# Patient Record
Sex: Female | Born: 1995 | Race: Black or African American | Hispanic: No | Marital: Single | Smoking: Current every day smoker
Health system: Southern US, Community
[De-identification: ages and names within clinical notes are randomized; demographics above are authoritative.]

## PROBLEM LIST (undated history)

## (undated) ENCOUNTER — Inpatient Hospital Stay (HOSPITAL_COMMUNITY): Payer: Self-pay

## (undated) ENCOUNTER — Ambulatory Visit: Admission: EM | Source: Home / Self Care

## (undated) DIAGNOSIS — Z8711 Personal history of peptic ulcer disease: Secondary | ICD-10-CM

## (undated) DIAGNOSIS — A549 Gonococcal infection, unspecified: Secondary | ICD-10-CM

## (undated) DIAGNOSIS — A749 Chlamydial infection, unspecified: Secondary | ICD-10-CM

## (undated) DIAGNOSIS — Z8719 Personal history of other diseases of the digestive system: Secondary | ICD-10-CM

## (undated) DIAGNOSIS — F419 Anxiety disorder, unspecified: Secondary | ICD-10-CM

## (undated) DIAGNOSIS — F329 Major depressive disorder, single episode, unspecified: Secondary | ICD-10-CM

## (undated) DIAGNOSIS — D649 Anemia, unspecified: Secondary | ICD-10-CM

## (undated) DIAGNOSIS — F32A Depression, unspecified: Secondary | ICD-10-CM

## (undated) DIAGNOSIS — Z789 Other specified health status: Secondary | ICD-10-CM

## (undated) HISTORY — DX: Depression, unspecified: F32.A

## (undated) HISTORY — PX: FRACTURE SURGERY: SHX138

## (undated) HISTORY — DX: Anemia, unspecified: D64.9

## (undated) HISTORY — DX: Anxiety disorder, unspecified: F41.9

## (undated) HISTORY — DX: Personal history of peptic ulcer disease: Z87.11

## (undated) HISTORY — DX: Major depressive disorder, single episode, unspecified: F32.9

## (undated) HISTORY — DX: Personal history of other diseases of the digestive system: Z87.19

---
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
## 2016-01-14 ENCOUNTER — Encounter: Payer: Self-pay | Admitting: Obstetrics and Gynecology

## 2016-01-14 ENCOUNTER — Ambulatory Visit (INDEPENDENT_AMBULATORY_CARE_PROVIDER_SITE_OTHER): Payer: Medicaid Other | Admitting: Obstetrics and Gynecology

## 2016-01-14 VITALS — BP 134/73 | HR 79 | Temp 98.6°F | Ht 69.0 in | Wt 187.9 lb

## 2016-01-14 DIAGNOSIS — Z30432 Encounter for removal of intrauterine contraceptive device: Secondary | ICD-10-CM

## 2016-01-14 NOTE — Progress Notes (Signed)
Patient ID: Pamela Jefferson, female   DOB: 11/11/1995, 20 y.o.   MRN: [id number] 20 yo G1P1 here for IUD removal. Patient reports some vaginal irritation and cramping pains. She has a strong desire to have the IUD removed. The IUD has been in place for 1 year  IUD removal A speculum was placed in the vagina.  The cervix was visualized along with IUD strings extending 5 cm from the os.  The strings of the IUD were grasped with a ring Forceps for removal. Some resistance was noted. After a few attempts, the IUD was grasped and removed in its entirety. It appeared to have been located in the lower uterine segment. The patient tolerated the procedure well.  She will use condoms for contraception.  RTC prn

## 2016-01-14 NOTE — Patient Instructions (Signed)
Contraception Choices Contraception (birth control) is the use of any methods or devices to prevent pregnancy. Below are some methods to help avoid pregnancy. HORMONAL METHODS   Contraceptive implant. This is a thin, plastic tube containing progesterone hormone. It does not contain estrogen hormone. Your health care provider inserts the tube in the inner part of the upper arm. The tube can remain in place for up to 3 years. After 3 years, the implant must be removed. The implant prevents the ovaries from releasing an egg (ovulation), thickens the cervical mucus to prevent sperm from entering the uterus, and thins the lining of the inside of the uterus.  Progesterone-only injections. These injections are given every 3 months by your health care provider to prevent pregnancy. This synthetic progesterone hormone stops the ovaries from releasing eggs. It also thickens cervical mucus and changes the uterine lining. This makes it harder for sperm to survive in the uterus.  Birth control pills. These pills contain estrogen and progesterone hormone. They work by preventing the ovaries from releasing eggs (ovulation). They also cause the cervical mucus to thicken, preventing the sperm from entering the uterus. Birth control pills are prescribed by a health care provider.Birth control pills can also be used to treat heavy periods.  Minipill. This type of birth control pill contains only the progesterone hormone. They are taken every day of each month and must be prescribed by your health care provider.  Birth control patch. The patch contains hormones similar to those in birth control pills. It must be changed once a week and is prescribed by a health care provider.  Vaginal ring. The ring contains hormones similar to those in birth control pills. It is left in the vagina for 3 weeks, removed for 1 week, and then a new one is put back in place. The patient must be comfortable inserting and removing the ring  from the vagina.A health care provider's prescription is necessary.  Emergency contraception. Emergency contraceptives prevent pregnancy after unprotected sexual intercourse. This pill can be taken right after sex or up to 5 days after unprotected sex. It is most effective the sooner you take the pills after having sexual intercourse. Most emergency contraceptive pills are available without a prescription. Check with your pharmacist. Do not use emergency contraception as your only form of birth control. BARRIER METHODS   Female condom. This is a thin sheath (latex or rubber) that is worn over the penis during sexual intercourse. It can be used with spermicide to increase effectiveness.  Female condom. This is a soft, loose-fitting sheath that is put into the vagina before sexual intercourse.  Diaphragm. This is a soft, latex, dome-shaped barrier that must be fitted by a health care provider. It is inserted into the vagina, along with a spermicidal jelly. It is inserted before intercourse. The diaphragm should be left in the vagina for 6 to 8 hours after intercourse.  Cervical cap. This is a round, soft, latex or plastic cup that fits over the cervix and must be fitted by a health care provider. The cap can be left in place for up to 48 hours after intercourse.  Sponge. This is a soft, circular piece of polyurethane foam. The sponge has spermicide in it. It is inserted into the vagina after wetting it and before sexual intercourse.  Spermicides. These are chemicals that kill or block sperm from entering the cervix and uterus. They come in the form of creams, jellies, suppositories, foam, or tablets. They do not require a   prescription. They are inserted into the vagina with an applicator before having sexual intercourse. The process must be repeated every time you have sexual intercourse. INTRAUTERINE CONTRACEPTION  Intrauterine device (IUD). This is a T-shaped device that is put in a woman's uterus  during a menstrual period to prevent pregnancy. There are 2 types:  Copper IUD. This type of IUD is wrapped in copper wire and is placed inside the uterus. Copper makes the uterus and fallopian tubes produce a fluid that kills sperm. It can stay in place for 10 years.  Hormone IUD. This type of IUD contains the hormone progestin (synthetic progesterone). The hormone thickens the cervical mucus and prevents sperm from entering the uterus, and it also thins the uterine lining to prevent implantation of a fertilized egg. The hormone can weaken or kill the sperm that get into the uterus. It can stay in place for 3-5 years, depending on which type of IUD is used. PERMANENT METHODS OF CONTRACEPTION  Female tubal ligation. This is when the woman's fallopian tubes are surgically sealed, tied, or blocked to prevent the egg from traveling to the uterus.  Hysteroscopic sterilization. This involves placing a small coil or insert into each fallopian tube. Your doctor uses a technique called hysteroscopy to do the procedure. The device causes scar tissue to form. This results in permanent blockage of the fallopian tubes, so the sperm cannot fertilize the egg. It takes about 3 months after the procedure for the tubes to become blocked. You must use another form of birth control for these 3 months.  Female sterilization. This is when the female has the tubes that carry sperm tied off (vasectomy).This blocks sperm from entering the vagina during sexual intercourse. After the procedure, the man can still ejaculate fluid (semen). NATURAL PLANNING METHODS  Natural family planning. This is not having sexual intercourse or using a barrier method (condom, diaphragm, cervical cap) on days the woman could become pregnant.  Calendar method. This is keeping track of the length of each menstrual cycle and identifying when you are fertile.  Ovulation method. This is avoiding sexual intercourse during ovulation.  Symptothermal  method. This is avoiding sexual intercourse during ovulation, using a thermometer and ovulation symptoms.  Post-ovulation method. This is timing sexual intercourse after you have ovulated. Regardless of which type or method of contraception you choose, it is important that you use condoms to protect against the transmission of sexually transmitted infections (STIs). Talk with your health care provider about which form of contraception is most appropriate for you.   This information is not intended to replace advice given to you by your health care provider. Make sure you discuss any questions you have with your health care provider.   Document Released: 09/05/2005 Document Revised: 09/10/2013 Document Reviewed: 02/28/2013 Elsevier Interactive Patient Education 2016 [employer]  

## 2016-01-19 ENCOUNTER — Encounter (HOSPITAL_COMMUNITY): Payer: Self-pay | Admitting: Emergency Medicine

## 2016-01-19 ENCOUNTER — Emergency Department (HOSPITAL_COMMUNITY)
Admission: EM | Admit: 2016-01-19 | Discharge: 2016-01-20 | Disposition: A | Discharge status: Home / Self Care | Payer: Medicaid Other | Attending: Emergency Medicine | Admitting: Emergency Medicine

## 2016-01-19 DIAGNOSIS — Z046 Encounter for general psychiatric examination, requested by authority: Secondary | ICD-10-CM | POA: Insufficient documentation

## 2016-01-19 DIAGNOSIS — R45851 Suicidal ideations: Secondary | ICD-10-CM | POA: Insufficient documentation

## 2016-01-19 DIAGNOSIS — F329 Major depressive disorder, single episode, unspecified: Secondary | ICD-10-CM | POA: Insufficient documentation

## 2016-01-19 DIAGNOSIS — Z791 Long term (current) use of non-steroidal anti-inflammatories (NSAID): Secondary | ICD-10-CM | POA: Insufficient documentation

## 2016-01-19 DIAGNOSIS — F322 Major depressive disorder, single episode, severe without psychotic features: Secondary | ICD-10-CM | POA: Diagnosis present

## 2016-01-19 DIAGNOSIS — F32A Depression, unspecified: Secondary | ICD-10-CM

## 2016-01-19 DIAGNOSIS — F332 Major depressive disorder, recurrent severe without psychotic features: Secondary | ICD-10-CM | POA: Diagnosis not present

## 2016-01-19 LAB — CBC
HEMATOCRIT: 37.6 % (ref 36.0–46.0)
Hemoglobin: 12.5 g/dL (ref 12.0–15.0)
MCH: 29.6 pg (ref 26.0–34.0)
MCHC: 33.2 g/dL (ref 30.0–36.0)
MCV: 89.1 fL (ref 78.0–100.0)
Platelets: 435 10*3/uL — ABNORMAL HIGH (ref 150–400)
RBC: 4.22 MIL/uL (ref 3.87–5.11)
RDW: 14.1 % (ref 11.5–15.5)
WBC: 10.7 10*3/uL — ABNORMAL HIGH (ref 4.0–10.5)

## 2016-01-19 LAB — COMPREHENSIVE METABOLIC PANEL
ALBUMIN: 4.8 g/dL (ref 3.5–5.0)
ALT: 12 U/L — AB (ref 14–54)
AST: 15 U/L (ref 15–41)
Alkaline Phosphatase: 91 U/L (ref 38–126)
Anion gap: 12 (ref 5–15)
BILIRUBIN TOTAL: 0.7 mg/dL (ref 0.3–1.2)
BUN: 18 mg/dL (ref 6–20)
CHLORIDE: 106 mmol/L (ref 101–111)
CO2: 21 mmol/L — ABNORMAL LOW (ref 22–32)
CREATININE: 0.57 mg/dL (ref 0.44–1.00)
Calcium: 9.8 mg/dL (ref 8.9–10.3)
GFR calc Af Amer: >60 mL/min (ref >60)
GLUCOSE: 96 mg/dL (ref 65–99)
Potassium: 3.8 mmol/L (ref 3.5–5.1)
Sodium: 139 mmol/L (ref 135–145)
Total Protein: 8.5 g/dL — ABNORMAL HIGH (ref 6.5–8.1)

## 2016-01-19 LAB — ETHANOL

## 2016-01-19 LAB — RAPID URINE DRUG SCREEN, HOSP PERFORMED
Amphetamines: NOT DETECTED
BARBITURATES: NOT DETECTED
Benzodiazepines: NOT DETECTED
COCAINE: NOT DETECTED
Opiates: NOT DETECTED
TETRAHYDROCANNABINOL: POSITIVE — AB

## 2016-01-19 LAB — PREGNANCY, URINE: PREG TEST UR: NEGATIVE

## 2016-01-19 MED ORDER — LORAZEPAM 2 MG/ML IJ SOLN
1.0000 mg | Freq: Once | INTRAMUSCULAR | Status: AC
  Administered 2016-01-19: 1 mg via INTRAMUSCULAR
  Filled 2016-01-19: qty 1

## 2016-01-19 MED ORDER — HALOPERIDOL LACTATE 5 MG/ML IJ SOLN
5.0000 mg | Freq: Once | INTRAMUSCULAR | Status: AC
  Administered 2016-01-19: 5 mg via INTRAMUSCULAR
  Filled 2016-01-19: qty 1

## 2016-01-19 MED ORDER — DIPHENHYDRAMINE HCL 50 MG/ML IJ SOLN
12.5000 mg | Freq: Once | INTRAMUSCULAR | Status: AC
Start: 2016-01-19 — End: 2016-01-19
  Administered 2016-01-19: 12.5 mg via INTRAMUSCULAR
  Filled 2016-01-19: qty 1

## 2016-01-19 NOTE — ED Notes (Signed)
Pt resting at present, no distress noted. Respirations even and unlabored.  Skin color good.  Monitoring for safety, Q 15 min checks in effect.

## 2016-01-19 NOTE — ED Notes (Signed)
[facility] nurse unavailable and is currently with a patient

## 2016-01-19 NOTE — ED Notes (Signed)
[id number] Expected date:  Expected time:  Means of arrival:  Comments: [place]

## 2016-01-19 NOTE — ED Notes (Signed)
On admission pt became very upset about being here. She demanded to be released. She screamed and yelled that she will not stay. She ran to the exit doors and hit them repeatedly with her fist. A show of support of staff did not calm her down. Pt status changed to IVC and medications given emergently to help her calm down and stop her from hurting herself or others. After the medications were given she calmed down and called her family.

## 2016-01-19 NOTE — ED Notes (Addendum)
[employer] got a call saying pt was threatening suicide by hanging. Pt was on the couch with wires next to her. Voluntary currently. Pt c/o being overwhelmed trying to take care of her 19 year old and may also currently be pregnant.  Pt states she doesn't know if she's pregnant at the moment. Suicidal thoughts with plans of hanging. Denies homicidal. Denies any physical problems/pain.

## 2016-01-19 NOTE — ED Notes (Signed)
Spoke with Dr. Justin MendStinyl who is in the process of changing pt's status to Involuntary Commitment.

## 2016-01-19 NOTE — ED Provider Notes (Signed)
CSN: [id number]     Arrival date & time 01/19/16  1220 History   First MD Initiated Contact with Patient 01/19/16 1255     No chief complaint on file.    (Consider location/radiation/quality/duration/timing/severity/associated sxs/prior Treatment) The history is provided by the patient.  Patient c/o feeling depressed, with thoughts of suicide.  Patient notes plan of using wires from her car to hang herself. Denies any attempt at self harm.  Patient called for help today, arrives via ems.  Denies current tx for same. Denies any prescription or otc med use.  Patient indicates is feeling overwhelmed with caring for her 19 year old, and being afraid she may be pregnant again.  States had iud removed 1 month ago. No abd pain. No vaginal discharge or bleeding. Denies any current physical symptoms/illness. +trouble sleeping at night. Is eating/drinking. No wt loss. Denies etoh or drug use.       Past Medical History  Diagnosis Date  . Medical history non-contributory   . Gonorrhea   . Chlamydia    Past Surgical History  Procedure Laterality Date  . Fracture surgery      left arm   No family history on file. Social History  Substance Use Topics  . Smoking status: Never Smoker   . Smokeless tobacco: Never Used  . Alcohol Use: No   OB History    Gravida Para Term Preterm AB TAB SAB Ectopic Multiple Living   0 1     Review of Systems  Constitutional: Negative for fever.  HENT: Negative for sore throat.   Eyes: Negative for redness.  Respiratory: Negative for shortness of breath.   Cardiovascular: Negative for chest pain.  Gastrointestinal: Negative for vomiting and abdominal pain.  Genitourinary: Negative for flank pain.  Musculoskeletal: Negative for back pain and neck pain.  Skin: Negative for rash.  Neurological: Negative for headaches.  Hematological: Does not bruise/bleed easily.  Psychiatric/Behavioral: Positive for suicidal ideas and dysphoric mood.       Allergies  Shellfish allergy  Home Medications   Prior to Admission medications   Medication Sig Start Date End Date Taking? Authorizing Provider  ibuprofen (ADVIL,MOTRIN) 600 MG tablet Take 1 tablet (600 mg total) by mouth every 6 (six) hours as needed. Patient not taking: Reported on 01/19/2016 11/18/15   Mady Gemma, PA-C   BP 117/73 mmHg  Pulse 85  Temp(Src) 98.5 F (36.9 C) (Oral)  Resp 16  Ht  (1.727 m)  Wt 84.823 kg  BMI 28.44 kg/m2  SpO2 100%  LMP 12/28/2015 Physical Exam  Constitutional: She is oriented to person, place, and time. She appears well-developed and well-nourished. No distress.  HENT:  Head: Atraumatic.  Mouth/Throat: Oropharynx is clear and moist.  Eyes: Conjunctivae are normal. Pupils are equal, round, and reactive to light. No scleral icterus.  Neck: Neck supple. No tracheal deviation present. No thyromegaly present.  Cardiovascular: Normal rate, regular rhythm, normal heart sounds and intact distal pulses.   No murmur heard. Pulmonary/Chest: Effort normal and breath sounds normal. No respiratory distress.  Abdominal: Soft. Normal appearance and bowel sounds are normal. She exhibits no distension. There is no tenderness.  Musculoskeletal: She exhibits no edema.  Neurological: She is alert and oriented to person, place, and time.  Steady gait.   Skin: Skin is warm and dry. No rash noted. She is not diaphoretic.  Psychiatric:  Depressed mood. +SI.    Nursing note and vitals reviewed.  ED Course  Procedures (including critical care time) Labs Review  Results for orders placed or performed during the hospital encounter of 01/19/16  Comprehensive metabolic panel  Result Value Ref Range   Sodium 139 135 - 145 mmol/L   Potassium 3.8 3.5 - 5.1 mmol/L   Chloride 106 101 - 111 mmol/L   CO2 21 (L) 22 - 32 mmol/L   Glucose, Bld 96 65 - 99 mg/dL   BUN 18 6 - 20 mg/dL   Creatinine, Ser 1.610.57 0.44 - 1.00 mg/dL   Calcium 9.8 8.9 -  09.610.3 mg/dL   Total Protein 8.5 (H) 6.5 - 8.1 g/dL   Albumin 4.8 3.5 - 5.0 g/dL   AST 15 15 - 41 U/L   ALT 12 (L) 14 - 54 U/L   Alkaline Phosphatase 91 38 - 126 U/L   Total Bilirubin 0.7 0.3 - 1.2 mg/dL   GFR calc non Af Amer >60 >60 mL/min   GFR calc Af Amer >60 >60 mL/min   Anion gap 12 5 - 15  Ethanol  Result Value Ref Range   Alcohol, Ethyl (B) <5 <5 mg/dL  CBC  Result Value Ref Range   WBC 10.7 (H) 4.0 - 10.5 K/uL   RBC 4.22 3.87 - 5.11 MIL/uL   Hemoglobin 12.5 12.0 - 15.0 g/dL   HCT 04.537.6 40.936.0 - 81.146.0 %   MCV 89.1 78.0 - 100.0 fL   MCH 29.6 26.0 - 34.0 pg   MCHC 33.2 30.0 - 36.0 g/dL   RDW 91.414.1 78.211.5 - 95.615.5 %   Platelets 435 (H) 150 - 400 K/uL  Urine rapid drug screen (hosp performed)  Result Value Ref Range   Opiates NONE DETECTED NONE DETECTED   Cocaine NONE DETECTED NONE DETECTED   Benzodiazepines NONE DETECTED NONE DETECTED   Amphetamines NONE DETECTED NONE DETECTED   Tetrahydrocannabinol POSITIVE (A) NONE DETECTED   Barbiturates NONE DETECTED NONE DETECTED  Pregnancy, urine  Result Value Ref Range   Preg Test, Ur NEGATIVE NEGATIVE      I have personally reviewed and evaluated these lab results as part of my medical decision-making.    MDM    Labs.  [employer]/patient verify her 19 yr old is with her mother/being care for appropriately.   Reviewed nursing notes and prior charts for additional history.   Labs reviewed.  Patient appears medically clear for psych eval.  [facility]   [facility] eval remains pending[facility]  [facility] - anticipate will need psych admission.      Cathren LaineKevin Tyja Gortney, MD 01/19/16 [id number]

## 2016-01-19 NOTE — ED Notes (Signed)
First attempt made to call report to [facility], but nurse currently unavailable

## 2016-01-19 NOTE — BH Assessment (Signed)
Assessment Note  Pamela Jefferson is an 20 y.o. female. Patient reports suicidal thoughts with plan to hang herself. Patient plans to take the cable cords from her car. She then plans to go inside her closet and hang herself. Patient denies previous attempts or gestures to harm self. No self mutilating behaviors. Patients stressors include job loss due to not having a sitter for her child and missing to many days, raising a 19 yr old with not support, financial issues, and conflict with boyfriend. Patient reports increased depression for the past 2-3 days. She reports symptoms of crying spells, anger, isolating from others, and hopelessness. Patient denies HI and AVH's. Patient calm and cooperative. No legal issues. Patient denies alcohol use. She also denies drug use but UDS is + for THC. Patient denies history of inpatient and outpatient psychiatric treatment.      Diagnosis: Major Depressive Disorder, Recurrent, Severe, without psychotic features  Past Medical History:  Past Medical History  Diagnosis Date  . Medical history non-contributory   . Gonorrhea   . Chlamydia     Past Surgical History  Procedure Laterality Date  . Fracture surgery      left arm    Family History: History reviewed. No pertinent family history.  Social History:  reports that she has never smoked. She has never used smokeless tobacco. She reports that she does not drink alcohol or use illicit drugs.  Additional Social History:  Alcohol / Drug Use Pain Medications: SEE MAR Prescriptions: SEE MAR Over the Counter: SEE MAR History of alcohol / drug use?: Yes Substance #1 Name of Substance 1: THC (patient denies use; UDS + for THC) 1 - Age of First Use: unk 1 - Amount (size/oz): unk 1 - Frequency: unk 1 - Duration: unk 1 - Last Use / Amount: unk  CIWA: CIWA-Ar BP: 117/73 mmHg Pulse Rate: 85 COWS:    Allergies:  Allergies  Allergen Reactions  . Shellfish Allergy Anaphylaxis    Home Medications:   (Not in a hospital admission)  OB/GYN Status:  Patient's last menstrual period was 12/28/2015.  General Assessment Data Location of Assessment: [facility] TTS Assessment: In system Is this a Tele or Face-to-Face Assessment?: Face-to-Face Is this an Initial Assessment or a Re-assessment for this encounter?: Initial Assessment Marital status: Single Maiden name:  (n/a) Is patient pregnant?: No Pregnancy Status: No Living Arrangements: Other (Comment), Spouse/significant other, Children (with boyfriend and 67 year old son) Can pt return to current living arrangement?: Yes Admission Status: Involuntary Is patient capable of signing voluntary admission?: Yes Referral Source: Self/Family/Friend Insurance type:  (Medicaid )     Crisis Care Plan Living Arrangements: Other (Comment), Spouse/significant other, Children (with boyfriend and 12 year old son) Legal Guardian:  (no guardian ) Name of Psychiatrist:  (no psychiatrist ) Name of Therapist:  (no therapist )  Education Status Is patient currently in school?: No Current Grade:  (n/a) Highest grade of school patient has completed:  (HS) Name of school:  (n/a) Contact person:  (n/a)  Risk to self with the past 6 months Suicidal Ideation: Yes-Currently Present Has patient been a risk to self within the past 6 months prior to admission? : Yes Suicidal Intent: Yes-Currently Present Has patient had any suicidal intent within the past 6 months prior to admission? : Yes Is patient at risk for suicide?: Yes Suicidal Plan?: Yes-Currently Present Has patient had any suicidal plan within the past 6 months prior to admission? : No Specify Current Suicidal Plan:  (  hang self in closet by using cords under her cars hood) Access to Means: Yes Specify Access to Suicidal Means:  ("I will use the cords under the hood of my car") What has been your use of drugs/alcohol within the last 12 months?:  (denies; uds + for thc ) Previous Attempts/Gestures:  Yes How many times?:  (patient denies previous attempts or gestures ) Other Self Harm Risks:  (patient denies ) Triggers for Past Attempts:  (denies ) Intentional Self Injurious Behavior: None Family Suicide History: No Recent stressful life event(s): Divorce, Loss (Comment), Financial Problems, Legal Issues, Trauma (Comment), Turmoil (Comment), Other (Comment), Job Loss, Recent negative physical changes, Conflict (Comment) Persecutory voices/beliefs?: No Depression: Yes Depression Symptoms: Feeling angry/irritable, Feeling worthless/self pity, Loss of interest in usual pleasures, Guilt, Fatigue, Isolating, Tearfulness, Insomnia, Despondent Substance abuse history and/or treatment for substance abuse?: No Suicide prevention information given to non-admitted patients: Not applicable  Risk to Others within the past 6 months Homicidal Ideation: No Does patient have any lifetime risk of violence toward others beyond the six months prior to admission? : No Thoughts of Harm to Others: No Current Homicidal Intent: No Current Homicidal Plan: No Access to Homicidal Means: No Identified Victim:  (n/a) History of harm to others?: No Assessment of Violence: None Noted Violent Behavior Description:  (patient is calm and cooperative ) Does patient have access to weapons?: No Criminal Charges Pending?: No Does patient have a court date: No Is patient on probation?: No  Psychosis Hallucinations: None noted Delusions: None noted  Mental Status Report Appearance/Hygiene: In scrubs Eye Contact: Fair Motor Activity: Agitation Speech: Logical/coherent Level of Consciousness: Alert Mood: Depressed Affect: Depressed Anxiety Level: None Thought Processes: Coherent, Relevant Judgement: Impaired Orientation: Person, Place, Time, Situation Obsessive Compulsive Thoughts/Behaviors: None  Cognitive Functioning Concentration: Decreased Memory: Recent Intact, Remote Intact IQ: Average Insight:  Poor Impulse Control: Poor Appetite: Poor Weight Loss:  (varies ) Weight Gain:  (varies ) Sleep: Decreased Total Hours of Sleep:  (varies ) Vegetative Symptoms: None  ADLScreening [facility] Assessment Services) Patient's cognitive ability adequate to safely complete daily activities?: Yes Patient able to express need for assistance with ADLs?: Yes Independently performs ADLs?: Yes (appropriate for developmental age)  Prior Inpatient Therapy Prior Inpatient Therapy: No Prior Therapy Dates:  (n/a) Prior Therapy Facilty/Provider(s):  (n/a) Reason for Treatment:  (n/a)  Prior Outpatient Therapy Prior Outpatient Therapy: No Prior Therapy Dates:  (n/a) Prior Therapy Facilty/Provider(s):  (n/a) Reason for Treatment:  (n/a) Does patient have an ACCT team?: No Does patient have Intensive In-House Services?  : No Does patient have [facility] services? : No Does patient have P4CC services?: No  ADL Screening (condition at time of admission) Patient's cognitive ability adequate to safely complete daily activities?: Yes Is the patient deaf or have difficulty hearing?: No Does the patient have difficulty seeing, even when wearing glasses/contacts?: No Does the patient have difficulty concentrating, remembering, or making decisions?: No Patient able to express need for assistance with ADLs?: Yes Does the patient have difficulty dressing or bathing?: No Independently performs ADLs?: Yes (appropriate for developmental age) Does the patient have difficulty walking or climbing stairs?: No Weakness of Legs: None Weakness of Arms/Hands: None  Home Assistive Devices/Equipment Home Assistive Devices/Equipment: None    Abuse/Neglect Assessment (Assessment to be complete while patient is alone) Physical Abuse: Denies Verbal Abuse: Denies Sexual Abuse: Denies Exploitation of patient/patient's resources: Denies Self-Neglect: Denies Values / Beliefs Cultural Requests During Hospitalization:  None Spiritual Requests During Hospitalization: None  Advance Directives (For Healthcare) Does patient have an advance directive?: No Would patient like information on creating an advanced directive?: No - patient declined information Nutrition Screen- [facility] Patient's home diet: Regular  Additional Information 1:1 In Past 12 Months?: No CIRT Risk: No Elopement Risk: No Does patient have medical clearance?: No     Disposition:  Disposition Initial Assessment Completed for this Encounter: Yes Disposition of Patient: Inpatient treatment program (Patient meets criteria for INPT admission, per Julieanne CottonJosephine NP) Type of inpatient treatment program: Adult  On Site Evaluation by:   Reviewed with Physician:    Melynda RipplePerry, Brendyn [facility] 01/19/2016 5:37 PM

## 2016-01-20 ENCOUNTER — Encounter (HOSPITAL_COMMUNITY): Payer: Self-pay

## 2016-01-20 ENCOUNTER — Inpatient Hospital Stay (HOSPITAL_COMMUNITY)
Admission: AD | Admit: 2016-01-20 | Discharge: 2016-01-23 | DRG: 885 | Disposition: A | Discharge status: Home / Self Care | Payer: Medicaid Other | Source: Intra-hospital | Attending: Psychiatry | Admitting: Psychiatry

## 2016-01-20 DIAGNOSIS — G4700 Insomnia, unspecified: Secondary | ICD-10-CM | POA: Diagnosis present

## 2016-01-20 DIAGNOSIS — Z91013 Allergy to seafood: Secondary | ICD-10-CM | POA: Diagnosis not present

## 2016-01-20 DIAGNOSIS — F322 Major depressive disorder, single episode, severe without psychotic features: Secondary | ICD-10-CM | POA: Diagnosis present

## 2016-01-20 DIAGNOSIS — F419 Anxiety disorder, unspecified: Secondary | ICD-10-CM | POA: Diagnosis present

## 2016-01-20 DIAGNOSIS — R45851 Suicidal ideations: Secondary | ICD-10-CM | POA: Diagnosis present

## 2016-01-20 DIAGNOSIS — F332 Major depressive disorder, recurrent severe without psychotic features: Secondary | ICD-10-CM

## 2016-01-20 DIAGNOSIS — F329 Major depressive disorder, single episode, unspecified: Secondary | ICD-10-CM | POA: Diagnosis not present

## 2016-01-20 MED ORDER — TRAZODONE HCL 50 MG PO TABS: 50.0000 mg | ORAL_TABLET | Freq: Every day | ORAL | Status: DC

## 2016-01-20 MED ORDER — FLUOXETINE HCL 10 MG PO CAPS: 10.0000 mg | ORAL_CAPSULE | Freq: Every day | ORAL | Status: DC

## 2016-01-20 MED ORDER — FLUOXETINE HCL 10 MG PO CAPS
10.0000 mg | ORAL_CAPSULE | Freq: Every day | ORAL | Status: DC
  Administered 2016-01-21 – 2016-01-22 (×2): 10 mg via ORAL
  Filled 2016-01-20 (×4): qty 1

## 2016-01-20 MED ORDER — HYDROXYZINE HCL 25 MG PO TABS: 25.0000 mg | ORAL_TABLET | Freq: Three times a day (TID) | ORAL | Status: DC | PRN

## 2016-01-20 MED ORDER — TRAZODONE HCL 50 MG PO TABS
50.0000 mg | ORAL_TABLET | Freq: Every day | ORAL | Status: DC
  Administered 2016-01-20 – 2016-01-22 (×3): 50 mg via ORAL
  Filled 2016-01-20 (×6): qty 1

## 2016-01-20 MED ORDER — HYDROXYZINE HCL 25 MG PO TABS
25.0000 mg | ORAL_TABLET | Freq: Three times a day (TID) | ORAL | Status: DC | PRN
  Administered 2016-01-22: 25 mg via ORAL
  Filled 2016-01-20: qty 1

## 2016-01-20 NOTE — Tx Team (Signed)
Initial Interdisciplinary Treatment Plan   PATIENT STRESSORS: Financial difficulties Marital or family conflict Recent move to the area   PATIENT STRENGTHS: Capable of independent living Communication skills Physical Health   PROBLEM LIST: Problem List/Patient Goals Date to be addressed Date deferred Reason deferred Estimated date of resolution  Depression 01/20/16     Suicide attempt 01/20/16     Anxiety 01/20/16     "Learn ways to help me not stress out so much " 01/20/16     "Feel better about myself " 01/20/16                              DISCHARGE CRITERIA:  Improved stabilization in mood, thinking, and/or behavior Verbal commitment to aftercare and medication compliance  PRELIMINARY DISCHARGE PLAN: Outpatient therapy Medication management  PATIENT/FAMIILY INVOLVEMENT: This treatment plan has been presented to and reviewed with the patient, Pamela Jefferson.  The patient and family have been given the opportunity to ask questions and make suggestions.  Norm ParcelHeather V Berma Harts 01/20/2016, 2:19 PM

## 2016-01-20 NOTE — Consult Note (Signed)
[facility] Face-to-Face Psychiatry Consult   Reason for Consult:  Suicide ideation, Suicide attempt by report Referring Physician:  EDP Patient Identification: Pamela Jefferson MRN:  [id number] Principal Diagnosis: Severe major depression without psychotic features [facility]) Diagnosis:   Patient Active Problem List   Diagnosis Date Noted  . Severe major depression without psychotic features ([place]) [F32.2] 01/20/2016    Total Time spent with patient: 45 minutes  Subjective:   Pamela Jefferson is a 20 y.o. female patient admitted with  Suicide ideation, Suicide attempt.  HPI:  AA female, 20 years old was evaluated with the c/o suicidal ideation with plans to hang her self.  She was IVC by the Psychiatrist as she want to leave.  Patient on arrival to the ER stated that she had attempted to kill her self by hanging the previous week.  She stated that she has some stressors related to her family members.  Patient was tearful and did not want to ne admitted.  Based on report of previous suicide attempt and calling the Police stating she wanted to hang herself she was accepted for admission.  Past Psychiatric History: denies  Risk to Self: Suicidal Ideation: Yes-Currently Present Suicidal Intent: Yes-Currently Present Is patient at risk for suicide?: Yes Suicidal Plan?: Yes-Currently Present Specify Current Suicidal Plan:  (hang self in closet by using cords under her cars hood) Access to Means: Yes Specify Access to Suicidal Means:  ("I will use the cords under the hood of my car") What has been your use of drugs/alcohol within the last 12 months?:  (denies; uds + for thc ) How many times?:  (patient denies previous attempts or gestures ) Other Self Harm Risks:  (patient denies ) Triggers for Past Attempts:  (denies ) Intentional Self Injurious Behavior: None Risk to Others: Homicidal Ideation: No Thoughts of Harm to Others: No Current Homicidal Intent: No Current Homicidal Plan: No Access to  Homicidal Means: No Identified Victim:  (n/a) History of harm to others?: No Assessment of Violence: None Noted Violent Behavior Description:  (patient is calm and cooperative ) Does patient have access to weapons?: No Criminal Charges Pending?: No Does patient have a court date: No Prior Inpatient Therapy: Prior Inpatient Therapy: No Prior Therapy Dates:  (n/a) Prior Therapy Facilty/Provider(s):  (n/a) Reason for Treatment:  (n/a) Prior Outpatient Therapy: Prior Outpatient Therapy: No Prior Therapy Dates:  (n/a) Prior Therapy Facilty/Provider(s):  (n/a) Reason for Treatment:  (n/a) Does patient have an ACCT team?: No Does patient have Intensive In-House Services?  : No Does patient have [facility] services? : No Does patient have P4CC services?: No  Past Medical History:  Past Medical History  Diagnosis Date  . Medical history non-contributory   . Gonorrhea   . Chlamydia     Past Surgical History  Procedure Laterality Date  . Fracture surgery      left arm   Family History: History reviewed. No pertinent family history.   Family Psychiatric  History: denies Social History:  History  Alcohol Use No     History  Drug Use No    Social History   Social History  . Marital Status: Single    Spouse Name: N/A  . Number of Children: N/A  . Years of Education: N/A   Social History Main Topics  . Smoking status: Never Smoker   . Smokeless tobacco: Never Used  . Alcohol Use: No  . Drug Use: No  . Sexual Activity: Yes    Birth Control/ Protection: None, IUD  Other Topics Concern  . None   Social History Narrative   Additional Social History:    Allergies:   Allergies  Allergen Reactions  . Shellfish Allergy Anaphylaxis    Labs:  Results for orders placed or performed during the hospital encounter of 01/19/16 (from the past 48 hour(s))  Urine rapid drug screen (hosp performed)     Status: Abnormal   Collection Time: 01/19/16  1:39 PM  Result Value Ref  Range   Opiates NONE DETECTED NONE DETECTED   Cocaine NONE DETECTED NONE DETECTED   Benzodiazepines NONE DETECTED NONE DETECTED   Amphetamines NONE DETECTED NONE DETECTED   Tetrahydrocannabinol POSITIVE (A) NONE DETECTED   Barbiturates NONE DETECTED NONE DETECTED    Comment:        DRUG SCREEN FOR MEDICAL PURPOSES ONLY.  IF CONFIRMATION IS NEEDED FOR ANY PURPOSE, NOTIFY LAB WITHIN 5 DAYS.        LOWEST DETECTABLE LIMITS FOR URINE DRUG SCREEN Drug Class       Cutoff (ng/mL) Amphetamine      1000 Barbiturate      200 Benzodiazepine   161 Tricyclics       096 Opiates          300 Cocaine          300 THC              50   Pregnancy, urine     Status: None   Collection Time: 01/19/16  1:39 PM  Result Value Ref Range   Preg Test, Ur NEGATIVE NEGATIVE    Comment:        THE SENSITIVITY OF THIS METHODOLOGY IS >20 mIU/mL.   Comprehensive metabolic panel     Status: Abnormal   Collection Time: 01/19/16  1:44 PM  Result Value Ref Range   Sodium 139 135 - 145 mmol/L   Potassium 3.8 3.5 - 5.1 mmol/L   Chloride 106 101 - 111 mmol/L   CO2 21 (L) 22 - 32 mmol/L   Glucose, Bld 96 65 - 99 mg/dL   BUN 18 6 - 20 mg/dL   Creatinine, Ser 0.57 0.44 - 1.00 mg/dL   Calcium 9.8 8.9 - 10.3 mg/dL   Total Protein 8.5 (H) 6.5 - 8.1 g/dL   Albumin 4.8 3.5 - 5.0 g/dL   AST 15 15 - 41 U/L   ALT 12 (L) 14 - 54 U/L   Alkaline Phosphatase 91 38 - 126 U/L   Total Bilirubin 0.7 0.3 - 1.2 mg/dL   GFR calc non Af Amer >60 >60 mL/min   GFR calc Af Amer >60 >60 mL/min    Comment: (NOTE) The eGFR has been calculated using the CKD EPI equation. This calculation has not been validated in all clinical situations. eGFR's persistently <60 mL/min signify possible Chronic Kidney Disease.    Anion gap 12 5 - 15  Ethanol     Status: None   Collection Time: 01/19/16  1:44 PM  Result Value Ref Range   Alcohol, Ethyl (B) <5 <5 mg/dL    Comment:        LOWEST DETECTABLE LIMIT FOR SERUM ALCOHOL IS 5  mg/dL FOR MEDICAL PURPOSES ONLY   CBC     Status: Abnormal   Collection Time: 01/19/16  1:44 PM  Result Value Ref Range   WBC 10.7 (H) 4.0 - 10.5 K/uL   RBC 4.22 3.87 - 5.11 MIL/uL   Hemoglobin 12.5 12.0 - 15.0 g/dL   HCT 37.6 36.0 -  46.0 %   MCV 89.1 78.0 - 100.0 fL   MCH 29.6 26.0 - 34.0 pg   MCHC 33.2 30.0 - 36.0 g/dL   RDW 14.1 11.5 - 15.5 %   Platelets 435 (H) 150 - 400 K/uL    Current Facility-Administered Medications  Medication Dose Route Frequency Provider Last Rate Last Dose  . FLUoxetine (PROZAC) capsule 10 mg  10 mg Oral Daily Deaken Jurgens, MD   10 mg at 01/20/16 1225  . hydrOXYzine (ATARAX/VISTARIL) tablet 25 mg  25 mg Oral TID PRN Corena Pilgrim, MD      . traZODone (DESYREL) tablet 50 mg  50 mg Oral QHS Corena Pilgrim, MD       Current Outpatient Prescriptions  Medication Sig Dispense Refill  . ibuprofen (ADVIL,MOTRIN) 600 MG tablet Take 1 tablet (600 mg total) by mouth every 6 (six) hours as needed. (Patient not taking: Reported on 01/19/2016) 30 tablet 0    Musculoskeletal: Strength & Muscle Tone: within normal limits Gait & Station: normal Patient leans: N/A  Psychiatric Specialty Exam: Review of Systems  Constitutional: Negative.   Eyes: Negative.   Respiratory: Negative.   Cardiovascular: Negative.   Gastrointestinal: Negative.   Genitourinary: Negative.   Musculoskeletal: Negative.   Skin: Negative.   Neurological: Negative.   Endo/Heme/Allergies: Negative.     Blood pressure 128/68, pulse 99, temperature 98.6 F (37 C), temperature source Oral, resp. rate 18, height 5' 8"  (1.727 m), weight 84.823 kg (187 lb), last menstrual period 12/28/2015, SpO2 100 %, not currently breastfeeding.Body mass index is 28.44 kg/(m^2).  General Appearance: Casual and Fairly Groomed  Eye Contact::  Good  Speech:  Clear and Coherent and Normal Rate  Volume:  Normal  Mood:  Angry, Anxious and Irritable  Affect:  Congruent, Depressed, Flat and Tearful  Thought  Process:  Coherent, Goal Directed and Intact  Orientation:  Full (Time, Place, and Person)  Thought Content:  WDL  Suicidal Thoughts:  No  Homicidal Thoughts:  No  Memory:  Immediate;   Good Recent;   Good Remote;   Good  Judgement:  Poor  Insight:  Shallow  Psychomotor Activity:  Psychomotor Retardation  Concentration:  Fair  Recall:  NA  Fund of Knowledge:Poor  Language: Good  Akathisia:  NA  Handed:  Right  AIMS (if indicated):     Assets:  Desire for Improvement  ADL's:  Intact  Cognition: WNL  Sleep:      Treatment Plan Summary: Daily contact with patient to assess and evaluate symptoms and progress in treatment and Medication management  Disposition:  Accepted for admission and was transferred to [facility] Prozac 10 mg po daily for depression was started, Trazodone 50 mg po QHS and Hydroxyzine 25 mg po every 6 hours as needed for anxiety.  Delfin Gant, NP  PMHNP-BC 01/20/2016 1:00 PM Patient seen face-to-face for psychiatric evaluation, chart reviewed and case discussed with the physician extender and developed treatment plan. Reviewed the information documented and agree with the treatment plan. Corena Pilgrim, MD

## 2016-01-20 NOTE — Progress Notes (Signed)
Adult Psychoeducational Group Note  Date:  01/20/2016 Time:  9:28 PM  Group Topic/Focus:  Wrap-Up Group:   The focus of this group is to help patients review their daily goal of treatment and discuss progress on daily workbooks.  Participation Level:  Minimal  Participation Quality:  Appropriate  Affect:  Appropriate  Cognitive:  Alert  Insight: Appropriate  Engagement in Group:  Lacking  Modes of Intervention:  Discussion  Additional Comments:  Pt is new to the unit stated that her day was a 6/10. Her goal is to get better while she is here.   Kaleen OdeaCOOKE, Ronnett Pullin R 01/20/2016, 9:28 PM

## 2016-01-20 NOTE — Progress Notes (Signed)
D: Pt presents appropriate in affect and pleasant in mood. Pt presents with insight of her future. 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 Pt denies any SI/HI/AVH. Pt is visible and active within the milieu. Pt had no physical complaints.  A: Writer administered scheduled medications to pt, per MD orders. Continued support and availability as needed was extended to this pt. Staff continues to monitor pt with q7815min checks.  R: No adverse drug reactions noted. Pt receptive to treatment. Pt remains safe at this time.

## 2016-01-20 NOTE — BH Assessment (Signed)
[facility] Assessment Progress Note  Per Thedore MinsMojeed Akintayo, MD, this pt requires psychiatric hospitalization at this time.  Lillia AbedLindsay, RN, [facility]; pt is to be transferred at 13:30.  Pt presents under IVC initiated by EDP Cathren LaineKevin Steinl, MD; [facility]  Pt's nurse, Lincoln MaxinOlivette, has been notified, and agrees to call report to [phone].  Pt is to be transported via [employer] enforcement.  Doylene Canninghomas Terance Pomplun, MA Triage Specialist [phone]

## 2016-01-20 NOTE — Progress Notes (Signed)
Pamela Jefferson is an 20 year old female being admitted involuntarily to 400-[facility] from [facility].  She came to the ED with suicidal ideation with plan to hang self with the cable cords from her car.  She has had no previous suicide attempts.  Her stressors are job loss because of not having a sitter for her child and missing many days.  She is raising her 19 year old child alone with no financial support and conflict with her boyfriend.  She reports increasing depression over the past 3 days.  She denies current SI/HI or A/V hallucinations.  "I'm here because I just go so stressed out."  Admission paperwork completed and signed.  Belongings searched and secured in locker # 40 (Sneakers, cell phone, black purse, keys).  Skin assessment completed and no skin issues noted.  Q 15 minute checks initiated for safety.  We will monitor the progress towards her goals.

## 2016-01-21 ENCOUNTER — Encounter (HOSPITAL_COMMUNITY): Payer: Self-pay | Admitting: Psychiatry

## 2016-01-21 DIAGNOSIS — F32A Depression, unspecified: Secondary | ICD-10-CM | POA: Insufficient documentation

## 2016-01-21 DIAGNOSIS — F329 Major depressive disorder, single episode, unspecified: Secondary | ICD-10-CM

## 2016-01-21 DIAGNOSIS — R45851 Suicidal ideations: Secondary | ICD-10-CM

## 2016-01-21 NOTE — [facility] Suicide Risk Assessment (Signed)
[facility]   Nursing information obtained from:  Patient Demographic factors:  Adolescent or young adult, Unemployed Current Mental Status:  NA Loss Factors:  Financial problems / change in socioeconomic status Historical Factors:  Family history of mental illness or substance abuse, Impulsivity Risk Reduction Factors:  Responsible for children under 19 years of age, Living with another person, especially a relative  Total Time spent with patient: 45 minutes Principal Problem: suicidal ideations  Diagnosis:   Patient Active Problem List   Diagnosis Date Noted  . Depression [F32.9]   . Suicidal ideation [R45.851]   . Severe major depression without psychotic features ([facility]) [F32.2] 01/20/2016  . MDD (major depressive disorder), single episode, severe (HCC) [F32.2] 01/20/2016    Continued Clinical Symptoms:  Alcohol Use Disorder Identification Test Final Score (AUDIT): 3 The "Alcohol Use Disorders Identification Test", Guidelines for Use in Primary Care, Second Edition.  World Science writerHealth Organization [facility](WHO). Score between 0-7:  no or low risk or alcohol related problems. Score between 8-15:  moderate risk of alcohol related problems. Score between 16-19:  high risk of alcohol related problems. Score 20 or above:  warrants further diagnostic evaluation for alcohol dependence and treatment.   CLINICAL FACTORS:  20 year old female , reports recent onset suicidal ideations, with thoughts of hanging self. Denies prior psychiatric admissions or history of suicide attempts. Psychosocial stressors include being single mother, employed, with limited support system .     Psychiatric Specialty Exam: ROS  Blood pressure 100/54, pulse 96, temperature 98.8 F (37.1 C), temperature source Oral, resp. rate 16, height 5\' 9"  (1.753 m), weight 182 lb (82.555 kg), last menstrual period 12/28/2015, SpO2 100 %, not currently breastfeeding.Body mass index is 26.86 kg/(m^2).   see  admit note MSE  COGNITIVE FEATURES THAT CONTRIBUTE TO RISK:  Closed-mindedness and Loss of executive function    SUICIDE RISK:   Moderate:  Frequent suicidal ideation with limited intensity, and duration, some specificity in terms of plans, no associated intent, good self-control, limited dysphoria/symptomatology, some risk factors present, and identifiable protective factors, including available and accessible social support.  PLAN OF CARE: Patient will be admitted to inpatient psychiatric unit for stabilization and safety. Will provide and encourage milieu participation. Provide medication management and maked adjustments as needed.  Will follow daily.    I certify that inpatient services furnished can reasonably be expected to improve the patient's condition.   Nehemiah MassedOBOS, FERNANDO, MD 01/21/2016, 1:59 PM

## 2016-01-21 NOTE — [facility] Group Notes (Signed)
[facility] LCSW Group Therapy 01/21/2016 1:15 PM Type of Therapy: Group Therapy Participation Level: Active  Participation Quality: Attentive, Sharing and Supportive  Affect: Depressed and Flat  Cognitive: Alert and Oriented  Insight: Developing/Improving and Engaged  Engagement in Therapy: Developing/Improving and Engaged  Modes of Intervention: Activity, Clarification, Confrontation, Discussion, Education, Exploration, Limit-setting, Orientation, Problem-solving, Rapport Building, Reality Testing, Socialization and Support  Summary of Progress/Problems: Patient was attentive and engaged with speaker from [employer]. Patient was attentive to speaker while they shared their story of dealing with mental health and overcoming it. Patient expressed interest in their programs and services and received information on their agency. Patient processed ways they can relate to the speaker.   Sigurd Pugh, LCSW Clinical Social Worker [facility] [phone]   

## 2016-01-21 NOTE — Progress Notes (Signed)
D: Pt presents anxious on approach. Pt appears cautious and guarded during shift assessment. Pt forwarded little information to Clinical research associatewriter and denies depression and suicidal thoughts. Pt would not discuss feelings with Clinical research associatewriter and appeared animated. Pt appeared disheveled this morning. Pt compliant with taking meds. No adverse reaction to meds verbalized by pt.  A: Medications administered as ordered per MD. Medication reviewed with pt. Verbal support provided. Pt encouraged to attend groups. 15 minute checks performed for safety. R: Pt verbalized understanding of med regimen. Pt stated goal "go home to my son and family".

## 2016-01-21 NOTE — H&P (Signed)
Psychiatric Admission Assessment Adult  Patient Identification: Pamela Jefferson MRN:  [id number] Date of Evaluation:  01/21/2016 Chief Complaint:   " I got really depressed and stressed out " Principal Diagnosis: Suicidal Ideations Diagnosis:   Patient Active Problem List   Diagnosis Date Noted  . Depression [F32.9]   . Suicidal ideation [R45.851]   . Severe major depression without psychotic features ([place]) [F32.2] 01/20/2016  . MDD (major depressive disorder), single episode, severe ([place]) [F32.2] 01/20/2016   History of Present Illness:: 20 year old single female, reports worsening depression, feeling overwhelmed over recent days , and developed suicidal ideations, with thoughts of hanging self . She states she knew she needed help so called 911 and was brought to hospital. Reports significant stressors, being a single mother, working full time, trying to " keep up with the bills and everything I have to do ".  Associated Signs/Symptoms: Depression Symptoms:  depressed mood, recurrent thoughts of death, suicidal thoughts with specific plan,  Of note, minimizes anhedonia, or any significant changes in sleep, appetite, energy level  (Hypo) Manic Symptoms:  Denies  Anxiety Symptoms:  Denies excessive anxiety or panic attacks, denies agoraphobia. Psychotic Symptoms:  Denies  PTSD Symptoms: Denies  Total Time spent with patient: 45 minutes  Past Psychiatric History:  This is her first psychiatric admission, denies history of suicide attempts in the past, denies history of self cutting or self injurious ideations, denies history of psychosis, denies history of mania or hypomania, denies PTSD history. Denies history of eating disorder   Patient states she has never been on any psychiatric medications in the past  Is the patient at risk to self? Yes.    Has the patient been a risk to self in the past 6 months? No.  Has the patient been a risk to self within the distant past? No.  Is the  patient a risk to others? No.  Has the patient been a risk to others in the past 6 months? No.  Has the patient been a risk to others within the distant past? No.   Prior Inpatient Therapy:  denies  Prior Outpatient Therapy:  denies   Alcohol Screening: 1. How often do you have a drink containing alcohol?: 2 to 4 times a month 2. How many drinks containing alcohol do you have on a typical day when you are drinking?: 1 or 2 3. How often do you have six or more drinks on one occasion?: Less than monthly Preliminary Score: 1 9. Have you or someone else been injured as a result of your drinking?: No 10. Has a relative or friend or a doctor or another health worker been concerned about your drinking or suggested you cut down?: No Alcohol Use Disorder Identification Test Final Score (AUDIT): 3 Brief Intervention: AUDIT score less than 7 or less-screening does not suggest unhealthy drinking-brief intervention not indicated Substance Abuse History in the last 12 months:  Denies any alcohol or drug abuse   Consequences of Substance Abuse: Denies  Previous Psychotropic Medications:  No prior psychiatric medications  Psychological Evaluations:  No  Past Medical History: denies medical illnesses, NKDA Past Medical History  Diagnosis Date  . Medical history non-contributory   . Gonorrhea   . Chlamydia     Past Surgical History  Procedure Laterality Date  . Fracture surgery      left arm   Family History: Parents alive, separated, has 4 siblings Family Psychiatric  History: denies mental illness, denies substance abuse, denies suicides in  family  Tobacco Screening: does not smoke  Social History: single, has boyfriend, states she has a good relationship with him, has a one year old son, employed at a Engineer, drilling, denies legal issues, describes some financial difficulties   History  Alcohol Use No     History  Drug Use No    Additional Social History: Marital status: Long term  relationship Long term relationship, how long?: 8 months What types of issues is patient dealing with in the relationship?: boyfriend has different job w less income than previous job, wants patient to pay bills and is not able/willing to contribute financially as he did before Are you sexually active?: Yes What is your sexual orientation?: heterosexual Has your sexual activity been affected by drugs, alcohol, medication, or emotional stress?: no Does patient have children?: Yes How many children?: 1 How is patient's relationship with their children?: good, loves to be mother, "exciting, new things happen every day"; stressful due to son now walking, saying "no"    Pain Medications: SEE MAR Prescriptions: SEE MAR Over the Counter: SEE MAR History of alcohol / drug use?: Yes Name of Substance 1: THC (patient denies use; UDS + for THC) 1 - Age of First Use: unk 1 - Amount (size/oz): unk 1 - Frequency: unk 1 - Duration: unk 1 - Last Use / Amount: unk  Allergies:   Allergies  Allergen Reactions  . Shellfish Allergy Anaphylaxis   Lab Results:  Results for orders placed or performed during the hospital encounter of 01/19/16 (from the past 48 hour(s))  Comprehensive metabolic panel     Status: Abnormal   Collection Time: 01/19/16  1:44 PM  Result Value Ref Range   Sodium 139 135 - 145 mmol/L   Potassium 3.8 3.5 - 5.1 mmol/L   Chloride 106 101 - 111 mmol/L   CO2 21 (L) 22 - 32 mmol/L   Glucose, Bld 96 65 - 99 mg/dL   BUN 18 6 - 20 mg/dL   Creatinine, Ser 0.57 0.44 - 1.00 mg/dL   Calcium 9.8 8.9 - 10.3 mg/dL   Total Protein 8.5 (H) 6.5 - 8.1 g/dL   Albumin 4.8 3.5 - 5.0 g/dL   AST 15 15 - 41 U/L   ALT 12 (L) 14 - 54 U/L   Alkaline Phosphatase 91 38 - 126 U/L   Total Bilirubin 0.7 0.3 - 1.2 mg/dL   GFR calc non Af Amer >60 >60 mL/min   GFR calc Af Amer >60 >60 mL/min    Comment: (NOTE) The eGFR has been calculated using the CKD EPI equation. This calculation has not been  validated in all clinical situations. eGFR's persistently <60 mL/min signify possible Chronic Kidney Disease.    Anion gap 12 5 - 15  Ethanol     Status: None   Collection Time: 01/19/16  1:44 PM  Result Value Ref Range   Alcohol, Ethyl (B) <5 <5 mg/dL    Comment:        LOWEST DETECTABLE LIMIT FOR SERUM ALCOHOL IS 5 mg/dL FOR MEDICAL PURPOSES ONLY   CBC     Status: Abnormal   Collection Time: 01/19/16  1:44 PM  Result Value Ref Range   WBC 10.7 (H) 4.0 - 10.5 K/uL   RBC 4.22 3.87 - 5.11 MIL/uL   Hemoglobin 12.5 12.0 - 15.0 g/dL   HCT 37.6 36.0 - 46.0 %   MCV 89.1 78.0 - 100.0 fL   MCH 29.6 26.0 - 34.0 pg   MCHC  33.2 30.0 - 36.0 g/dL   RDW 14.1 11.5 - 15.5 %   Platelets 435 (H) 150 - 400 K/uL    Blood Alcohol level:  Lab Results  Component Value Date   ETH <5 11/11/3610    Metabolic Disorder Labs:  No results found for: HGBA1C, MPG No results found for: PROLACTIN No results found for: CHOL, TRIG, HDL, CHOLHDL, VLDL, LDLCALC  Current Medications: Current Facility-Administered Medications  Medication Dose Route Frequency Provider Last Rate Last Dose  . FLUoxetine (PROZAC) capsule 10 mg  10 mg Oral Daily Delfin Gant, NP   10 mg at 01/21/16 2449  . hydrOXYzine (ATARAX/VISTARIL) tablet 25 mg  25 mg Oral TID PRN Delfin Gant, NP      . traZODone (DESYREL) tablet 50 mg  50 mg Oral QHS Delfin Gant, NP   50 mg at 01/20/16 2058   PTA Medications: No prescriptions prior to admission    Musculoskeletal: Strength & Muscle Tone: within normal limits Gait & Station: normal Patient leans: N/A  Psychiatric Specialty Exam: Physical Exam  Review of Systems  Constitutional: Negative.   HENT: Negative.   Eyes: Negative.   Respiratory: Negative.   Cardiovascular: Negative.   Gastrointestinal: Negative.   Genitourinary: Negative.   Musculoskeletal: Negative.   Skin: Negative.   Neurological: Negative for seizures.  Endo/Heme/Allergies: Negative.    Psychiatric/Behavioral: Positive for depression and suicidal ideas.  All other systems reviewed and are negative.   Blood pressure 100/54, pulse 96, temperature 98.8 F (37.1 C), temperature source Oral, resp. rate 16, height 5' 9"  (1.753 m), weight 182 lb (82.555 kg), last menstrual period 12/28/2015, SpO2 100 %, not currently breastfeeding.Body mass index is 26.86 kg/(m^2).  General Appearance: Fairly Groomed  Engineer, water::  Good  Speech:  Normal Rate  Volume:  Normal  Mood:  denies depression at this time, states " my mood is pretty good today"  Affect:  Appropriate and reactive   Thought Process:  Linear  Orientation:  Full (Time, Place, and Person)  Thought Content:  denies hallucinations, no delusions   Suicidal Thoughts:  No denies any suicidal ideations or self injurious ideations, contracts for safety on the unit   Homicidal Thoughts:  No denies any homicidal ideations   Memory:  recent and remote grossly intact   Judgement:  Other:  improving   Insight:  improving   Psychomotor Activity:  Normal  Concentration:  Good  Recall:  Good  Fund of Knowledge:Good  Language: Good  Akathisia:  Negative  Handed:  Right  AIMS (if indicated):     Assets:  Communication Skills Desire for Improvement Resilience  ADL's:  Intact  Cognition: WNL  Sleep:  Number of Hours: 6.25     Treatment Plan Summary: Daily contact with patient to assess and evaluate symptoms and progress in treatment, Medication management, Plan inpatient treatment  and medications as below   Observation Level/Precautions:  15 minute checks  Laboratory:  as needed   Psychotherapy:  Milieu, support   Medications:  Prozac 10 mgrs QDAY for depression, Vistaril PRNs for anxiety as needed, Trazodone 50 mgrs QHS PRN for insomnia as needed   Consultations:  As needed   Discharge Concerns: -     Estimated LOS: 3-4 days   Other:     I certify that inpatient services furnished can reasonably be expected to improve  the patient's condition.    Neita Garnet, MD 5/4/20171:40 PM

## 2016-01-21 NOTE — Clinical Social Work Note (Signed)
Phone call from patient's transitional living specialist w [address], [place] [phone]).  Patient has recently been discharged from transitional living program, were in the process [facility] and parenting education.  [facility] does not have therapy available, [facility] however was unable to complete the assessment on scheduled day.  Eduard ClosBethea suggests CSW contact Transitional Living program supervisor, Tera Partridgeawanna Price [phone]) to discuss other community resources/referrals available to patient at discharge.  Santa GeneraAnne Vaniyah Lansky, LCSW Lead Clinical Social Worker Phone:  [phone]

## 2016-01-21 NOTE — BHH Counselor (Signed)
Adult Comprehensive Assessment  Patient ID: Pamela Jefferson, female   DOB: 10/31/1995, 20 y.o.   MRN: [id number]  Information Source: Information source: Patient  Current Stressors:  Educational / Learning stressors: dropped uot in 12th grade due to pregnancy Employment / Job issues: 35 - 40 hours/week at [employer], has difficulty getting hours that coordinate w her child care resources Family Relationships: strong support from mother and grandparents Surveyor, quantityinancial / Lack of resources (include bankruptcy): struggles to pay bills, often has to pay all bills as boyfriend does not have the money Housing / Lack of housing: stable, difficulty payig rent Physical health (include injuries & life threatening diseases): gave birth one year ago at age 20 Social relationships: socially isolated, "I have a hard time letting people in' Substance abuse: denies, positive UDS for THC per record Bereavement / Loss: no issues  Living/Environment/Situation:  Living Arrangements: Spouse/significant other, Children Living conditions (as described by patient or guardian): [place] and 19 year old son How long has patient lived in current situation?: 4 months living together What is atmosphere in current home: Other (Comment) (arguments over finances)  Family History:  Marital status: Long term relationship Long term relationship, how long?: 8 months What types of issues is patient dealing with in the relationship?: boyfriend has different job w less income than previous job, wants patient to pay bills and is not able/willing to contribute financially as he did before Are you sexually active?: Yes What is your sexual orientation?: heterosexual Has your sexual activity been affected by drugs, alcohol, medication, or emotional stress?: no Does patient have children?: Yes How many children?: 1 How is patient's relationship with their children?: good, loves to be mother, "exciting, new  things happen every day"; stressful due to son now walking, saying "no"  Childhood History:  By whom was/is the patient raised?: Grandparents, Malen GauzeFoster parents, Mother Additional childhood history information: born when mother was [age], moved w mother into grandparents house who cared for her til mother was on her feet financially.  lived w mother from age 89 - [age], removed by DSS because mother could not support patient and her 4 siblings on a minimum wage job, placed w stable foster family.  [facility] she aged out of the foster care system at [age].   Description of patient's relationship with caregiver when they were a child: supportive, good Patient's description of current relationship with people who raised him/her: has contact w grandparents, mother and foster family - says foster family is a good support/role model How were you disciplined when you got in trouble as a child/adolescent?: unknown Does patient have siblings?: Yes Number of Siblings: 4 Description of patient's current relationship with siblings: sisters 4017, 2714, twin brothers 6712; all are in DSS custody at this time, mother has worked her program and will regain custody in May 2017 Did patient suffer any verbal/emotional/physical/sexual abuse as a child?: No Did patient suffer from severe childhood neglect?: Yes Patient description of severe childhood neglect: removed from mother's care when she was 16 due to dependency/neglect Has patient ever been sexually abused/assaulted/raped as an adolescent or adult?: No Was the patient ever a victim of a crime or a disaster?: No Witnessed domestic violence?: No Has patient been effected by domestic violence as an adult?: No  Education:  Highest grade of school patient has completed: 11th, dropped out in 12 grade, wants to finish high school degree, become a CNA and then an RCharity fundraiser  Currently a student?: No Learning disability?: No  Employment/Work Situation:   Employment  situation: Employed 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, Nature conservation officer, order puller How long has patient been employed?: 6 months Patient's job has been impacted by current illness: Yes Describe how patient's job has been impacted: job is source of stress as she needs to have full time hours but schedule conflicts w child care availability jeopardizes her job and loses her income What is the longest time patient has a held a job?: current job Where was the patient employed at that time?: see above Has patient ever been in the [employer]?: No Has patient ever served in combat?: No Did You Receive Any Psychiatric Treatment/Services While in Equities trader?: No Are There Guns or Other Weapons in Your Home?: No  Financial Resources:   Financial resources: Income from employment, Medicaid Does patient have a representative payee or guardian?: No  Alcohol/Substance Abuse:   What has been your use of drugs/alcohol within the last 12 months?: denies use Alcohol/Substance Abuse Treatment Hx: Denies past history Has alcohol/substance abuse ever caused legal problems?: No  Social Support System:   Conservation officer, nature Support System: Fair Museum/gallery exhibitions officer System: has support and frequent contact w family and foster family, states she has "no real friends, I stay to myself" Type of faith/religion: Ephriam Knuckles How does patient's faith help to cope with current illness?: attends church some at [facility] [facility], aware they have a GED program and may reach out to them for more support  Leisure/Recreation:   Leisure and Hobbies: "going places w my son", "to the mall to buy him shoes, clothes, ice cream', "going to the park w him"  Strengths/Needs:   What things does the patient do well?: technology, mothering, good listener, honest, loyal In what areas does patient struggle / problems for patient: finances, stress, 'trying to learn how to trust people, how to make friends, stop putting walls  up"  Discharge Plan:   Does patient have access to transportation?: Yes Will patient be returning to same living situation after discharge?: Yes Currently receiving community mental health services: Yes (From [facility], in home therapy w Carollee Sires; in [place] program w Grayland Ormond at DSS) Does patient have financial barriers related to discharge medications?: No  Summary/Recommendations:   Summary and Recommendations (to be completed by the evaluator): Patient is an 20 year old female, admitted involuntarily for treatment of Major Depressive Disorder, expressing suicidal ideation w intent to kill herself by hanging.  Mother of one year old son, lives w boyfriend.  Works fulltime at [employer], wants to get high school degree an eventually become an Charity fundraiser.  Was in DSS custody at age 44, receiving [facility].  Has contact w grandparents and mother as well as foster family who cared for her when she was a teenager.  Stressors prior to admission were "too much going on, financial stress, conficts w boyfriend re finances, parenting stress."  Patient current w in home therapist, does not have medications prescribed at present.    Sallee Lange 01/21/2016

## 2016-01-21 NOTE — Progress Notes (Signed)
Patient ID: Pamela Jefferson, female   DOB: 12/04/1995, 20 y.o.   MRN: [id number] D: Client visible on the unit, smiling, interact appropriately. Client reports admission helpful "learn coping skill, make strategies, not try to do all at one time" Client concerned about discharge as she has to be in traffic court tomorrow for a ticket. Client reports she has good support system in BF and family. A: Writer provided emotional support, encouraged client to utilized support system to help decreased stress level. Medication reviewed, administered as ordered. Staff will monitor q6515min for safety. R: client is safe on the unit, attended unit.

## 2016-01-21 NOTE — Progress Notes (Signed)
[facility] Group Notes:  (Nursing/MHT/Case Management/Adjunct)  Date:  01/21/2016  Time:  0900  Type of Therapy:  Nurse Education  Participation Level: Resistant   Participation Quality:  Appropriate and Resistant  Affect:  Flat  Cognitive:  Alert and Oriented  Insight:  Lacking  Engagement in Group:  Poor  Modes of Intervention:  Discussion, Socialization and Support  Summary of Progress/Problems:Pt was resistant at first in group saying that her goal was leaving. Pt started talking more and reported that her stressors are work, caring for a child and having a car. I "flipped out." Pt asked for a journal at the end of group and coping skills for her stress was discussed.  Beatrix ShipperWright, Khaza Blansett Martin 01/21/2016, 3:38 PM

## 2016-01-21 NOTE — Progress Notes (Signed)
Adult Psychoeducational Group Note  Date:  01/21/2016 Time:  9:37 PM  Group Topic/Focus:  Wrap-Up Group:   The focus of this group is to help patients review their daily goal of treatment and discuss progress on daily workbooks.  Participation Level:  Active  Participation Quality:  Appropriate  Affect:  Appropriate  Cognitive:  Alert  Insight: Appropriate  Engagement in Group:  Engaged  Modes of Intervention:  Discussion  Additional Comments:  Patient states, "my day was good". On a scale between 1-10, (1=worse, 10=best) patient rated her day a 9. Patient goal for today was to discharge.   Lenaya Pietsch L Ciria Bernardini 01/21/2016, 9:37 PM

## 2016-01-21 NOTE — Tx Team (Addendum)
Interdisciplinary Treatment Plan Update (Adult) Date: 01/21/2016    Time Reviewed: 9:30 AM  Progress in Treatment: Attending groups: Continuing to assess, patient new to milieu Participating in groups: Continuing to assess, patient new to milieu Taking medication as prescribed: Yes Tolerating medication: Yes Family/Significant other contact made: No, CSW assessing for appropriate contacts Patient understands diagnosis: Yes Discussing patient identified problems/goals with staff: Yes Medical problems stabilized or resolved: Yes Denies suicidal/homicidal ideation: Yes Issues/concerns per patient self-inventory: Yes Other:  New problem(s) identified: N/A  Discharge Plan or Barriers: CSW continuing to assess, patient new to milieu.  Reason for Continuation of Hospitalization:  Depression Anxiety Medication Stabilization   Comments: N/A  Estimated length of stay: 3-5 days    Patient is a 20 year old female who presented to the hospital with SI with plan to hang herself. Primary trigger(s) for admission was identified as financial, employment, and family stressors. Patient will benefit from crisis stabilization, medication evaluation, group therapy and psycho education in addition to case management for discharge planning. At discharge, it is recommended that Pt remain compliant with established discharge plan and continued treatment.   Review of initial/current patient goals per problem list:  1. Goal(s): Patient will participate in aftercare plan   Met: Yes   Target date: 3-5 days post admission date   As evidenced by: Patient will participate within aftercare plan AEB aftercare provider and housing plan at discharge being identified.  5/4: Goal not met: CSW assessing for appropriate referrals for pt and will have follow up secured prior to d/c.  5/5: Goal met. Home with outpatient.    2. Goal (s): Patient will exhibit decreased depressive symptoms and suicidal  ideations.   Met: Yes   Target date: 3-5 days post admission date   As evidenced by: Patient will utilize self rating of depression at 3 or below and demonstrate decreased signs of depression or be deemed stable for discharge by MD.  5/4: Goal not met: Pt presents with flat affect and depressed mood.  Pt admitted with depression rating of 10.  Pt to show decreased sign of depression and a rating of 3 or less before d/c.    5/5: Goal met. Patient reports minimal depression today, denies SI. Reports feeling safe for discharge.      3. Goal(s): Patient will demonstrate decreased signs and symptoms of anxiety.   Met: Yes   Target date: 3-5 days post admission date   As evidenced by: Patient will utilize self rating of anxiety at 3 or below and demonstrated decreased signs of anxiety, or be deemed stable for discharge by MD  5/4: Goal not met: Pt presents with anxious mood and affect.  Pt admitted with anxiety rating of 10.  Pt to show decreased sign of anxiety and a rating of 3 or less before d/c.  5/5: Goal met. Rates anxiety at 1-2.   Attendees: Patient:    Family:    Physician: Dr. Parke Poisson 01/21/2016 9:30 AM  Nursing: Darrol Angel; Desma Paganini, RN 01/21/2016 9:30 AM  Clinical Social Worker: Tilden Fossa, LCSW 01/21/2016 9:30 AM  Other: Maxie Better, LCSW  01/21/2016 9:30 AM  Other:  01/21/2016 9:30 AM  Other: Lars Pinks, Case Manager 01/21/2016 9:30 AM  Other: Agustina Caroli; Samuel Jester, NP 01/21/2016 9:30 AM  Other:      Scribe for Treatment Team:  Tilden Fossa, [place]

## 2016-01-22 MED ORDER — FLUOXETINE HCL 20 MG PO CAPS
20.0000 mg | ORAL_CAPSULE | Freq: Every day | ORAL | Status: DC
  Administered 2016-01-23: 20 mg via ORAL
  Filled 2016-01-22 (×3): qty 1

## 2016-01-22 NOTE — Plan of Care (Signed)
Problem: Diagnosis: Increased Risk For Suicide Attempt Goal: STG-Patient Will Report Suicidal Feelings to Staff Outcome: Progressing Client denies SHI. Client is safe on the unit AEB q6915min safety checks, reporting support system upon discharge "I have my BF, my momma, my grand momma"

## 2016-01-22 NOTE — [facility] Suicide Risk Assessment (Signed)
[facility] INPATIENT:  Family/Significant Other Suicide Prevention Education  Suicide Prevention Education:  Education Completed; Pamela Jefferson, Pt's grandmother [phone], has been identified by the patient as the family member/significant other with whom the patient will be residing, and identified as the person(s) who will aid the patient in the event of a mental health crisis (suicidal ideations/suicide attempt).  With written consent from the patient, the family member/significant other has been provided the following suicide prevention education, prior to the and/or following the discharge of the patient.  The suicide prevention education provided includes the following:  Suicide risk factors  Suicide prevention and interventions  National Suicide Hotline telephone number  [facility] assessment telephone number  [facility] Emergency Assistance 911  [facility] and/or Residential Mobile Crisis Unit telephone number  Request made of family/significant other to:  Remove weapons (e.g., guns, rifles, knives), all items previously/currently identified as safety concern.    Remove drugs/medications (over-the-counter, prescriptions, illicit drugs), all items previously/currently identified as a safety concern.  The family member/significant other verbalizes understanding of the suicide prevention education information provided.  The family member/significant other agrees to remove the items of safety concern listed above.  Elaina Hoopsarter, Chapin Arduini M 01/22/2016, 4:09 PM

## 2016-01-22 NOTE — Plan of Care (Signed)
Problem: Ineffective individual coping Goal: STG: Patient will remain free from self harm Outcome: Progressing Pt remains free from self harm today, 01/22/2016

## 2016-01-22 NOTE — Progress Notes (Signed)
Recreation Therapy Notes  Date: 05.05.2017 Time: 9:30am Location: 30 Hall Group Room   Group Topic: Stress Management  Goal Area(s) Addresses:  Patient will actively participate in stress management techniques presented during session.   Behavioral Response: Did not attend   Kamille Toomey L Samyah Bilbo, LRT/CTRS        Cordae Mccarey L 01/22/2016 10:00 AM 

## 2016-01-22 NOTE — Progress Notes (Signed)
[facility] MD Progress Note  01/22/2016 2:54 PM Pamela Jefferson  MRN:  [id number] Subjective:  States she is feeling better, mood improved. Denies any suicidal ideations . At this time denies medication side effects. Objective : I have discussed case with treatment team and have met with patient. At this time patient reports improved mood and presents with fuller range of affect. Denies suicidal ideations. Thus far tolerating antidepressant medication well ( Prozac) and denies any subjective sense of activation or any self injurious ideations. She states she is sleeping better, as well . No disruptive behaviors on unit, calm, pleasant on approach. Some group participation. As she improves she is focusing on being discharged soon, and states she really misses being with her young child and BF .  Principal Problem: Suicidal ideations Diagnosis:   Patient Active Problem List   Diagnosis Date Noted  . Depression [F32.9]   . Suicidal ideation [R45.851]   . Suicidal ideations [R45.851]   . Severe major depression without psychotic features ([place]) [F32.2] 01/20/2016  . MDD (major depressive disorder), single episode, severe ([place]) [F32.2] 01/20/2016   Total Time spent with patient: 20 minutes    Past Medical History:  Past Medical History  Diagnosis Date  . Medical history non-contributory   . Gonorrhea   . Chlamydia     Past Surgical History  Procedure Laterality Date  . Fracture surgery      left arm   Family History: History reviewed. No pertinent family history.  Social History:  History  Alcohol Use No     History  Drug Use No    Social History   Social History  . Marital Status: Single    Spouse Name: N/A  . Number of Children: N/A  . Years of Education: N/A   Social History Main Topics  . Smoking status: Never Smoker   . Smokeless tobacco: Never Used  . Alcohol Use: No  . Drug Use: No  . Sexual Activity: Yes    Birth Control/ Protection: None, IUD   Other Topics  Concern  . None   Social History Narrative   Additional Social History:    Pain Medications: SEE MAR Prescriptions: SEE MAR Over the Counter: SEE MAR History of alcohol / drug use?: Yes Name of Substance 1: THC (patient denies use; UDS + for THC) 1 - Age of First Use: unk 1 - Amount (size/oz): unk 1 - Frequency: unk 1 - Duration: unk 1 - Last Use / Amount: unk  Sleep: Good  Appetite:  Good  Current Medications: Current Facility-Administered Medications  Medication Dose Route Frequency Provider Last Rate Last Dose  . [START ON 01/23/2016] FLUoxetine (PROZAC) capsule 20 mg  20 mg Oral Daily Myer Peer Cobos, MD      . hydrOXYzine (ATARAX/VISTARIL) tablet 25 mg  25 mg Oral TID PRN Delfin Gant, NP      . traZODone (DESYREL) tablet 50 mg  50 mg Oral QHS Delfin Gant, NP   50 mg at 01/21/16 2216    Lab Results: No results found for this or any previous visit (from the past 48 hour(s)).  Blood Alcohol level:  Lab Results  Component Value Date   ETH <5 01/19/2016    Physical Findings: AIMS: Facial and Oral Movements Muscles of Facial Expression: None, normal Lips and Perioral Area: None, normal Jaw: None, normal Tongue: None, normal,Extremity Movements Upper (arms, wrists, hands, fingers): None, normal Lower (legs, knees, ankles, toes): None, normal, Trunk Movements Neck, shoulders, hips: None,  normal, Overall Severity Severity of abnormal movements (highest score from questions above): None, normal Incapacitation due to abnormal movements: None, normal Patient's awareness of abnormal movements (rate only patient's report): No Awareness, Dental Status Current problems with teeth and/or dentures?: No Does patient usually wear dentures?: No  CIWA:    COWS:     Musculoskeletal: Strength & Muscle Tone: within normal limits Gait & Station: normal Patient leans: N/A  Psychiatric Specialty Exam: ROS denies headache, denies nausea, denies vomiting, no rash    Blood pressure 110/66, pulse 109, temperature 98.3 F (36.8 C), temperature source Oral, resp. rate 16, height 5' 9"  (1.753 m), weight 182 lb (82.555 kg), last menstrual period 12/28/2015, SpO2 100 %, not currently breastfeeding.Body mass index is 26.86 kg/(m^2).  General Appearance: Fairly Groomed  Engineer, water::  Good  Speech:  Normal Rate  Volume:  Normal  Mood:  improving,less depressed   Affect:  Appropriate and more reactive   Thought Process:  Linear  Orientation:  Full (Time, Place, and Person)  Thought Content:  denies hallucinations, no delusions   Suicidal Thoughts:  No- denies any suicidal or self injurious ideations, denies any homicidal ideations, contracts for safety on unit   Homicidal Thoughts:  No  Memory:  recent and remote grossly intact   Judgement:  Other:  improving   Insight:  improving   Psychomotor Activity:  Normal  Concentration:  Good  Recall:  Good  Fund of Knowledge:Good  Language: Good  Akathisia:  Negative  Handed:  Right  AIMS (if indicated):     Assets:  Communication Skills Desire for Improvement Resilience  ADL's:  Intact  Cognition: WNL  Sleep:  Number of Hours: 6.75  Assessment -  At this time patient reports improvement , with improving  mood and range of affect . Denies suicidal ideations . Focused on being discharged soon in order to return to loved ones . Tolerating medications well thus far ( On Prozac trial, and on Trazodone for sleep as needed )  Treatment Plan Summary: Daily contact with patient to assess and evaluate symptoms and progress in treatment, Medication management, Plan inpatient treatment  and medications as below  Encourage ongoing group /milieu participation to work on Radiographer, therapeutic and symptom reduction  Treatment team working on disposition planning  Continue Prozac 20 mgrs QDAY for depression, anxiety Continue Vistaril 25 mgrs Q 6 hours PRN for anxiety as needed  Continue Trazodone 50 mgrs QHS PRN for insomnia as  needed  Neita Garnet, MD 01/22/2016, 2:54 PM

## 2016-01-22 NOTE — Progress Notes (Signed)
  [facility] :  Will you be returning to the same living situation after discharge:  Yes,  patient plans to return home At discharge, do you have transportation home?: Yes,  friends/family Do you have the ability to pay for your medications: Yes,  patient will be provided with prescriptions at discharge  Release of information consent forms completed and in the chart;  Patient's signature needed at discharge.  Patient to Follow up at: Follow-up Information    [facility]   Specialty:  Professional Counselor   Why:  Walk-in clinic Monday-Friday between 8am to 12pm for assessment for therapy and medication management services. Please arrive early and bring your ID and insurance card in order to be seen as quickly as possible.    Contact information:   [facility] [address] [place] [address] [phone]       Next level of care provider has access to [facility] Link:no  Safety Planning and Suicide Prevention discussed: Yes,  with patient  Have you used any form of tobacco in the last 30 days? (Cigarettes, Smokeless Tobacco, Cigars, and/or Pipes): No  Has patient been referred to the Quitline?: N/A patient is not a smoker  Patient has been referred for addiction treatment: Yes  Nickholas Goldston, West CarboKristin L 01/22/2016, 3:25 PM

## 2016-01-22 NOTE — [facility] Group Notes (Signed)
[facility] LCSW Group Therapy 01/22/2016  1:15 PM   Type of Therapy: Group Therapy  Participation Level: Did Not Attend. Patient invited to participate but declined.   Andilyn Bettcher, MSW, LCSW Clinical Social Worker [facility] [phone]   

## 2016-01-22 NOTE — [facility] Group Notes (Signed)
Adult Psychoeducational Group Note  Date:  01/22/2016 Time:  8:40 PM  Group Topic/Focus:  Wrap-Up Group:   The focus of this group is to help patients review their daily goal of treatment and discuss progress on daily workbooks.  Participation Level:  Active  Participation Quality:  Appropriate  Affect:  Appropriate  Cognitive:  Appropriate  Insight: Appropriate  Engagement in Group:  Engaged  Modes of Intervention:  Discussion  Additional Comments:  Pt rated her day a 10 because her peers made her laugh.  Her goal was to stay positive.  Pt is discharging tomorrow to see her son.  Caroll RancherLindsay, Maysa Lynn A 01/22/2016, 8:40 PM

## 2016-01-22 NOTE — [facility] Group Notes (Signed)
   [facility]  01/22/2016  8:45 AM   Participation Quality: Alert, Appropriate and Oriented  Mood/Affect: Blunted  Depression Rating: Reports little to no decreased  Anxiety Rating: 1-2  Thoughts of Suicide: Pt denies SI/HI  Will you contract for safety? Yes  Current AVH: Pt denies  Plan for Discharge/Comments: Pt attended discharge planning group and actively participated in group. CSW provided pt with today's workbook. Patient plans to return home to follow up with outpatient services.  Transportation Means: Pt reports access to transportation  Supports: No supports mentioned at this time  Samuella BruinKristin Adilenne Ashworth, MSW, Johnson & JohnsonLCSW Clinical Social Worker [facility] [phone]

## 2016-01-22 NOTE — Progress Notes (Signed)
Patient ID: Pamela Jefferson, female   DOB: 07/18/1996, 20 y.o.   MRN: [id number] D: Client is seen in dayroom interacting and on the phone. Client is smiling, pleasant, reports feelings "really happy" "I don't feel sad with the Prozac" "I going to use my coping skills" "ready go home, see my baby" "I'm not suicidal" "I don't feel depressed. A: Writer encouraged client to use dependable, trustworthy resources to help with baby as it will alleviate some stress. Client also encouraged to use copying skills and continue medications, reporting any side effects. Staff will monitor q615min for safety.  R: client is safe on the unit, attended group.

## 2016-01-22 NOTE — Progress Notes (Signed)
Patient ID: Pamela Jefferson, female   DOB: 11/13/1995, 20 y.o.   MRN: [id number]   Pt currently presents with a masked affect and ambivalent behavior. Per self inventory, pt rates depression, hopelessness and anxiety at a 0. Pt's daily goal is to "get to go home to my son" and they intend to do so by "I will give myself a pat on the back." Pt reports good sleep, a good appetite, normal energy and good concentration. Pt forwards little to Clinical research associatewriter today.   Pt provided with medications per providers orders. Pt's labs and vitals were monitored throughout the day. Pt supported emotionally and encouraged to express concerns and questions. Pt educated on medications.  Pt's safety ensured with 15 minute and environmental checks. Pt currently denies SI/HI and A/V hallucinations. Pt verbally agrees to seek staff if SI/HI or A/VH occurs and to consult with staff before acting on these thoughts. Pt to Will continue POC.

## 2016-01-23 ENCOUNTER — Encounter (HOSPITAL_COMMUNITY): Payer: Self-pay | Admitting: Registered Nurse

## 2016-01-23 DIAGNOSIS — F322 Major depressive disorder, single episode, severe without psychotic features: Principal | ICD-10-CM

## 2016-01-23 MED ORDER — TRAZODONE HCL 50 MG PO TABS: 50.0000 mg | ORAL_TABLET | Freq: Every day | ORAL | Status: DC

## 2016-01-23 MED ORDER — HYDROXYZINE HCL 25 MG PO TABS: 25.0000 mg | ORAL_TABLET | Freq: Three times a day (TID) | ORAL | Status: DC | PRN

## 2016-01-23 MED ORDER — FLUOXETINE HCL 20 MG PO CAPS: 20.0000 mg | ORAL_CAPSULE | Freq: Every day | ORAL | Status: DC

## 2016-01-23 NOTE — [facility] Suicide Risk Assessment (Signed)
[facility]   Principal Problem: Suicidal ideations Discharge Diagnoses:  Patient Active Problem List   Diagnosis Date Noted  . Depression [F32.9]   . Suicidal ideation [R45.851]   . Suicidal ideations [R45.851]   . Severe major depression without psychotic features ([facility]) [F32.2] 01/20/2016  . MDD (major depressive disorder), single episode, severe ([facility]) [F32.2] 01/20/2016    Total Time spent with patient: 20 minutes  Musculoskeletal: Strength & Muscle Tone: within normal limits Gait & Station: normal Patient leans: normal  Psychiatric Specialty Exam: Review of Systems  Constitutional: Negative.   HENT: Negative.   Eyes: Negative.   Respiratory: Negative.   Cardiovascular: Negative.   Gastrointestinal: Negative.   Genitourinary: Negative.   Musculoskeletal: Negative.   Skin: Negative.   Neurological: Positive for dizziness.  Endo/Heme/Allergies: Negative.   Psychiatric/Behavioral: The patient has insomnia.     Blood pressure 112/55, pulse 82, temperature 98.4 F (36.9 C), temperature source Oral, resp. rate 20, height 5\' 9"  (1.753 m), weight 82.555 kg (182 lb), last menstrual period 12/28/2015, SpO2 100 %, not currently breastfeeding.Body mass index is 26.86 kg/(m^2).  General Appearance: Fairly Groomed  Patent attorneyye Contact::  Fair  Speech:  Clear and Coherent409  Volume:  Normal  Mood:  Euthymic  Affect:  Appropriate  Thought Process:  Coherent and Goal Directed  Orientation:  Full (Time, Place, and Person)  Thought Content:  plans as she moves on  Suicidal Thoughts:  No  Homicidal Thoughts:  No  Memory:  Immediate;   Fair Recent;   Fair Remote;   Fair  Judgement:  Fair  Insight:  Present  Psychomotor Activity:  Normal  Concentration:  Fair  Recall:  FiservFair  Fund of Knowledge:Fair  Language: Fair  Akathisia:  No  Handed:  Right  AIMS (if indicated):     Assets:  Desire for Improvement Housing Social  Support Transportation Vocational/Educational  Sleep:  Number of Hours: 6.25  Cognition: WNL  ADL's:  Intact  In full contact with reality. There are no active SI plans or intent. States she has learned ways of dealing with stress,. Willing to pursue outpatient treatment Mental Status Per Nursing Assessment::   On Admission:  NA  Demographic Factors:  Adolescent or young adult  Loss Factors: Financial problems/change in socioeconomic status  Historical Factors: none identified  Risk Reduction Factors:   Responsible for children under 19 years of age, Religious beliefs about death, Employed and Positive social support  Continued Clinical Symptoms:  Depression:   Insomnia  Cognitive Features That Contribute To Risk:  None    Suicide Risk:  Minimal: No identifiable suicidal ideation.  Patients presenting with no risk factors but with morbid ruminations; may be classified as minimal risk based on the severity of the depressive symptoms  Follow-up Information    [facility]   Specialty:  Professional Counselor   Why:  Walk-in clinic Monday-Friday between 8am to 12pm for assessment for therapy and medication management services. Please arrive early and bring your ID and insurance card in order to be seen as quickly as possible.    Contact information:   [facility] [address] [place] [address] [phone]       Plan Of Care/Follow-up recommendations:  Activity:  as tolerated Diet:  regular Follow up as above Micheala Morissette A, MD 01/23/2016, 10:29 AM

## 2016-01-23 NOTE — [facility] Group Notes (Signed)
:  [facility] LCSW Group Therapy  01/23/2016  / 10 AM  Type of Therapy:  Group Therapy  Participation Level:  Minimal  Participation Quality:  Attentive  Affect:  Flat  Cognitive:  Appropriate  Insight:  Developing/Improving  Engagement in Therapy:  Developing/Improving  Modes of Intervention:  Discussion, Exploration, Rapport Building, Socialization and Support  Summary of Progress/Problems: The main focus of today's process group was for the patient to identify ways in which they have in the past sabotaged their own recovery. Motivational Interviewing was utilized to ask the group members what they get out of their self sabotaging behavior(s), and what reasons they may have for wanting to change. Patient identified with isolation for extreme periods of time (up to two months) and suicidal gestures as self sabotaging behaviors which she intends to refrain from   Carney Bernatherine C Aletha Allebach, LCSW

## 2016-01-23 NOTE — Discharge Summary (Signed)
Physician Discharge Summary Note  Patient:  Pamela Jefferson is an 20 y.o., female MRN:  [id number] DOB:  08/06/1996 Patient phone:  [phone] (home)  Patient address:   [address] [place] [address],  Total Time spent with patient: 30 minutes  Date of Admission:  01/20/2016 Date of Discharge: 01/23/16  Reason for Admission:  Worsening depression and feeling overwhelmed.  Patient started to have suicidal thoughts with plan to hang herself.    Principal Problem: Suicidal ideations Discharge Diagnoses: Patient Active Problem List   Diagnosis Date Noted  . Depression [F32.9]   . Suicidal ideation [R45.851]   . Suicidal ideations [R45.851]   . Severe major depression without psychotic features (HCC) [F32.2] 01/20/2016  . MDD (major depressive disorder), single episode, severe (HCC) [F32.2] 01/20/2016    Past Psychiatric History: This was patients first psychiatric admission.  She denied a history of suicide attempts in the past; self cutting; self injurious ideations; psychosis; mania/hypomania; and PTSD. Also denied history of eating disorder and psychiatric medications in the past   Past Medical History:  Past Medical History  Diagnosis Date  . Medical history non-contributory   . Gonorrhea   . Chlamydia     Past Surgical History  Procedure Laterality Date  . Fracture surgery      left arm   Family History: History reviewed. No pertinent family history. Family Psychiatric  History: Denies history of family mental illness and substance abuse Social History:  History  Alcohol Use No     History  Drug Use No    Social History   Social History  . Marital Status: Single    Spouse Name: N/A  . Number of Children: N/A  . Years of Education: N/A   Social History Main Topics  . Smoking status: Never Smoker   . Smokeless tobacco: Never Used  . Alcohol Use: No  . Drug Use: No  . Sexual Activity: Yes    Birth Control/ Protection: None, IUD   Other Topics  Concern  . None   Social History Narrative    Hospital Course:  Pamela Jefferson was admitted for Suicidal ideations and crisis management.  She was treated with the following medications Prozac 20 mg daily for Depression; Vistaril 25 mg three times day as needed for Anxiety; and Trazodone 150 mg at bed time as needed for Insomnia.  Pamela Jefferson was discharged with current medication and was instructed on how to take medications as prescribed; (details listed below under Medication List).  Medical problems were identified and treated as needed.  Home medications were restarted as appropriate.  Improvement was monitored by observation and Pamela Jefferson daily report of symptom reduction.  Emotional and mental status was monitored by daily self-inventory reports completed by Pamela Jefferson and clinical staff.         Pamela Jefferson was evaluated by the treatment team for stability and plans for continued recovery upon discharge.  Pamela Jefferson motivation was an integral factor for scheduling further treatment.  Employment, transportation, bed availability, health status, family support, and any pending legal issues were also considered during her hospital stay.  She was offered further treatment options upon discharge including but not limited to Residential, Intensive Outpatient, and Outpatient treatment.  Pamela Jefferson will follow up with the services as listed below under Follow Up Information.     Upon completion of this admission the Pamela Jefferson was both mentally and medically stable for discharge denying suicidal/homicidal ideation, auditory/visual/tactile hallucinations, delusional thoughts and  paranoia.       Physical Findings: AIMS: Facial and Oral Movements Muscles of Facial Expression: None, normal Lips and Perioral Area: None, normal Jaw: None, normal Tongue: None, normal,Extremity Movements Upper (arms, wrists, hands, fingers): None, normal Lower (legs, knees, ankles,  toes): None, normal, Trunk Movements Neck, shoulders, hips: None, normal, Overall Severity Severity of abnormal movements (highest score from questions above): None, normal Incapacitation due to abnormal movements: None, normal Patient's awareness of abnormal movements (rate only patient's report): No Awareness, Dental Status Current problems with teeth and/or dentures?: No Does patient usually wear dentures?: No  CIWA:    COWS:     Musculoskeletal: Strength & Muscle Tone: within normal limits Gait & Station: normal Patient leans: N/A  Psychiatric Specialty Exam:  See Suicide Risk Assessment ROS  Blood pressure 112/55, pulse 82, temperature 98.4 F (36.9 C), temperature source Oral, resp. rate 20, height 5\' 9"  (1.753 m), weight 82.555 kg (182 lb), last menstrual period 12/28/2015, SpO2 100 %, not currently breastfeeding.Body mass index is 26.86 kg/(m^2).  Have you used any form of tobacco in the last 30 days? (Cigarettes, Smokeless Tobacco, Cigars, and/or Pipes): No  Has this patient used any form of tobacco in the last 30 days? (Cigarettes, Smokeless Tobacco, Cigars, and/or Pipes) Yes, No  Blood Alcohol level:  Lab Results  Component Value Date   ETH <5 01/19/2016    Metabolic Disorder Labs:  No results found for: HGBA1C, MPG No results found for: PROLACTIN No results found for: CHOL, TRIG, HDL, CHOLHDL, VLDL, LDLCALC  See Psychiatric Specialty Exam and Suicide Risk Assessment completed by Attending Physician prior to discharge.  Discharge destination:  Home  Is patient on multiple antipsychotic therapies at discharge:  No   Has Patient had three or more failed trials of antipsychotic monotherapy by history:  No  Recommended Plan for Multiple Antipsychotic Therapies: NA      Discharge Instructions    Activity as tolerated - No restrictions    Complete by:  As directed      Diet general    Complete by:  As directed      Discharge instructions    Complete by:  As  directed   Take all of you medications as prescribed by your mental healthcare provider.  Report any adverse effects and reactions from your medications to your outpatient provider promptly.  Do not engage in alcohol and or illegal drug use while on prescription medicines. Keep all scheduled appointments. This is to ensure that you are getting refills on time and to avoid any interruption in your medication.  If you are unable to keep an appointment call to reschedule.  Be sure to follow up with resources and follow ups given. In the event of worsening symptoms call the crisis hotline, 911, and or go to the nearest emergency department for appropriate evaluation and treatment of symptoms. Follow-up with your primary care provider for your medical issues, concerns and or health care needs.            Medication List    TAKE these medications      Indication   FLUoxetine 20 MG capsule  Commonly known as:  PROZAC  Take 1 capsule (20 mg total) by mouth daily.   Indication:  Depression     hydrOXYzine 25 MG tablet  Commonly known as:  ATARAX/VISTARIL  Take 1 tablet (25 mg total) by mouth 3 (three) times daily as needed for anxiety.   Indication:  Anxiety  traZODone 50 MG tablet  Commonly known as:  DESYREL  Take 1 tablet (50 mg total) by mouth at bedtime.   Indication:  Trouble Sleeping       Follow-up Information    Follow up with [facility].   Specialty:  Professional Counselor   Why:  Walk-in clinic Monday-Friday between 8am to 12pm for assessment for therapy and medication management services. Please arrive early and bring your ID and insurance card in order to be seen as quickly as possible.    Contact information:   [facility] [address] [place] [address] [phone]       Follow-up recommendations:  Activity:  As tolerated Diet:  As tolerated  Comments: Malashia Kamaka has been instructed to take  medications as prescribed; and report adverse effects to outpatient provider.  Follow up with primary doctor for any medical issues and If symptoms recur report to nearest emergency or crisis hot line.    SignedAssunta Found, NP 01/23/2016, 10:20 AM  I personally assessed the patient and formulated the plan Madie Reno A. Dub Mikes, M.D.

## 2016-01-23 NOTE — Progress Notes (Signed)
D) Pt being discharged to home accompanied by her boyfriend. Affect and mood are appropriate. Pt denies SI and HI. Rates her depression, hopelessness and anxiety all at a 0. Pt states, "I am glad to be going home so that I can go and spend time with my baby boy".  A) All discharge plans explained to Pt. All belongings returned and all medications explained. Given support, reassurance and praise. Provided with a 1:1. Encouraged Pt to follow up with her outpatient appointment. R) Pt denies SI and HI, delusions and hallucinations.

## 2016-02-22 ENCOUNTER — Emergency Department (HOSPITAL_COMMUNITY)
Admission: EM | Admit: 2016-02-22 | Discharge: 2016-02-22 | Disposition: A | Discharge status: Home / Self Care | Payer: Medicaid Other | Attending: Emergency Medicine | Admitting: Emergency Medicine

## 2016-02-22 ENCOUNTER — Emergency Department (HOSPITAL_COMMUNITY): Payer: Medicaid Other

## 2016-02-22 ENCOUNTER — Encounter (HOSPITAL_COMMUNITY): Payer: Self-pay | Admitting: *Deleted

## 2016-02-22 DIAGNOSIS — S79912A Unspecified injury of left hip, initial encounter: Secondary | ICD-10-CM | POA: Diagnosis not present

## 2016-02-22 DIAGNOSIS — Y9389 Activity, other specified: Secondary | ICD-10-CM | POA: Diagnosis not present

## 2016-02-22 DIAGNOSIS — S60511A Abrasion of right hand, initial encounter: Secondary | ICD-10-CM | POA: Insufficient documentation

## 2016-02-22 DIAGNOSIS — S7012XA Contusion of left thigh, initial encounter: Secondary | ICD-10-CM | POA: Diagnosis not present

## 2016-02-22 DIAGNOSIS — S199XXA Unspecified injury of neck, initial encounter: Secondary | ICD-10-CM | POA: Diagnosis present

## 2016-02-22 DIAGNOSIS — Y92410 Unspecified street and highway as the place of occurrence of the external cause: Secondary | ICD-10-CM | POA: Insufficient documentation

## 2016-02-22 DIAGNOSIS — Z8619 Personal history of other infectious and parasitic diseases: Secondary | ICD-10-CM | POA: Diagnosis not present

## 2016-02-22 DIAGNOSIS — Z79899 Other long term (current) drug therapy: Secondary | ICD-10-CM | POA: Insufficient documentation

## 2016-02-22 DIAGNOSIS — Y998 Other external cause status: Secondary | ICD-10-CM | POA: Insufficient documentation

## 2016-02-22 DIAGNOSIS — S161XXA Strain of muscle, fascia and tendon at neck level, initial encounter: Secondary | ICD-10-CM | POA: Diagnosis not present

## 2016-02-22 DIAGNOSIS — S0081XA Abrasion of other part of head, initial encounter: Secondary | ICD-10-CM | POA: Diagnosis not present

## 2016-02-22 LAB — I-STAT BETA HCG BLOOD, ED (MC, WL, AP ONLY)

## 2016-02-22 MED ORDER — CEPHALEXIN 500 MG PO CAPS: 500.0000 mg | ORAL_CAPSULE | Freq: Four times a day (QID) | ORAL | Status: DC

## 2016-02-22 MED ORDER — METHOCARBAMOL 500 MG PO TABS: 1000.0000 mg | ORAL_TABLET | Freq: Four times a day (QID) | ORAL | Status: DC
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
## 2016-03-10 ENCOUNTER — Emergency Department (HOSPITAL_COMMUNITY)
Admission: EM | Admit: 2016-03-10 | Discharge: 2016-03-10 | Disposition: A | Discharge status: Home / Self Care | Payer: Medicaid Other | Attending: Emergency Medicine | Admitting: Emergency Medicine

## 2016-03-10 ENCOUNTER — Encounter (HOSPITAL_COMMUNITY): Payer: Self-pay | Admitting: *Deleted

## 2016-03-10 DIAGNOSIS — Y939 Activity, unspecified: Secondary | ICD-10-CM | POA: Diagnosis not present

## 2016-03-10 DIAGNOSIS — Y999 Unspecified external cause status: Secondary | ICD-10-CM | POA: Insufficient documentation

## 2016-03-10 DIAGNOSIS — L03211 Cellulitis of face: Secondary | ICD-10-CM

## 2016-03-10 DIAGNOSIS — S0081XA Abrasion of other part of head, initial encounter: Secondary | ICD-10-CM | POA: Insufficient documentation

## 2016-03-10 DIAGNOSIS — Z79899 Other long term (current) drug therapy: Secondary | ICD-10-CM | POA: Diagnosis not present

## 2016-03-10 DIAGNOSIS — W57XXXA Bitten or stung by nonvenomous insect and other nonvenomous arthropods, initial encounter: Secondary | ICD-10-CM | POA: Diagnosis not present

## 2016-03-10 DIAGNOSIS — Y929 Unspecified place or not applicable: Secondary | ICD-10-CM | POA: Diagnosis not present

## 2016-03-10 DIAGNOSIS — S0086XA Insect bite (nonvenomous) of other part of head, initial encounter: Secondary | ICD-10-CM | POA: Diagnosis present

## 2016-03-10 MED ORDER — CLINDAMYCIN HCL 150 MG PO CAPS: 300.0000 mg | ORAL_CAPSULE | Freq: Three times a day (TID) | ORAL | Status: DC

## 2016-03-10 MED ORDER — TRAMADOL HCL 50 MG PO TABS: 50.0000 mg | ORAL_TABLET | Freq: Four times a day (QID) | ORAL | Status: DC | PRN

## 2016-03-10 MED ORDER — CLINDAMYCIN HCL 150 MG PO CAPS
300.0000 mg | ORAL_CAPSULE | Freq: Once | ORAL | Status: AC
  Administered 2016-03-10: 300 mg via ORAL

## 2016-03-10 NOTE — ED Notes (Signed)
Pt left at this time with all belongings.  

## 2016-03-10 NOTE — Discharge Instructions (Signed)
Cellulitis °Cellulitis is an infection of the skin and the tissue beneath it. The infected area is usually red and tender. Cellulitis occurs most often in the arms and lower legs.  °CAUSES  °Cellulitis is caused by bacteria that enter the skin through cracks or cuts in the skin. The most common types of bacteria that cause cellulitis are staphylococci and streptococci. °SIGNS AND SYMPTOMS  °· Redness and warmth. °· Swelling. °· Tenderness or pain. °· Fever. °DIAGNOSIS  °Your health care provider can usually determine what is wrong based on a physical exam. Blood tests may also be done. °TREATMENT  °Treatment usually involves taking an antibiotic medicine. °HOME CARE INSTRUCTIONS  °· Take your antibiotic medicine as directed by your health care provider. Finish the antibiotic even if you start to feel better. °· Keep the infected arm or leg elevated to reduce swelling. °· Apply a warm cloth to the affected area up to 4 times per day to relieve pain. °· Take medicines only as directed by your health care provider. °· Keep all follow-up visits as directed by your health care provider. °SEEK MEDICAL CARE IF:  °· You notice red streaks coming from the infected area. °· Your red area gets larger or turns dark in color. °· Your bone or joint underneath the infected area becomes painful after the skin has healed. °· Your infection returns in the same area or another area. °· You notice a swollen bump in the infected area. °· You develop new symptoms. °· You have a fever. °SEEK IMMEDIATE MEDICAL CARE IF:  °· You feel very sleepy. °· You develop vomiting or diarrhea. °· You have a general ill feeling (malaise) with muscle aches and pains. °  °This information is not intended to replace advice given to you by your health care provider. Make sure you discuss any questions you have with your health care provider. °  °Document Released: 06/15/2005 Document Revised: 05/27/2015 Document Reviewed: 11/21/2011 °Elsevier Interactive  Patient Education ©2016 Elsevier Inc. ° °Abscess °An abscess is an infected area that contains a collection of pus and debris. It can occur in almost any part of the body. An abscess is also known as a furuncle or boil. °CAUSES  °An abscess occurs when tissue gets infected. This can occur from blockage of oil or sweat glands, infection of hair follicles, or a minor injury to the skin. As the body tries to fight the infection, pus collects in the area and creates pressure under the skin. This pressure causes pain. People with weakened immune systems have difficulty fighting infections and get certain abscesses more often.  °SYMPTOMS °Usually an abscess develops on the skin and becomes a painful mass that is red, warm, and tender. If the abscess forms under the skin, you may feel a moveable soft area under the skin. Some abscesses break open (rupture) on their own, but most will continue to get worse without care. The infection can spread deeper into the body and eventually into the bloodstream, causing you to feel ill.  °DIAGNOSIS  °Your caregiver will take your medical history and perform a physical exam. A sample of fluid may also be taken from the abscess to determine what is causing your infection. °TREATMENT  °Your caregiver may prescribe antibiotic medicines to fight the infection. However, taking antibiotics alone usually does not cure an abscess. Your caregiver may need to make a small cut (incision) in the abscess to drain the pus. In some cases, gauze is packed into the abscess to reduce   pain and to continue draining the area. °HOME CARE INSTRUCTIONS  °· Only take over-the-counter or prescription medicines for pain, discomfort, or fever as directed by your caregiver. °· If you were prescribed antibiotics, take them as directed. Finish them even if you start to feel better. °· If gauze is used, follow your caregiver's directions for changing the gauze. °· To avoid spreading the infection: °¨ Keep your  draining abscess covered with a bandage. °¨ Wash your hands well. °¨ Do not share personal care items, towels, or whirlpools with others. °¨ Avoid skin contact with others. °· Keep your skin and clothes clean around the abscess. °· Keep all follow-up appointments as directed by your caregiver. °SEEK MEDICAL CARE IF:  °· You have increased pain, swelling, redness, fluid drainage, or bleeding. °· You have muscle aches, chills, or a general ill feeling. °· You have a fever. °MAKE SURE YOU:  °· Understand these instructions. °· Will watch your condition. °· Will get help right away if you are not doing well or get worse. °  °This information is not intended to replace advice given to you by your health care provider. Make sure you discuss any questions you have with your health care provider. °  °Document Released: 06/15/2005 Document Revised: 03/06/2012 Document Reviewed: 11/18/2011 °Elsevier Interactive Patient Education ©2016 Elsevier Inc. ° °

## 2016-03-10 NOTE — ED Notes (Signed)
Patient presents stating she thinks she may have been bitten by a spider last week and tonight her chin started itching and felt SOB

## 2016-03-10 NOTE — ED Provider Notes (Signed)
CSN: [id number]     Arrival date & time 03/10/16  0325 History   First MD Initiated Contact with Patient 03/10/16 0441     Chief Complaint  Patient presents with  . Insect Bite     (Consider location/radiation/quality/duration/timing/severity/associated sxs/prior Treatment) HPI   Pamela Jefferson is a 20 y.o. female presents to the ER with possible bug bit to her right chin that occurred one week ago, has been itching, swelling and having drainage of pus.  She states that the area has enlarged after itching and, and painful and she has been unable to sleep tonight.  She denies any redness, warmth, fever, chills, sweats. She states she has had no difficulty eating, breathing or talking. No trismus.  Past Medical History  Diagnosis Date  . Medical history non-contributory   . Gonorrhea   . Chlamydia    Past Surgical History  Procedure Laterality Date  . Fracture surgery      left arm   No family history on file. Social History  Substance Use Topics  . Smoking status: Never Smoker   . Smokeless tobacco: Never Used  . Alcohol Use: No   OB History    Gravida Para Term Preterm AB TAB SAB Ectopic Multiple Living   2 1 1       0 1     Review of Systems  All other systems reviewed and are negative.     Allergies  Shellfish allergy  Home Medications   Prior to Admission medications   Medication Sig Start Date End Date Taking? Authorizing Provider  cephALEXin (KEFLEX) 500 MG capsule Take 1 capsule (500 mg total) by mouth 4 (four) times daily. 02/22/16   Renne CriglerJoshua Geiple, PA-C  FLUoxetine (PROZAC) 20 MG capsule Take 1 capsule (20 mg total) by mouth daily. 01/23/16   Shuvon B Rankin, NP  hydrOXYzine (ATARAX/VISTARIL) 25 MG tablet Take 1 tablet (25 mg total) by mouth 3 (three) times daily as needed for anxiety. 01/23/16   Shuvon B Rankin, NP  methocarbamol (ROBAXIN) 500 MG tablet Take 2 tablets (1,000 mg total) by mouth 4 (four) times daily. 02/22/16   Renne CriglerJoshua Geiple, PA-C  traZODone  (DESYREL) 50 MG tablet Take 1 tablet (50 mg total) by mouth at bedtime. 01/23/16   Shuvon B Rankin, NP   BP 135/84 mmHg  Pulse 97  Temp(Src) 98.6 F (37 C) (Oral)  Resp 18  Ht 5\' 9"  (1.753 m)  Wt 83.065 kg  BMI 27.03 kg/m2  SpO2 100%  LMP 02/14/2016 Physical Exam  Constitutional: She is oriented to person, place, and time. Vital signs are normal. She appears well-developed and well-nourished. No distress.  HENT:  Head: Normocephalic and atraumatic.  Nose: Nose normal.  Mouth/Throat: Uvula is midline, oropharynx is clear and moist and mucous membranes are normal. Mucous membranes are not pale, not dry and not cyanotic. No trismus in the jaw. No uvula swelling. No oropharyngeal exudate.  No sublingual tenderness or fullness  Eyes: Conjunctivae and EOM are normal. Pupils are equal, round, and reactive to light. Right eye exhibits no discharge. Left eye exhibits no discharge. No scleral icterus.  Neck: Trachea normal, normal range of motion and phonation normal. Neck supple. No JVD present. No tracheal tenderness, no spinous process tenderness and no muscular tenderness present. No rigidity. No tracheal deviation, no edema, no erythema and normal range of motion present. No thyroid mass and no thyromegaly present.    Cardiovascular: Normal rate, regular rhythm, normal heart sounds and intact distal pulses.  Exam reveals no gallop and no friction rub.   No murmur heard. Pulmonary/Chest: Effort normal and breath sounds normal. No stridor. No respiratory distress. She has no wheezes. She has no rales. She exhibits no tenderness.  Abdominal: Soft. Bowel sounds are normal. She exhibits no distension and no mass. There is no tenderness. There is no rebound and no guarding.  Musculoskeletal: Normal range of motion.  Lymphadenopathy:    She has no cervical adenopathy.  Neurological: She is alert and oriented to person, place, and time. She has normal reflexes. No cranial nerve deficit. She exhibits  normal muscle tone. Coordination normal.  Skin: Skin is warm and dry. No rash noted. She is not diaphoretic. No erythema. No pallor.  Psychiatric: She has a normal mood and affect. Her behavior is normal. Judgment and thought content normal.  Nursing note and vitals reviewed.   ED Course  Procedures (including critical care time) Labs Review Labs Reviewed - No data to display  Imaging Review No results found. I have personally reviewed and evaluated these images and lab results as part of my medical decision-making.   EKG Interpretation None      MDM   Pt with bug bite to face one week ago, has been itching, swelling and draining. Open abrasion on right chin, no active drainage, no induration, fluctuance. Submental area on right with some fullness, no fluctuance, no induration, mildly tender.  May be cellulitis vs reactive lymph nodes.  Will cover with Abx, pt with have recheck in 2 days. No airway compromise, no sublingual tenderness.   Pt is well appearing, hemodynamically stable, feel she is safe to d/c home, but encouraged to return for recheck in 2 days.   Final diagnoses:  Cellulitis of chin     Danelle BerryLeisa Danielys Madry, PA-C 03/13/16 0036  Shon Batonourtney F Horton, MD 03/13/16 2020

## 2016-07-27 ENCOUNTER — Telehealth: Payer: Self-pay | Admitting: *Deleted

## 2016-07-27 NOTE — Telephone Encounter (Addendum)
Pt left message yesterday stating that she had a Mirena IUD removed about 6-8 months ago. She has been trying to get pregnant and has not been able to.  She stated that she had a baby before the Mirena was inserted and now believes that the Mirena "affected her insides" so that she cannot get pregnant. She has read that this can happen and wants to know why no one told her this before she got the Mirena. Pt is demanding an appointment to be seen and discuss why she cannot get pregnant.   11/16  Message left for pt stating that one of our staff will call her with an appt as she requested. Message sent to scheduling staff to contact pt.

## 2016-08-29 ENCOUNTER — Ambulatory Visit (INDEPENDENT_AMBULATORY_CARE_PROVIDER_SITE_OTHER): Payer: Medicaid Other

## 2016-08-29 DIAGNOSIS — Z3200 Encounter for pregnancy test, result unknown: Secondary | ICD-10-CM

## 2016-08-29 DIAGNOSIS — Z3201 Encounter for pregnancy test, result positive: Secondary | ICD-10-CM

## 2016-08-29 LAB — POCT URINE PREGNANCY: PREG TEST UR: POSITIVE — AB

## 2016-09-05 ENCOUNTER — Inpatient Hospital Stay (HOSPITAL_COMMUNITY): Payer: Medicaid Other

## 2016-09-05 ENCOUNTER — Encounter (HOSPITAL_COMMUNITY): Payer: Self-pay | Admitting: *Deleted

## 2016-09-05 ENCOUNTER — Inpatient Hospital Stay (HOSPITAL_COMMUNITY)
Admission: AD | Admit: 2016-09-05 | Discharge: 2016-09-05 | Disposition: A | Discharge status: Home / Self Care | Payer: Medicaid Other | Source: Ambulatory Visit | Attending: Family Medicine | Admitting: Family Medicine

## 2016-09-05 DIAGNOSIS — O26832 Pregnancy related renal disease, second trimester: Secondary | ICD-10-CM | POA: Diagnosis not present

## 2016-09-05 DIAGNOSIS — O219 Vomiting of pregnancy, unspecified: Secondary | ICD-10-CM | POA: Diagnosis not present

## 2016-09-05 DIAGNOSIS — O209 Hemorrhage in early pregnancy, unspecified: Secondary | ICD-10-CM

## 2016-09-05 DIAGNOSIS — Z91013 Allergy to seafood: Secondary | ICD-10-CM | POA: Diagnosis not present

## 2016-09-05 DIAGNOSIS — Z8619 Personal history of other infectious and parasitic diseases: Secondary | ICD-10-CM | POA: Diagnosis not present

## 2016-09-05 DIAGNOSIS — N189 Chronic kidney disease, unspecified: Secondary | ICD-10-CM | POA: Diagnosis not present

## 2016-09-05 DIAGNOSIS — Z3A01 Less than 8 weeks gestation of pregnancy: Secondary | ICD-10-CM | POA: Diagnosis not present

## 2016-09-05 LAB — CBC WITH DIFFERENTIAL/PLATELET
BASOS ABS: 0 10*3/uL (ref 0.0–0.1)
Basophils Relative: 0 %
Eosinophils Absolute: 0.3 10*3/uL (ref 0.0–0.7)
Eosinophils Relative: 3 %
HEMATOCRIT: 32.3 % — AB (ref 36.0–46.0)
Hemoglobin: 10.9 g/dL — ABNORMAL LOW (ref 12.0–15.0)
LYMPHS ABS: 2.4 10*3/uL (ref 0.7–4.0)
LYMPHS PCT: 26 %
MCH: 29.8 pg (ref 26.0–34.0)
MCHC: 33.7 g/dL (ref 30.0–36.0)
MCV: 88.3 fL (ref 78.0–100.0)
MONO ABS: 0.3 10*3/uL (ref 0.1–1.0)
Monocytes Relative: 4 %
NEUTROS ABS: 6.3 10*3/uL (ref 1.7–7.7)
Neutrophils Relative %: 67 %
Platelets: 357 10*3/uL (ref 150–400)
RBC: 3.66 MIL/uL — AB (ref 3.87–5.11)
RDW: 14.7 % (ref 11.5–15.5)
WBC: 9.3 10*3/uL (ref 4.0–10.5)

## 2016-09-05 LAB — URINALYSIS, ROUTINE W REFLEX MICROSCOPIC
BILIRUBIN URINE: NEGATIVE
Glucose, UA: NEGATIVE mg/dL
KETONES UR: NEGATIVE mg/dL
Nitrite: NEGATIVE
Protein, ur: 100 mg/dL — AB
SPECIFIC GRAVITY, URINE: 1.029 (ref 1.005–1.030)
pH: 6 (ref 5.0–8.0)

## 2016-09-05 LAB — WET PREP, GENITAL
Clue Cells Wet Prep HPF POC: NONE SEEN
SPERM: NONE SEEN
Trich, Wet Prep: NONE SEEN
YEAST WET PREP: NONE SEEN

## 2016-09-05 LAB — HCG, QUANTITATIVE, PREGNANCY: hCG, Beta Chain, Quant, S: 20491 m[IU]/mL — ABNORMAL HIGH (ref <5)

## 2016-09-05 NOTE — Discharge Instructions (Signed)
Vaginal Bleeding During Pregnancy, First Trimester °A small amount of bleeding (spotting) from the vagina is common in early pregnancy. Sometimes the bleeding is normal and is not a problem, and sometimes it is a sign of something serious. Be sure to tell your doctor about any bleeding from your vagina right away. °Follow these instructions at home: °· Watch your condition for any changes. °· Follow your doctor's instructions about how active you can be. °· If you are on bed rest: °¨ You may need to stay in bed and only get up to use the bathroom. °¨ You may be allowed to do some activities. °¨ If you need help, make plans for someone to help you. °· Write down: °¨ The number of pads you use each day. °¨ How often you change pads. °¨ How soaked (saturated) your pads are. °· Do not use tampons. °· Do not douche. °· Do not have sex or orgasms until your doctor says it is okay. °· If you pass any tissue from your vagina, save the tissue so you can show it to your doctor. °· Only take medicines as told by your doctor. °· Do not take aspirin because it can make you bleed. °· Keep all follow-up visits as told by your doctor. °Contact a doctor if: °· You bleed from your vagina. °· You have cramps. °· You have labor pains. °· You have a fever that does not go away after you take medicine. °Get help right away if: °· You have very bad cramps in your back or belly (abdomen). °· You pass large clots or tissue from your vagina. °· You bleed more. °· You feel light-headed or weak. °· You pass out (faint). °· You have chills. °· You are leaking fluid or have a gush of fluid from your vagina. °· You pass out while pooping (having a bowel movement). °This information is not intended to replace advice given to you by your health care provider. Make sure you discuss any questions you have with your health care provider. °Document Released: 01/20/2014 Document Revised: 02/11/2016 Document Reviewed: 05/13/2013 °Elsevier Interactive  Patient Education © 2017 Elsevier Inc. ° °

## 2016-09-05 NOTE — MAU Provider Note (Signed)
History     CSN: [id number]  Arrival date and time: 09/05/16 1650   First Provider Initiated Contact with Patient 09/05/16 1717      Chief Complaint  Patient presents with  . Vaginal Bleeding   HPI  [name] is a 20 y.o. G2P1001 at 4235w1d who presents to MAU today with complaint of vaginal bleeding since earlier today. The patient states that she noted a small amount of red blood with wiping today. She denies abdominal pain, vaginal discharge or fever. She has had N/V for the last few weeks, but denies diarrhea. She also denies UTI symptoms. She states last intercourse last night.    OB History    Gravida Para Term Preterm AB Living   2 1 1     1    SAB TAB Ectopic Multiple Live Births         0 1      Past Medical History:  Diagnosis Date  . Chlamydia   . Chronic kidney disease   . Gonorrhea   . Medical history non-contributory     Past Surgical History:  Procedure Laterality Date  . FRACTURE SURGERY     left arm    History reviewed. No pertinent family history.  Social History  Substance Use Topics  . Smoking status: Never Smoker  . Smokeless tobacco: Never Used  . Alcohol use No    Allergies:  Allergies  Allergen Reactions  . Shellfish Allergy Anaphylaxis    Prescriptions Prior to Admission  Medication Sig Dispense Refill Last Dose  . clindamycin (CLEOCIN) 150 MG capsule Take 2 capsules (300 mg total) by mouth 3 (three) times daily. May dispense as 150mg  capsules 60 capsule 0   . traMADol (ULTRAM) 50 MG tablet Take 1 tablet (50 mg total) by mouth every 6 (six) hours as needed. 10 tablet 0     Review of Systems  Constitutional: Negative for fever and malaise/fatigue.  Gastrointestinal: Positive for nausea and vomiting. Negative for abdominal pain, constipation and diarrhea.  Genitourinary: Negative for dysuria, frequency and urgency.       + vaginal bleeding   Physical Exam   Blood pressure 113/58, pulse 90, temperature 98.3 F (36.8  C), temperature source Oral, resp. rate 16, height 5\' 8"  (1.727 m), weight 172 lb (78 kg), last menstrual period 06/19/2016, unknown if currently breastfeeding.  Physical Exam  Nursing note and vitals reviewed. Constitutional: She is oriented to person, place, and time. She appears well-developed and well-nourished. No distress.  HENT:  Head: Normocephalic and atraumatic.  Cardiovascular: Normal rate.   Respiratory: Effort normal.  GI: Soft. She exhibits no distension and no mass. There is no tenderness. There is no rebound and no guarding.  Genitourinary: Uterus is enlarged (slightly). Uterus is not tender. Cervix exhibits no motion tenderness, no discharge and no friability. Right adnexum displays no mass and no tenderness. Left adnexum displays no mass and no tenderness. There is bleeding (scant blood noted) in the vagina. No vaginal discharge found.  Neurological: She is alert and oriented to person, place, and time.  Skin: Skin is warm and dry. No erythema.  Psychiatric: She has a normal mood and affect.  Dilation: Closed Effacement (%): Thick Cervical Position: Posterior Exam by:: Magnus SinningWenzel, PA-C   Results for orders placed or performed during the hospital encounter of 09/05/16 (from the past 48 hour(s))  Urinalysis, Routine w reflex microscopic     Status: Abnormal   Collection Time: 09/05/16  5:13 PM  Result Value  Ref Range   Color, Urine YELLOW YELLOW   APPearance CLOUDY (A) CLEAR   Specific Gravity, Urine 1.029 1.005 - 1.030   pH 6.0 5.0 - 8.0   Glucose, UA NEGATIVE NEGATIVE mg/dL   Hgb urine dipstick LARGE (A) NEGATIVE   Bilirubin Urine NEGATIVE NEGATIVE   Ketones, ur NEGATIVE NEGATIVE mg/dL   Protein, ur 161 (A) NEGATIVE mg/dL   Nitrite NEGATIVE NEGATIVE   Leukocytes, UA SMALL (A) NEGATIVE   RBC / HPF TOO NUMEROUS TO COUNT 0 - 5 RBC/hpf   WBC, UA 6-30 0 - 5 WBC/hpf   Bacteria, UA MANY (A) NONE SEEN   Squamous Epithelial / LPF 6-30 (A) NONE SEEN   Mucous PRESENT     Hyaline Casts, UA PRESENT    Sperm, UA PRESENT   Wet prep, genital     Status: Abnormal   Collection Time: 09/05/16  5:24 PM  Result Value Ref Range   Yeast Wet Prep HPF POC NONE SEEN NONE SEEN   Trich, Wet Prep NONE SEEN NONE SEEN   Clue Cells Wet Prep HPF POC NONE SEEN NONE SEEN   WBC, Wet Prep HPF POC MODERATE (A) NONE SEEN    Comment: BACTERIA- TOO NUMEROUS TO COUNT   Sperm NONE SEEN   CBC with Differential/Platelet     Status: Abnormal   Collection Time: 09/05/16  5:42 PM  Result Value Ref Range   WBC 9.3 4.0 - 10.5 K/uL   RBC 3.66 (L) 3.87 - 5.11 MIL/uL   Hemoglobin 10.9 (L) 12.0 - 15.0 g/dL   HCT 09.6 (L) 04.5 - 40.9 %   MCV 88.3 78.0 - 100.0 fL   MCH 29.8 26.0 - 34.0 pg   MCHC 33.7 30.0 - 36.0 g/dL   RDW 81.1 91.4 - 78.2 %   Platelets 357 150 - 400 K/uL   Neutrophils Relative % 67 %   Neutro Abs 6.3 1.7 - 7.7 K/uL   Lymphocytes Relative 26 %   Lymphs Abs 2.4 0.7 - 4.0 K/uL   Monocytes Relative 4 %   Monocytes Absolute 0.3 0.1 - 1.0 K/uL   Eosinophils Relative 3 %   Eosinophils Absolute 0.3 0.0 - 0.7 K/uL   Basophils Relative 0 %   Basophils Absolute 0.0 0.0 - 0.1 K/uL  hCG, quantitative, pregnancy     Status: Abnormal   Collection Time: 09/05/16  5:42 PM  Result Value Ref Range   hCG, Beta Chain, Quant, S 20,491 (H) <5 mIU/mL    Comment:          GEST. AGE      CONC.  (mIU/mL)   <=1 WEEK        5 - 50     2 WEEKS       50 - 500     3 WEEKS       100 - 10,000     4 WEEKS     1,000 - 30,000     5 WEEKS     3,500 - 115,000   6-8 WEEKS     12,000 - 270,000    12 WEEKS     15,000 - 220,000        FEMALE AND NON-PREGNANT FEMALE:     LESS THAN 5 mIU/mL    US Ob Comp Less 14 Wks  Result Date: 09/05/2016 CLINICAL DATA:  Pregnant patient with spotting. EXAM: OBSTETRIC <14 WK [place] AND TRANSVAGINAL OB US TECHNIQUE: Both transabdominal and transvaginal ultrasound examinations were performed for  complete evaluation of the gestation as well as the maternal uterus, adnexal  regions, and pelvic cul-de-sac. Transvaginal technique was performed to assess early pregnancy. COMPARISON:  None. FINDINGS: Intrauterine gestational sac: Single visualized. Yolk sac:  Not visualized. Embryo:  Visualized. Cardiac Activity: Not detected. CRL:  6  mm   6 w   3 d                  US EDC:  04/28/2017. Subchorionic hemorrhage:  None visualized. Maternal uterus/adnexae: Negative. IMPRESSION: Findings are suspicious but not yet definitive for failed pregnancy. Recommend follow-up US in 10-14 days for definitive diagnosis. This recommendation follows SRU consensus guidelines: Diagnostic Criteria for Nonviable Pregnancy Early in the First Trimester. Malva Limes Engl J Med 2013; 409:8119-14; 369:1443-51. Electronically Signed   By: Drusilla Kannerhomas  Dalessio M.D.   On: 09/05/2016 19:25   [place] Ob Transvaginal  Result Date: 09/05/2016 CLINICAL DATA:  Pregnant patient with spotting. EXAM: OBSTETRIC <14 WK US AND TRANSVAGINAL OB US TECHNIQUE: Both transabdominal and transvaginal ultrasound examinations were performed for complete evaluation of the gestation as well as the maternal uterus, adnexal regions, and pelvic cul-de-sac. Transvaginal technique was performed to assess early pregnancy. COMPARISON:  None. FINDINGS: Intrauterine gestational sac: Single visualized. Yolk sac:  Not visualized. Embryo:  Visualized. Cardiac Activity: Not detected. CRL:  6  mm   6 w   3 d                  US EDC:  04/28/2017. Subchorionic hemorrhage:  None visualized. Maternal uterus/adnexae: Negative. IMPRESSION: Findings are suspicious but not yet definitive for failed pregnancy. Recommend follow-up US in 10-14 days for definitive diagnosis. This recommendation follows SRU consensus guidelines: Diagnostic Criteria for Nonviable Pregnancy Early in the First Trimester. Malva Limes Engl J Med 2013; 782:9562-13; [id number]. Electronically Signed   By: Drusilla Kannerhomas  Dalessio M.D.   On: 09/05/2016 19:25     MAU Course  Procedures None  MDM Unable to obtain FHR with doppler UA, wet  prep, GC/Chlamydia, HIV, RPR, CBC, quant hCG and US today Urine culture pending Assessment and Plan  A: SIUP at 4475w3d without cardiac activity Vaginal bleeding in pregnancy prior to [redacted] weeks gestation  Size < dates  P: Discharge home Outpatient US ordered for 10 days to confirm viability. Patient will then go to [facility] for results Bleeding precautions discussed Patient advised to follow-up with [facility] or [place] to start prenatal care Patient may return to MAU as needed or if her condition were to change or worsen   Marny LowensteinJulie N Kedron Uno, PA-C  09/05/2016, 7:38 PM

## 2016-09-05 NOTE — MAU Note (Signed)
Pt noted blood with wiping about 15 minutes ago.  Denies pain.

## 2016-09-06 LAB — GC/CHLAMYDIA PROBE AMP (~~LOC~~) NOT AT ARMC
CHLAMYDIA, DNA PROBE: NEGATIVE
NEISSERIA GONORRHEA: POSITIVE — AB

## 2016-09-06 LAB — HIV ANTIBODY (ROUTINE TESTING W REFLEX): HIV SCREEN 4TH GENERATION: NONREACTIVE

## 2016-09-06 LAB — RPR: RPR: NONREACTIVE

## 2016-09-07 ENCOUNTER — Encounter (HOSPITAL_COMMUNITY): Payer: Self-pay | Admitting: *Deleted

## 2016-09-07 ENCOUNTER — Inpatient Hospital Stay (HOSPITAL_COMMUNITY)
Admission: AD | Admit: 2016-09-07 | Discharge: 2016-09-07 | Disposition: A | Discharge status: Home / Self Care | Payer: Medicaid Other | Source: Ambulatory Visit | Attending: Family Medicine | Admitting: Family Medicine

## 2016-09-07 ENCOUNTER — Inpatient Hospital Stay (HOSPITAL_COMMUNITY): Payer: Medicaid Other

## 2016-09-07 DIAGNOSIS — R109 Unspecified abdominal pain: Secondary | ICD-10-CM | POA: Diagnosis present

## 2016-09-07 DIAGNOSIS — O209 Hemorrhage in early pregnancy, unspecified: Secondary | ICD-10-CM

## 2016-09-07 DIAGNOSIS — O035 Genital tract and pelvic infection following complete or unspecified spontaneous abortion: Secondary | ICD-10-CM | POA: Insufficient documentation

## 2016-09-07 DIAGNOSIS — O039 Complete or unspecified spontaneous abortion without complication: Secondary | ICD-10-CM

## 2016-09-07 DIAGNOSIS — A549 Gonococcal infection, unspecified: Secondary | ICD-10-CM

## 2016-09-07 LAB — CULTURE, OB URINE

## 2016-09-07 MED ORDER — CEFTRIAXONE SODIUM 250 MG IJ SOLR
250.0000 mg | Freq: Once | INTRAMUSCULAR | Status: AC
  Administered 2016-09-07: 250 mg via INTRAMUSCULAR
  Filled 2016-09-07: qty 250

## 2016-09-07 MED ORDER — IBUPROFEN 600 MG PO TABS: 600.0000 mg | ORAL_TABLET | Freq: Four times a day (QID) | ORAL | 0 refills | Status: DC | PRN

## 2016-09-07 NOTE — MAU Note (Signed)
Woke up at like 0900, around 7 was having the pain.  When got up and used the bathroom, her drawers was full of blood.  Noted blood in toilet.  Stomach started hurting more, was cramping.

## 2016-09-07 NOTE — MAU Provider Note (Signed)
History     CSN: [id number]  Arrival date and time: 09/07/16 1112   First Provider Initiated Contact with Patient 09/07/16 1235      Chief Complaint  Patient presents with  . Abdominal Cramping  . Vaginal Bleeding   HPI   Ms.Bo Rogue is a 20 y.o. female G2P1001 @ [redacted]w[redacted]d here in MAU with vaginal bleeding and abdominal pain. She was seen for these same symptoms 2 days ago. US showed an IUP @ 6 weeks with no cardiac activity; she was scheduled to have a repeat US in 10 days. This morning the bleeding became heavy like a period. She has continued to have lower abdominal cramping.  Currently while here in MAU her bleeding has slowed down. She denies dizziness associated with the bleeding.   OB History    Gravida Para Term Preterm AB Living   2 1 1     1    SAB TAB Ectopic Multiple Live Births         0 1      Past Medical History:  Diagnosis Date  . Chlamydia   . Chronic kidney disease   . Gonorrhea   . Medical history non-contributory     Past Surgical History:  Procedure Laterality Date  . FRACTURE SURGERY     left arm    History reviewed. No pertinent family history.  Social History  Substance Use Topics  . Smoking status: Never Smoker  . Smokeless tobacco: Never Used  . Alcohol use No    Allergies:  Allergies  Allergen Reactions  . Shellfish Allergy Anaphylaxis    No prescriptions prior to admission.   Results for orders placed or performed during the hospital encounter of 09/05/16 (from the past 48 hour(s))  Urinalysis, Routine w reflex microscopic     Status: Abnormal   Collection Time: 09/05/16  5:13 PM  Result Value Ref Range   Color, Urine YELLOW YELLOW   APPearance CLOUDY (A) CLEAR   Specific Gravity, Urine 1.029 1.005 - 1.030   pH 6.0 5.0 - 8.0   Glucose, UA NEGATIVE NEGATIVE mg/dL   Hgb urine dipstick LARGE (A) NEGATIVE   Bilirubin Urine NEGATIVE NEGATIVE   Ketones, ur NEGATIVE NEGATIVE mg/dL   Protein, ur 409 (A) NEGATIVE mg/dL    Nitrite NEGATIVE NEGATIVE   Leukocytes, UA SMALL (A) NEGATIVE   RBC / HPF TOO NUMEROUS TO COUNT 0 - 5 RBC/hpf   WBC, UA 6-30 0 - 5 WBC/hpf   Bacteria, UA MANY (A) NONE SEEN   Squamous Epithelial / LPF 6-30 (A) NONE SEEN   Mucous PRESENT    Hyaline Casts, UA PRESENT    Sperm, UA PRESENT   Culture, OB Urine     Status: Abnormal   Collection Time: 09/05/16  5:15 PM  Result Value Ref Range   Specimen Description OB CLEAN CATCH    Special Requests NONE    Culture (A)     MULTIPLE SPECIES PRESENT, SUGGEST RECOLLECTION NO GROUP B STREP (S.AGALACTIAE) ISOLATED Performed at [facility]    Report Status 09/07/2016 FINAL   Wet prep, genital     Status: Abnormal   Collection Time: 09/05/16  5:24 PM  Result Value Ref Range   Yeast Wet Prep HPF POC NONE SEEN NONE SEEN   Trich, Wet Prep NONE SEEN NONE SEEN   Clue Cells Wet Prep HPF POC NONE SEEN NONE SEEN   WBC, Wet Prep HPF POC MODERATE (A) NONE SEEN    Comment: BACTERIA-  TOO NUMEROUS TO COUNT   Sperm NONE SEEN   RPR     Status: None   Collection Time: 09/05/16  5:42 PM  Result Value Ref Range   RPR Ser Ql Non Reactive Non Reactive    Comment: (NOTE) Performed At: [facility] [address] [place], [place] [id number] Mila Homer MD [id number]   HIV antibody     Status: None   Collection Time: 09/05/16  5:42 PM  Result Value Ref Range   HIV Screen 4th Generation wRfx Non Reactive Non Reactive    Comment: (NOTE) Performed At: [facility] [address] [place], [place] [id number] Mila Homer MD [id number]   CBC with Differential/Platelet     Status: Abnormal   Collection Time: 09/05/16  5:42 PM  Result Value Ref Range   WBC 9.3 4.0 - 10.5 K/uL   RBC 3.66 (L) 3.87 - 5.11 MIL/uL   Hemoglobin 10.9 (L) 12.0 - 15.0 g/dL   HCT 53.6 (L) 64.4 - 03.4 %   MCV 88.3 78.0 - 100.0 fL   MCH 29.8 26.0 - 34.0 pg   MCHC 33.7 30.0 - 36.0 g/dL   RDW 74.2 59.5 - 63.8 %   Platelets 357 150 - 400 K/uL    Neutrophils Relative % 67 %   Neutro Abs 6.3 1.7 - 7.7 K/uL   Lymphocytes Relative 26 %   Lymphs Abs 2.4 0.7 - 4.0 K/uL   Monocytes Relative 4 %   Monocytes Absolute 0.3 0.1 - 1.0 K/uL   Eosinophils Relative 3 %   Eosinophils Absolute 0.3 0.0 - 0.7 K/uL   Basophils Relative 0 %   Basophils Absolute 0.0 0.0 - 0.1 K/uL  hCG, quantitative, pregnancy     Status: Abnormal   Collection Time: 09/05/16  5:42 PM  Result Value Ref Range   hCG, Beta Chain, Quant, S 20,491 (H) <5 mIU/mL    Comment:          GEST. AGE      CONC.  (mIU/mL)   <=1 WEEK        5 - 50     2 WEEKS       50 - 500     3 WEEKS       100 - 10,000     4 WEEKS     1,000 - 30,000     5 WEEKS     3,500 - 115,000   6-8 WEEKS     12,000 - 270,000    12 WEEKS     15,000 - 220,000        FEMALE AND NON-PREGNANT FEMALE:     LESS THAN 5 mIU/mL     US Ob Comp Less 14 Wks  Result Date: 09/05/2016 CLINICAL DATA:  Pregnant patient with spotting. EXAM: OBSTETRIC <14 WK [place] AND TRANSVAGINAL OB US TECHNIQUE: Both transabdominal and transvaginal ultrasound examinations were performed for complete evaluation of the gestation as well as the maternal uterus, adnexal regions, and pelvic cul-de-sac. Transvaginal technique was performed to assess early pregnancy. COMPARISON:  None. FINDINGS: Intrauterine gestational sac: Single visualized. Yolk sac:  Not visualized. Embryo:  Visualized. Cardiac Activity: Not detected. CRL:  6  mm   6 w   3 d                  [place] EDC:  04/28/2017. Subchorionic hemorrhage:  None visualized. Maternal uterus/adnexae: Negative. IMPRESSION: Findings are suspicious but not yet definitive for failed pregnancy. Recommend  follow-up US in 10-14 days for definitive diagnosis. This recommendation follows SRU consensus guidelines: Diagnostic Criteria for Nonviable Pregnancy Early in the First Trimester. Malva Limes Engl J Med 2013; 829:5621-30; 369:1443-51. Electronically Signed   By: Drusilla Kannerhomas  Dalessio M.D.   On: 09/05/2016 19:25   [place] Ob  Transvaginal  Result Date: 09/07/2016 CLINICAL DATA:  20 year old pregnant female presents with 2 days of vaginal bleeding and pelvic cramping. Intrauterine gestation with no embryonic cardiac activity on obstetric scan from 2 days prior. EDC by LMP: 03/26/2017, projecting to an expected gestational age of [redacted] weeks 3 days. EXAM: TRANSVAGINAL OB ULTRASOUND TECHNIQUE: Transvaginal ultrasound was performed for complete evaluation of the gestation as well as the maternal uterus, adnexal regions, and pelvic cul-de-sac. COMPARISON:  09/05/2016 obstetric scan. FINDINGS: Intrauterine gestational sac: Single intrauterine gestational sac is normal in position and slightly irregular in contour. Yolk sac:  Not Visualized. Embryo: Not Visualized. Previously described 6.3 mm embryo on the 09/05/2016 scan is absent on this scan. Embryonic Cardiac Activity: Not Visualized. MSD: 23  mm   7 w   1  d, previously 20 mm on 09/05/2016 Subchorionic hemorrhage:  None visualized. Maternal uterus/adnexae: Right ovary measures 3.0 x 2.0 x 2.0 cm. Left ovary measures 3.0 x 1.7 x 1.7 cm. No suspicious ovarian or adnexal masses. No abnormal free fluid in the pelvis. No uterine fibroids are demonstrated. IMPRESSION: 1. Single intrauterine gestational sac is irregular in contour. Previously visualized embryo within the intrauterine gestational sac on the 09/05/2016 scan is absent on this scan. Findings are definitive for intrauterine pregnancy demise. 2. No suspicious ovarian or adnexal findings. Electronically Signed   By: Delbert PhenixJason A Poff M.D.   On: 09/07/2016 12:35   [place] Ob Transvaginal  Result Date: 09/05/2016 CLINICAL DATA:  Pregnant patient with spotting. EXAM: OBSTETRIC <14 WK US AND TRANSVAGINAL OB US TECHNIQUE: Both transabdominal and transvaginal ultrasound examinations were performed for complete evaluation of the gestation as well as the maternal uterus, adnexal regions, and pelvic cul-de-sac. Transvaginal technique was performed to  assess early pregnancy. COMPARISON:  None. FINDINGS: Intrauterine gestational sac: Single visualized. Yolk sac:  Not visualized. Embryo:  Visualized. Cardiac Activity: Not detected. CRL:  6  mm   6 w   3 d                  US EDC:  04/28/2017. Subchorionic hemorrhage:  None visualized. Maternal uterus/adnexae: Negative. IMPRESSION: Findings are suspicious but not yet definitive for failed pregnancy. Recommend follow-up US in 10-14 days for definitive diagnosis. This recommendation follows SRU consensus guidelines: Diagnostic Criteria for Nonviable Pregnancy Early in the First Trimester. Malva Limes Engl J Med 2013; [id number]; 369:1443-51. Electronically Signed   By: Drusilla Kannerhomas  Dalessio M.D.   On: 09/05/2016 19:25    Review of Systems  Constitutional: Negative for chills and fever.  Gastrointestinal: Positive for abdominal pain. Negative for nausea and vomiting.   Physical Exam   Blood pressure 113/59, pulse 88, temperature 98.1 F (36.7 C), temperature source Oral, resp. rate 16, weight 170 lb 6.4 oz (77.3 kg), last menstrual period 06/19/2016, unknown if currently breastfeeding.  Physical Exam  Constitutional: She is oriented to person, place, and time. She appears well-developed and well-nourished. No distress.  HENT:  Head: Normocephalic.  Eyes: Pupils are equal, round, and reactive to light.  Respiratory: Effort normal.  Musculoskeletal: Normal range of motion.  Neurological: She is alert and oriented to person, place, and time.  Skin: Skin is warm. She is not diaphoretic.  Psychiatric: Her behavior  is normal.    MAU Course  Procedures  None  MDM  B positive blood type  Repeat US  Gonorrhea positive 12/18- Rocephin 250 mg IM given   Assessment and Plan   A:  1. SAB (spontaneous abortion)   2. Vaginal bleeding in pregnancy, first trimester   3. Gonorrhea     P:  Discharge home in stable condition Patient [facility] Quant Return to MAU if symptoms worsen   Bleeding precautions  Duane LopeJennifer I Rasch, NP 09/07/2016 4:32 PM

## 2016-09-07 NOTE — MAU Note (Signed)
Urine sent to lab 

## 2016-09-07 NOTE — Discharge Instructions (Signed)

## 2016-09-07 NOTE — MAU Note (Signed)
Patient signed pap form for D/C instrs. E sig not working.

## 2016-09-09 ENCOUNTER — Inpatient Hospital Stay (HOSPITAL_COMMUNITY)
Admission: AD | Admit: 2016-09-09 | Discharge: 2016-09-09 | Disposition: A | Discharge status: Home / Self Care | Payer: Medicaid Other | Source: Ambulatory Visit | Attending: Obstetrics and Gynecology | Admitting: Obstetrics and Gynecology

## 2016-09-09 ENCOUNTER — Encounter (HOSPITAL_COMMUNITY): Payer: Self-pay | Admitting: Student

## 2016-09-09 ENCOUNTER — Inpatient Hospital Stay (HOSPITAL_COMMUNITY): Payer: Medicaid Other

## 2016-09-09 DIAGNOSIS — D649 Anemia, unspecified: Secondary | ICD-10-CM

## 2016-09-09 DIAGNOSIS — O26891 Other specified pregnancy related conditions, first trimester: Secondary | ICD-10-CM | POA: Diagnosis present

## 2016-09-09 DIAGNOSIS — R109 Unspecified abdominal pain: Secondary | ICD-10-CM | POA: Diagnosis present

## 2016-09-09 DIAGNOSIS — O039 Complete or unspecified spontaneous abortion without complication: Secondary | ICD-10-CM | POA: Diagnosis not present

## 2016-09-09 DIAGNOSIS — Z3A01 Less than 8 weeks gestation of pregnancy: Secondary | ICD-10-CM | POA: Diagnosis not present

## 2016-09-09 DIAGNOSIS — O209 Hemorrhage in early pregnancy, unspecified: Secondary | ICD-10-CM | POA: Diagnosis present

## 2016-09-09 LAB — CBC
HEMATOCRIT: 27.9 % — AB (ref 36.0–46.0)
HEMOGLOBIN: 9.3 g/dL — AB (ref 12.0–15.0)
MCH: 30.1 pg (ref 26.0–34.0)
MCHC: 33.3 g/dL (ref 30.0–36.0)
MCV: 90.3 fL (ref 78.0–100.0)
Platelets: 356 10*3/uL (ref 150–400)
RBC: 3.09 MIL/uL — AB (ref 3.87–5.11)
RDW: 15 % (ref 11.5–15.5)
WBC: 9.9 10*3/uL (ref 4.0–10.5)

## 2016-09-09 LAB — URINALYSIS, ROUTINE W REFLEX MICROSCOPIC
Bilirubin Urine: NEGATIVE
GLUCOSE, UA: NEGATIVE mg/dL
Hgb urine dipstick: NEGATIVE
KETONES UR: NEGATIVE mg/dL
Leukocytes, UA: NEGATIVE
Nitrite: NEGATIVE
PH: 5 (ref 5.0–8.0)
Protein, ur: 30 mg/dL — AB
SPECIFIC GRAVITY, URINE: 1.032 — AB (ref 1.005–1.030)

## 2016-09-09 MED ORDER — KETOROLAC TROMETHAMINE 60 MG/2ML IM SOLN
60.0000 mg | Freq: Once | INTRAMUSCULAR | Status: AC
  Administered 2016-09-09: 60 mg via INTRAMUSCULAR
  Filled 2016-09-09: qty 2

## 2016-09-09 MED ORDER — OXYCODONE-ACETAMINOPHEN 5-325 MG PO TABS: 1.0000 | ORAL_TABLET | ORAL | 0 refills | Status: DC | PRN

## 2016-09-09 MED ORDER — FERROUS SULFATE 325 (65 FE) MG PO TABS: 325.0000 mg | ORAL_TABLET | Freq: Every day | ORAL | 0 refills | Status: DC

## 2016-09-09 NOTE — MAU Provider Note (Signed)
History     CSN: [id number]  Arrival date and time: 09/09/16 2100   First Provider Initiated Contact with Patient 09/09/16 2131      Chief Complaint  Patient presents with  . Vaginal Bleeding  . Abdominal Pain   HPI Pamela Jefferson is a 20 y.o. G2P1001 at 5498w0d who presents with vaginal bleeding & abdominal pain. Patient was diagnosed with miscarriage 2 days ago. Reports increase in bleeding & pain for the last 2 days. States she saturated 2 pads in 15 minutes this morning & passed several large clots this afternoon but the bleeding has slowed down since then. Reports lower abdominal cramping that she rates 8/10. Has not taken anything for pain today. Last took ibuprofen yesterday & stopped taking it d/t family members telling her that it's a blood thinner & she shouldn't be taking it while she's bleeding.  Patient playing with phone during interview.   OB History    Gravida Para Term Preterm AB Living   2 1 1     1    SAB TAB Ectopic Multiple Live Births         0 1      Past Medical History:  Diagnosis Date  . Chlamydia   . Gonorrhea     Past Surgical History:  Procedure Laterality Date  . FRACTURE SURGERY     left arm    History reviewed. No pertinent family history.  Social History  Substance Use Topics  . Smoking status: Never Smoker  . Smokeless tobacco: Never Used  . Alcohol use No    Allergies:  Allergies  Allergen Reactions  . Shellfish Allergy Anaphylaxis    Prescriptions Prior to Admission  Medication Sig Dispense Refill Last Dose  . ibuprofen (ADVIL,MOTRIN) 600 MG tablet Take 1 tablet (600 mg total) by mouth every 6 (six) hours as needed. 30 tablet 0 09/08/2016 at Unknown time    Review of Systems  Constitutional: Negative.   Gastrointestinal: Positive for abdominal pain. Negative for constipation, diarrhea, nausea and vomiting.  Genitourinary: Negative for dysuria.       + vaginal bleeding   Physical Exam   Blood pressure 117/65, pulse 82,  temperature 98.2 F (36.8 C), temperature source Oral, resp. rate 16, last menstrual period 06/19/2016, SpO2 100 %, unknown if currently breastfeeding.  Physical Exam  Nursing note and vitals reviewed. Constitutional: She is oriented to person, place, and time. She appears well-developed and well-nourished. No distress.  HENT:  Head: Normocephalic and atraumatic.  Eyes: Conjunctivae are normal. Right eye exhibits no discharge. Left eye exhibits no discharge. No scleral icterus.  Neck: Normal range of motion.  Cardiovascular: Normal rate.   Respiratory: Effort normal. No respiratory distress.  GI: Soft.  Genitourinary: Uterus normal. Cervix exhibits no motion tenderness and no friability. There is bleeding (minimal amount of pink blood -- no active bleeding) in the vagina.  Genitourinary Comments: Cervix closed  Neurological: She is alert and oriented to person, place, and time.  Skin: Skin is warm and dry. She is not diaphoretic.  Psychiatric: She has a normal mood and affect. Her behavior is normal. Judgment and thought content normal.    MAU Course  Procedures Results for orders placed or performed during the hospital encounter of 09/09/16 (from the past 24 hour(s))  Urinalysis, Routine w reflex microscopic     Status: Abnormal   Collection Time: 09/09/16  9:05 PM  Result Value Ref Range   Color, Urine YELLOW YELLOW   APPearance HAZY (A)  CLEAR   Specific Gravity, Urine 1.032 (H) 1.005 - 1.030   pH 5.0 5.0 - 8.0   Glucose, UA NEGATIVE NEGATIVE mg/dL   Hgb urine dipstick NEGATIVE NEGATIVE   Bilirubin Urine NEGATIVE NEGATIVE   Ketones, ur NEGATIVE NEGATIVE mg/dL   Protein, ur 30 (A) NEGATIVE mg/dL   Nitrite NEGATIVE NEGATIVE   Leukocytes, UA NEGATIVE NEGATIVE   RBC / HPF 0-5 0 - 5 RBC/hpf   WBC, UA 0-5 0 - 5 WBC/hpf   Bacteria, UA RARE (A) NONE SEEN   Squamous Epithelial / LPF 0-5 (A) NONE SEEN   Mucous PRESENT   CBC     Status: Abnormal   Collection Time: 09/09/16  9:49 PM   Result Value Ref Range   WBC 9.9 4.0 - 10.5 K/uL   RBC 3.09 (L) 3.87 - 5.11 MIL/uL   Hemoglobin 9.3 (L) 12.0 - 15.0 g/dL   HCT 40.927.9 (L) 81.136.0 - 91.446.0 %   MCV 90.3 78.0 - 100.0 fL   MCH 30.1 26.0 - 34.0 pg   MCHC 33.3 30.0 - 36.0 g/dL   RDW 78.215.0 95.611.5 - 21.315.5 %   Platelets 356 150 - 400 K/uL   [place] Ob Transvaginal  Result Date: 09/09/2016 CLINICAL DATA:  Bleeding and cramping. EXAM: TRANSVAGINAL OB ULTRASOUND TECHNIQUE: Transvaginal ultrasound was performed for complete evaluation of the gestation as well as the maternal uterus, adnexal regions, and pelvic cul-de-sac. COMPARISON:  09/07/2016 FINDINGS: Intrauterine gestational sac: None Yolk sac: Embryo: Cardiac Activity: Heart Rate:  bpm MSD:   mm    w     d CRL:     mm    w  d                  US EDC: Subchorionic hemorrhage:  None visualized. Maternal uterus/adnexae: Normal ovaries. Endometrial cavity is inhomogeneous, with hemorrhage or retained products of conception. IMPRESSION: No remaining gestational sac, but there is irregular hemorrhage or products of conception within the endometrial canal. Endometrial thickness is 12.6 mm. Electronically Signed   By: Ellery Plunkaniel R Mitchell M.D.   On: 09/09/2016 22:36    MDM B positive VSS Minimal bleeding on exam CBC -- hemoglobin has dropped some -- will start iron supplements Ultrasound -- gestational sac previously seen no longer there Toradol 60 mg IM -- pt reports resolution of pain Assessment and Plan  A: 1. Anemia, unspecified type   2. Miscarriage    P; Discharge home Rx percocet 10# & ferrous sulfate Discussed reasons to return to MAU Pelvic rest [facility]  [name] 09/09/2016, 9:31 PM

## 2016-09-09 NOTE — Discharge Instructions (Signed)
Miscarriage A miscarriage is the sudden loss of an unborn baby (fetus) before the 20th week of pregnancy. Most miscarriages happen in the first 3 months of pregnancy. Sometimes, it happens before a woman even knows she is pregnant. A miscarriage is also called a "spontaneous miscarriage" or "early pregnancy loss." Having a miscarriage can be an emotional experience. Talk with your caregiver about any questions you may have about miscarrying, the grieving process, and your future pregnancy plans. What are the causes?  Problems with the fetal chromosomes that make it impossible for the baby to develop normally. Problems with the baby's genes or chromosomes are most often the result of errors that occur, by chance, as the embryo divides and grows. The problems are not inherited from the parents.  Infection of the cervix or uterus.  Hormone problems.  Problems with the cervix, such as having an incompetent cervix. This is when the tissue in the cervix is not strong enough to hold the pregnancy.  Problems with the uterus, such as an abnormally shaped uterus, uterine fibroids, or congenital abnormalities.  Certain medical conditions.  Smoking, drinking alcohol, or taking illegal drugs.  Trauma. Often, the cause of a miscarriage is unknown. What are the signs or symptoms?  Vaginal bleeding or spotting, with or without cramps or pain.  Pain or cramping in the abdomen or lower back.  Passing fluid, tissue, or blood clots from the vagina. How is this diagnosed? Your caregiver will perform a physical exam. You may also have an ultrasound to confirm the miscarriage. Blood or urine tests may also be ordered. How is this treated?  Sometimes, treatment is not necessary if you naturally pass all the fetal tissue that was in the uterus. If some of the fetus or placenta remains in the body (incomplete miscarriage), tissue left behind may become infected and must be removed. Usually, a dilation and  curettage (D and C) procedure is performed. During a D and C procedure, the cervix is widened (dilated) and any remaining fetal or placental tissue is gently removed from the uterus.  Antibiotic medicines are prescribed if there is an infection. Other medicines may be given to reduce the size of the uterus (contract) if there is a lot of bleeding.  If you have Rh negative blood and your baby was Rh positive, you will need a Rh immunoglobulin shot. This shot will protect any future baby from having Rh blood problems in future pregnancies. Follow these instructions at home:  Your caregiver may order bed rest or may allow you to continue light activity. Resume activity as directed by your caregiver.  Have someone help with home and family responsibilities during this time.  Keep track of the number of sanitary pads you use each day and how soaked (saturated) they are. Write down this information.  Do not use tampons. Do not douche or have sexual intercourse until approved by your caregiver.  Only take over-the-counter or prescription medicines for pain or discomfort as directed by your caregiver.  Do not take aspirin. Aspirin can cause bleeding.  Keep all follow-up appointments with your caregiver.  If you or your partner have problems with grieving, talk to your caregiver or seek counseling to help cope with the pregnancy loss. Allow enough time to grieve before trying to get pregnant again. Get help right away if:  You have severe cramps or pain in your back or abdomen.  You have a fever.  You pass large blood clots (walnut-sized or larger) ortissue from your  vagina. Save any tissue for your caregiver to inspect.  Your bleeding increases.  You have a thick, bad-smelling vaginal discharge.  You become lightheaded, weak, or you faint.  You have chills. This information is not intended to replace advice given to you by your health care provider. Make sure you discuss any questions  you have with your health care provider. Document Released: 03/01/2001 Document Revised: 02/11/2016 Document Reviewed: 10/25/2011 Elsevier Interactive Patient Education  2017 Elsevier Inc. Anemia, Nonspecific Anemia is a condition in which the concentration of red blood cells or hemoglobin in the blood is below normal. Hemoglobin is a substance in red blood cells that carries oxygen to the tissues of the body. Anemia results in not enough oxygen reaching these tissues. What are the causes? Common causes of anemia include:  Excessive bleeding. Bleeding may be internal or external. This includes excessive bleeding from periods (in women) or from the intestine.  Poor nutrition.  Chronic kidney, thyroid, and liver disease.  Bone marrow disorders that decrease red blood cell production.  Cancer and treatments for cancer.  HIV, AIDS, and their treatments.  Spleen problems that increase red blood cell destruction.  Blood disorders.  Excess destruction of red blood cells due to infection, medicines, and autoimmune disorders. What are the signs or symptoms?  Minor weakness.  Dizziness.  Headache.  Palpitations.  Shortness of breath, especially with exercise.  Paleness.  Cold sensitivity.  Indigestion.  Nausea.  Difficulty sleeping.  Difficulty concentrating. Symptoms may occur suddenly or they may develop slowly. How is this diagnosed? Additional blood tests are often needed. These help your health care provider determine the best treatment. Your health care provider will check your stool for blood and look for other causes of blood loss. How is this treated? Treatment varies depending on the cause of the anemia. Treatment can include:  Supplements of iron, vitamin B12, or folic acid.  Hormone medicines.  A blood transfusion. This may be needed if blood loss is severe.  Hospitalization. This may be needed if there is significant continual blood loss.  Dietary  changes.  Spleen removal. Follow these instructions at home: Keep all follow-up appointments. It often takes many weeks to correct anemia, and having your health care provider check on your condition and your response to treatment is very important. Get help right away if:  You develop extreme weakness, shortness of breath, or chest pain.  You become dizzy or have trouble concentrating.  You develop heavy vaginal bleeding.  You develop a rash.  You have bloody or black, tarry stools.  You faint.  You vomit up blood.  You vomit repeatedly.  You have abdominal pain.  You have a fever or persistent symptoms for more than 2-3 days.  You have a fever and your symptoms suddenly get worse.  You are dehydrated. This information is not intended to replace advice given to you by your health care provider. Make sure you discuss any questions you have with your health care provider. Document Released: 10/13/2004 Document Revised: 02/17/2016 Document Reviewed: 03/01/2013 Elsevier Interactive Patient Education  2017 [employer]

## 2016-09-09 NOTE — MAU Note (Signed)
Came on 18th and 20th and was told had a miscarriage. Went to doctor visit yesterday [facility].  Changing pad every 30 min yesterday and every hour to two hours today.  Having large long clots size of hand each time changes pad. Lower abd pain cramping a lot. Was using Ibruprofen but wasn't helping. Tried heating pad.

## 2016-09-15 ENCOUNTER — Ambulatory Visit (HOSPITAL_COMMUNITY): Payer: Medicaid Other

## 2016-09-16 ENCOUNTER — Ambulatory Visit (HOSPITAL_COMMUNITY): Admission: RE | Admit: 2016-09-16 | Payer: Medicaid Other | Source: Ambulatory Visit

## 2016-09-21 ENCOUNTER — Other Ambulatory Visit: Payer: Medicaid Other

## 2016-09-23 ENCOUNTER — Encounter: Payer: Self-pay | Admitting: Certified Nurse Midwife

## 2017-05-29 ENCOUNTER — Emergency Department (HOSPITAL_COMMUNITY)
Admission: EM | Admit: 2017-05-29 | Discharge: 2017-05-29 | Disposition: A | Discharge status: Home / Self Care | Payer: Medicaid Other | Attending: Emergency Medicine | Admitting: Emergency Medicine

## 2017-05-29 ENCOUNTER — Encounter (HOSPITAL_COMMUNITY): Payer: Self-pay | Admitting: *Deleted

## 2017-05-29 DIAGNOSIS — T171XXA Foreign body in nostril, initial encounter: Secondary | ICD-10-CM | POA: Diagnosis not present

## 2017-05-29 DIAGNOSIS — Y929 Unspecified place or not applicable: Secondary | ICD-10-CM | POA: Diagnosis not present

## 2017-05-29 DIAGNOSIS — W228XXA Striking against or struck by other objects, initial encounter: Secondary | ICD-10-CM | POA: Insufficient documentation

## 2017-05-29 DIAGNOSIS — Y999 Unspecified external cause status: Secondary | ICD-10-CM | POA: Insufficient documentation

## 2017-05-29 DIAGNOSIS — Y939 Activity, unspecified: Secondary | ICD-10-CM | POA: Insufficient documentation

## 2017-05-29 NOTE — ED Notes (Signed)
States she got her nose pierced and 2 days ago , states did not get any instructions on how to care for it she said and now she feels it is swollen and that the back is stuck in her nose

## 2017-05-29 NOTE — ED Provider Notes (Signed)
[facility] Provider Note   CSN: [id number] Arrival date & time: 05/29/17  1109     History   Chief Complaint Chief Complaint  Patient presents with  . Wound Infection    HPI Pamela Jefferson is a 21 y.o. female.  HPI   21 year old female with nasal piercing stuck in her nose. Please 2 days ago. The piercing has retracted into the tissue and is not visualized externally. She is having pain and mild swelling at the site. No fevers or chills. No discharge.  Past Medical History:  Diagnosis Date  . Chlamydia   . Gonorrhea     Patient Active Problem List   Diagnosis Date Noted  . Depression   . Suicidal ideation   . Suicidal ideations   . 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 MDD (major depressive disorder), single episode, severe (HCC) 01/20/2016    Past Surgical History:  Procedure Laterality Date  . FRACTURE SURGERY     left arm    OB History    Gravida Para Term Preterm AB Living   2 1 1     1    SAB TAB Ectopic Multiple Live Births         0 1       Home Medications    Prior to Admission medications   Medication Sig Start Date End Date Taking? Authorizing Provider  ferrous sulfate 325 (65 FE) MG tablet Take 1 tablet (325 mg total) by mouth daily with breakfast. 09/09/16   Judeth HornLawrence, Erin, NP  ibuprofen (ADVIL,MOTRIN) 600 MG tablet Take 1 tablet (600 mg total) by mouth every 6 (six) hours as needed. 09/07/16   Rasch, Victorino DikeJennifer I, NP  oxyCODONE-acetaminophen (PERCOCET/ROXICET) 5-325 MG tablet Take 1 tablet by mouth every 4 (four) hours as needed. 09/09/16   Judeth HornLawrence, Erin, NP    Family History No family history on file.  Social History Social History  Substance Use Topics  . Smoking status: Never Smoker  . Smokeless tobacco: Never Used  . Alcohol use No     Allergies   Shellfish allergy   Review of Systems Review of Systems  All systems reviewed and negative, other than as noted in HPI.  Physical  Exam Updated Vital Signs BP 117/66 (BP Location: Right Arm)   Pulse 91   Temp 98.3 F (36.8 C) (Oral)   Resp 16   LMP 05/04/2017 (Approximate)   SpO2 100%   Breastfeeding? Unknown   Physical Exam  Constitutional: She appears well-developed and well-nourished. No distress.  HENT:  Head: Normocephalic.  Nasal piercing embedded in the right nose. Part of the earring can be visualized nasal cavity.  Eyes: Conjunctivae are normal. Right eye exhibits no discharge. Left eye exhibits no discharge.  Neck: Neck supple.  Cardiovascular: Normal rate, regular rhythm and normal heart sounds.  Exam reveals no gallop and no friction rub.   No murmur heard. Pulmonary/Chest: Effort normal and breath sounds normal. No respiratory distress.  Abdominal: Soft. She exhibits no distension. There is no tenderness.  Musculoskeletal: She exhibits no edema or tenderness.  Neurological: She is alert.  Skin: Skin is warm and dry.  Psychiatric: She has a normal mood and affect. Her behavior is normal. Thought content normal.  Nursing note and vitals reviewed.    ED Treatments / Results  Labs (all labs ordered are listed, but only abnormal results are displayed) Labs Reviewed - No data to display  EKG  EKG Interpretation None  Radiology No results found.  Procedures .Foreign Body Removal Date/Time: 05/29/2017 12:43 PM Performed by: Raeford Razor Authorized by: Raeford Razor  Consent: Verbal consent obtained. Consent given by: patient Body area: nose Location details: right nostril  Sedation: Patient sedated: no Patient restrained: no Localization method: visualized Removal mechanism: forceps Complexity: simple 1 objects recovered. Objects recovered: 1 Post-procedure assessment: foreign body removed Patient tolerance: Patient tolerated the procedure well with no immediate complications   (including critical care time)  Medications Ordered in ED Medications - No data to  display   Initial Impression / Assessment and Plan / ED Course  I have reviewed the triage vital signs and the nursing notes.  Pertinent labs & imaging results that were available during my care of the patient were reviewed by me and considered in my medical decision making (see chart for details).      21 year old female with small nasal piercing embedded in her right nose. This was removed. There is mild erythema and tenderness but I suspect this is from irritation and not infection. Abx deferred. Return precautions were discussed.  Final Clinical Impressions(s) / ED Diagnoses   Final diagnoses:  Foreign body in nose, initial encounter    New Prescriptions Discharge Medication List as of 05/29/2017  1:03 PM       Raeford Razor, MD 06/16/17 1227

## 2017-05-29 NOTE — ED Triage Notes (Signed)
Pt got her nose pierced 2 days ago and feels it is infected on the right and the left ring is stuck in nose.

## 2017-06-10 ENCOUNTER — Encounter (HOSPITAL_COMMUNITY): Payer: Self-pay | Admitting: *Deleted

## 2017-06-10 ENCOUNTER — Emergency Department (HOSPITAL_COMMUNITY)
Admission: EM | Admit: 2017-06-10 | Discharge: 2017-06-10 | Disposition: A | Discharge status: Home / Self Care | Payer: Medicaid Other | Attending: Emergency Medicine | Admitting: Emergency Medicine

## 2017-06-10 DIAGNOSIS — R05 Cough: Secondary | ICD-10-CM | POA: Diagnosis present

## 2017-06-10 DIAGNOSIS — B9789 Other viral agents as the cause of diseases classified elsewhere: Secondary | ICD-10-CM | POA: Insufficient documentation

## 2017-06-10 DIAGNOSIS — J069 Acute upper respiratory infection, unspecified: Secondary | ICD-10-CM | POA: Diagnosis not present

## 2017-06-10 NOTE — Discharge Instructions (Signed)
Your vital signs and exam are all reassuring this evening. Your symptoms are consistent with a viral upper respiratory illness. Expect symptoms to last about 7 days. May take honey 3 times daily as needed for cough and congestion. Would take ibuprofen 600 mg every 6-8 hours as needed for any throat discomfort. Follow-up with your regular Dr. In 3-4 days if symptoms persist or worsen Return sooner for new wheezing, shortness of breath, worsening condition or new concerns.

## 2017-06-10 NOTE — ED Provider Notes (Signed)
[facility] Provider Note   CSN: [id number] Arrival date & time: 06/10/17  1831     History   Chief Complaint Chief Complaint  Patient presents with  . Cough  . Nasal Congestion    HPI Pamela Jefferson is a 21 y.o. female.  21 year old female with no chronic medical conditions presents for evaluation of cough nasal congestion and throat discomfort. She is here with her 36-year-old son in both her being evaluated today. She reports she initially developed cough and nasal congestion 2 days ago. No associated fever. No wheezing. No vomiting or diarrhea. No abdominal pain or rashes. Her son has had symptoms for 3 days as well.   The history is provided by the patient.    Past Medical History:  Diagnosis Date  . Chlamydia   . Gonorrhea     Patient Active Problem List   Diagnosis Date Noted  . Depression   . Suicidal ideation   . Suicidal ideations   . 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 MDD (major depressive disorder), single episode, severe (HCC) 01/20/2016    Past Surgical History:  Procedure Laterality Date  . FRACTURE SURGERY     left arm    OB History    Gravida Para Term Preterm AB Living   SAB TAB Ectopic Multiple Live Births         0 1       Home Medications    Prior to Admission medications   Medication Sig Start Date End Date Taking? Authorizing Provider  ferrous sulfate 325 (65 FE) MG tablet Take 1 tablet (325 mg total) by mouth daily with breakfast. 09/09/16   Judeth Horn, NP  ibuprofen (ADVIL,MOTRIN) 600 MG tablet Take 1 tablet (600 mg total) by mouth every 6 (six) hours as needed. 09/07/16   Rasch, Victorino Dike I, NP  oxyCODONE-acetaminophen (PERCOCET/ROXICET) 5-325 MG tablet Take 1 tablet by mouth every 4 (four) hours as needed. 09/09/16   Judeth Horn, NP    Family History History reviewed. No pertinent family history.  Social History Social History  Substance Use Topics  .  Smoking status: Never Smoker  . Smokeless tobacco: Never Used  . Alcohol use No     Allergies   Shellfish allergy   Review of Systems Review of Systems  All systems reviewed and were reviewed and were negative except as stated in the HPI  Physical Exam Updated Vital Signs BP 131/83 (BP Location: Right Arm)   Pulse 82   Temp 98.9 F (37.2 C) (Oral)   Resp 18   LMP 06/19/2016   SpO2 99%   Physical Exam  Constitutional: She is oriented to person, place, and time. She appears well-developed and well-nourished. No distress.  HENT:  Head: Normocephalic and atraumatic.  Mouth/Throat: No oropharyngeal exudate.  TMs normal bilaterally  Eyes: Pupils are equal, round, and reactive to light. Conjunctivae and EOM are normal.  Neck: Normal range of motion. Neck supple.  Cardiovascular: Normal rate, regular rhythm and normal heart sounds.  Exam reveals no gallop and no friction rub.   No murmur heard. Pulmonary/Chest: Effort normal. No respiratory distress. She has no wheezes. She has no rales.  Abdominal: Soft. Bowel sounds are normal. There is no tenderness. There is no rebound and no guarding.  Musculoskeletal: Normal range of motion. She exhibits no tenderness.  Neurological: She is alert and oriented to person, place, and time. No cranial nerve deficit.  Normal strength 5/5 in upper and lower extremities, normal coordination  Skin: Skin is warm and dry. No rash noted.  Psychiatric: She has a normal mood and affect.  Nursing note and vitals reviewed.    ED Treatments / Results  Labs (all labs ordered are listed, but only abnormal results are displayed) Labs Reviewed - No data to display  EKG  EKG Interpretation None       Radiology No results found.  Procedures Procedures (including critical care time)  Medications Ordered in ED Medications - No data to display   Initial Impression / Assessment and Plan / ED Course  I have reviewed the triage vital signs and  the nursing notes.  Pertinent labs & imaging results that were available during my care of the patient were reviewed by me and considered in my medical decision making (see chart for details).    21 year old female with no chronic medical conditions here with 3 days of cough nasal drainage and throat discomfort. 80-year-old sibling here with the same. No fevers vomiting or diarrhea.  On exam she is afebrile with normal vitals and very well-appearing. TMs clear, throat benign, lungs clear with normal work of breathing, no wheezes or crackles. Oxygen saturations 99% on room air. Abdomen benign.  Presentation consistent with mild viral URI. Recommend supportive care measures with ibuprofen and honey as needed for cough. PCP follow-up in 4-5 days if symptoms persist or worsen. Return precautions as outlined the discharge instructions.  Final Clinical Impressions(s) / ED Diagnoses   Final diagnoses:  Viral URI with cough    New Prescriptions New Prescriptions   No medications on file     Ree Shay, MD 06/10/17 [phone]

## 2017-06-10 NOTE — ED Triage Notes (Signed)
Pt comes in with c/o cough and nasal congestion x 3 days.  Pt has not had any fever.  No medications PTA.  No distress noted.

## 2017-09-19 NOTE — L&D Delivery Note (Signed)
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 I was called to the room when she progressed +2 station in the second stage of labor.  She pushed for 2/min.  She delivered a viable infant, cephalic OA position and restituted to the OA position over an intact perineum.  A nuchal cord   was not identified. The baby was placed on maternal abdomen while initial step of NRP were perfmored (Dry, Stimulated, and warmed). Hat placed on baby for thermoregulation. Delayed cord clamping was performed for 2 minutes.  Cord double clamped and cut.  Cord cut by mother. Apgar scores were 9 and 9. The placenta delivered spontaneously, shultz, with a 3 vessel cord.  Inspection revealed right labia abrasion and clitoral abrasion, not repaired, hemostatic. Marland Kitchen. An examination of the vaginal vault and cervix was free from lacerations. The uterus was firm, bleeding stable.   Placenta was sent to pathology due to cord varix and umbilical artery blood gas were not sent.  There were no complications during the procedure.  Mom and baby skin to skin following delivery. Left in stable condition.  Maternal Info: Anesthesia:Epidural Episiotomy: No Lacerations:  right labia abrasion and clitoral abrasion , not repaired, hemostatic.  Suture Repair: No Est. Blood Loss (mL):  125  Newborn Info: Baby Sex: female Circumcision: ??? Babies Name: Ju'hun APGAR (1 MIN):   APGAR (5 MINS):   APGAR (10 MINS):     Mom to postpartum.  Baby to Couplet care / Skin to Skin.   Dale DurhamMONTANA, Imogen Maddalena, CNM, NP-C 04/09/18 8:34 PM

## 2017-09-22 ENCOUNTER — Encounter (HOSPITAL_COMMUNITY): Payer: Self-pay | Admitting: Emergency Medicine

## 2017-09-22 ENCOUNTER — Ambulatory Visit (HOSPITAL_COMMUNITY)
Admission: EM | Admit: 2017-09-22 | Discharge: 2017-09-22 | Disposition: A | Discharge status: Home / Self Care | Payer: Medicaid Other | Attending: Emergency Medicine | Admitting: Emergency Medicine

## 2017-09-22 DIAGNOSIS — F322 Major depressive disorder, single episode, severe without psychotic features: Secondary | ICD-10-CM | POA: Insufficient documentation

## 2017-09-22 DIAGNOSIS — R110 Nausea: Secondary | ICD-10-CM | POA: Diagnosis not present

## 2017-09-22 DIAGNOSIS — O2341 Unspecified infection of urinary tract in pregnancy, first trimester: Secondary | ICD-10-CM | POA: Insufficient documentation

## 2017-09-22 DIAGNOSIS — Z79899 Other long term (current) drug therapy: Secondary | ICD-10-CM | POA: Insufficient documentation

## 2017-09-22 DIAGNOSIS — R1030 Lower abdominal pain, unspecified: Secondary | ICD-10-CM | POA: Diagnosis not present

## 2017-09-22 DIAGNOSIS — R45851 Suicidal ideations: Secondary | ICD-10-CM | POA: Insufficient documentation

## 2017-09-22 DIAGNOSIS — R109 Unspecified abdominal pain: Secondary | ICD-10-CM | POA: Diagnosis present

## 2017-09-22 DIAGNOSIS — O26891 Other specified pregnancy related conditions, first trimester: Secondary | ICD-10-CM | POA: Insufficient documentation

## 2017-09-22 DIAGNOSIS — N390 Urinary tract infection, site not specified: Secondary | ICD-10-CM

## 2017-09-22 DIAGNOSIS — Z3201 Encounter for pregnancy test, result positive: Secondary | ICD-10-CM | POA: Diagnosis not present

## 2017-09-22 DIAGNOSIS — Z3A01 Less than 8 weeks gestation of pregnancy: Secondary | ICD-10-CM

## 2017-09-22 LAB — POCT URINALYSIS DIP (DEVICE)
Bilirubin Urine: NEGATIVE
GLUCOSE, UA: NEGATIVE mg/dL
Hgb urine dipstick: NEGATIVE
KETONES UR: NEGATIVE mg/dL
Nitrite: NEGATIVE
Protein, ur: NEGATIVE mg/dL
Specific Gravity, Urine: 1.025 (ref 1.005–1.030)
UROBILINOGEN UA: 0.2 mg/dL (ref 0.0–1.0)
pH: 7 (ref 5.0–8.0)

## 2017-09-22 LAB — POCT PREGNANCY, URINE: PREG TEST UR: POSITIVE — AB

## 2017-09-22 MED ORDER — CEPHALEXIN 500 MG PO CAPS: 500.0000 mg | ORAL_CAPSULE | Freq: Four times a day (QID) | ORAL | 0 refills | Status: DC

## 2017-09-22 MED ORDER — ONDANSETRON HCL 4 MG PO TABS: 4.0000 mg | ORAL_TABLET | Freq: Four times a day (QID) | ORAL | 0 refills | Status: DC

## 2017-09-22 NOTE — Discharge Instructions (Signed)
You requested to find an obgyn  Your urine showed an infection and positive pregnancy  Will give an antibiotic and nausea medication  We will send your urine for a culture and if needed you will be contacted for positive results

## 2017-09-22 NOTE — ED Triage Notes (Signed)
PT reports abdominal pain and nausea for 2 weeks. PT reports breasts are tenders and menstrual is late.

## 2017-09-22 NOTE — ED Notes (Signed)
PT requests STD check as well

## 2017-09-22 NOTE — ED Provider Notes (Signed)
[facility]    CSN: [id number] Arrival date & time: 09/22/17  1353     History   Chief Complaint Chief Complaint  Patient presents with  . Abdominal Pain    HPI Pamela Jefferson is a 22 y.o. female.   Pt states that she has n/v lower abd pain and no menstrual cycle for 2 months now. States that she does not use any birth control and is unsure if she is pregnant. Last preg she had a miscarriage In 2017. Denies any vaginal discharge. Has not taken anything for this.       Past Medical History:  Diagnosis Date  . Chlamydia   . Gonorrhea     Patient Active Problem List   Diagnosis Date Noted  . Depression   . Suicidal ideation   . Suicidal ideations   . 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 MDD (major depressive disorder), single episode, severe (HCC) 01/20/2016    Past Surgical History:  Procedure Laterality Date  . FRACTURE SURGERY     left arm    OB History    Gravida Para Term Preterm AB Living   2 1 1     1    SAB TAB Ectopic Multiple Live Births         0 1       Home Medications    Prior to Admission medications   Medication Sig Start Date End Date Taking? Authorizing Provider  cephALEXin (KEFLEX) 500 MG capsule Take 1 capsule (500 mg total) by mouth 4 (four) times daily. 09/22/17   Coralyn MarkMitchell, Shonta Phillis L, NP  ferrous sulfate 325 (65 FE) MG tablet Take 1 tablet (325 mg total) by mouth daily with breakfast. 09/09/16   Judeth HornLawrence, Erin, NP  ibuprofen (ADVIL,MOTRIN) 600 MG tablet Take 1 tablet (600 mg total) by mouth every 6 (six) hours as needed. 09/07/16   Rasch, Victorino DikeJennifer I, NP  ondansetron (ZOFRAN) 4 MG tablet Take 1 tablet (4 mg total) by mouth every 6 (six) hours. 09/22/17   Coralyn MarkMitchell, Joachim Carton L, NP  oxyCODONE-acetaminophen (PERCOCET/ROXICET) 5-325 MG tablet Take 1 tablet by mouth every 4 (four) hours as needed. 09/09/16   Judeth HornLawrence, Erin, NP    Family History No family history on file.  Social History Social  History   Tobacco Use  . Smoking status: Never Smoker  . Smokeless tobacco: Never Used  Substance Use Topics  . Alcohol use: No  . Drug use: No     Allergies   Shellfish allergy   Review of Systems Review of Systems  Constitutional: Positive for appetite change.  Respiratory: Negative.   Cardiovascular: Negative.   Gastrointestinal: Positive for abdominal pain, nausea and vomiting.  Genitourinary: Positive for menstrual problem and pelvic pain.  Skin: Negative.   Neurological: Negative.      Physical Exam Triage Vital Signs ED Triage Vitals  Enc Vitals Group     BP 09/22/17 1502 118/68     Pulse Rate 09/22/17 1502 83     Resp 09/22/17 1502 16     Temp 09/22/17 1502 98.5 F (36.9 C)     Temp Source 09/22/17 1502 Oral     SpO2 09/22/17 1502 100 %     Weight 09/22/17 1502 148 lb (67.1 kg)     Height --      Head Circumference --      Peak Flow --      Pain Score 09/22/17 1503 6     Pain Loc --  Pain Edu? --      Excl. in GC? --    No data found.  Updated Vital Signs BP 118/68   Pulse 83   Temp 98.5 F (36.9 C) (Oral)   Resp 16   Wt 148 lb (67.1 kg)   LMP 07/17/2017   SpO2 100%   BMI 22.50 kg/m   Visual Acuity Right Eye Distance:   Left Eye Distance:   Bilateral Distance:    Right Eye Near:   Left Eye Near:    Bilateral Near:     Physical Exam  Constitutional: She appears well-developed.  Pulmonary/Chest: Effort normal.  Abdominal: Soft. Bowel sounds are normal. There is tenderness in the left upper quadrant and left lower quadrant.  Genitourinary: Vagina normal. Uterus is tender.  Genitourinary Comments: Neg cv tenderness   Skin: Skin is warm.     UC Treatments / Results  Labs (all labs ordered are listed, but only abnormal results are displayed) Labs Reviewed  POCT URINALYSIS DIP (DEVICE) - Abnormal; Notable for the following components:      Result Value   Leukocytes, UA TRACE (*)    All other components within normal limits    POCT PREGNANCY, URINE - Abnormal; Notable for the following components:   Preg Test, Ur POSITIVE (*)    All other components within normal limits  URINE CULTURE    EKG  EKG Interpretation None       Radiology No results found.  Procedures Procedures (including critical care time)  Medications Ordered in UC Medications - No data to display   Initial Impression / Assessment and Plan / UC Course  I have reviewed the triage vital signs and the nursing notes.  Pertinent labs & imaging results that were available during my care of the patient were reviewed by me and considered in my medical decision making (see chart for details).  You requested to find an obgyn  Your urine showed an infection and positive pregnancy  Will give an antibiotic and nausea medication  We will send your urine for a culture and if needed you will be contacted for positive results  Final Clinical Impressions(s) / UC Diagnoses   Final diagnoses:  Lower abdominal pain  Nausea  Less than [redacted] weeks gestation of pregnancy  Lower urinary tract infectious disease    ED Discharge Orders        Ordered    cephALEXin (KEFLEX) 500 MG capsule  4 times daily     09/22/17 1533    ondansetron (ZOFRAN) 4 MG tablet  Every 6 hours     09/22/17 1537       Controlled Substance Prescriptions [place] Controlled Substance Registry consulted? Not Applicable   Coralyn Mark, NP 09/22/17 1540

## 2017-09-23 LAB — URINE CULTURE: Culture: NO GROWTH

## 2017-10-03 ENCOUNTER — Encounter: Payer: Self-pay | Admitting: Obstetrics

## 2017-10-06 ENCOUNTER — Encounter: Payer: Self-pay | Admitting: Obstetrics

## 2017-10-06 LAB — OB RESULTS CONSOLE HIV ANTIBODY (ROUTINE TESTING): HIV: NONREACTIVE

## 2017-10-06 LAB — OB RESULTS CONSOLE ABO/RH: RH TYPE: POSITIVE

## 2017-10-06 LAB — OB RESULTS CONSOLE HEPATITIS B SURFACE ANTIGEN: HEP B S AG: NEGATIVE

## 2017-10-06 LAB — OB RESULTS CONSOLE GC/CHLAMYDIA
Chlamydia: NEGATIVE
Gonorrhea: NEGATIVE

## 2017-10-06 LAB — OB RESULTS CONSOLE ANTIBODY SCREEN: Antibody Screen: NEGATIVE

## 2017-10-06 LAB — OB RESULTS CONSOLE RUBELLA ANTIBODY, IGM: RUBELLA: IMMUNE

## 2017-10-06 LAB — OB RESULTS CONSOLE RPR: RPR: NONREACTIVE

## 2017-10-10 ENCOUNTER — Inpatient Hospital Stay (HOSPITAL_COMMUNITY)
Admission: AD | Admit: 2017-10-10 | Discharge: 2017-10-11 | Disposition: A | Discharge status: Home / Self Care | Payer: Medicaid Other | Source: Ambulatory Visit | Attending: Obstetrics and Gynecology | Admitting: Obstetrics and Gynecology

## 2017-10-10 ENCOUNTER — Encounter (HOSPITAL_COMMUNITY): Payer: Self-pay

## 2017-10-10 DIAGNOSIS — O26891 Other specified pregnancy related conditions, first trimester: Secondary | ICD-10-CM

## 2017-10-10 DIAGNOSIS — O26892 Other specified pregnancy related conditions, second trimester: Secondary | ICD-10-CM | POA: Insufficient documentation

## 2017-10-10 DIAGNOSIS — R109 Unspecified abdominal pain: Secondary | ICD-10-CM | POA: Insufficient documentation

## 2017-10-10 DIAGNOSIS — Z3A14 14 weeks gestation of pregnancy: Secondary | ICD-10-CM | POA: Insufficient documentation

## 2017-10-10 LAB — URINALYSIS, ROUTINE W REFLEX MICROSCOPIC
BILIRUBIN URINE: NEGATIVE
GLUCOSE, UA: NEGATIVE mg/dL
HGB URINE DIPSTICK: NEGATIVE
Ketones, ur: NEGATIVE mg/dL
NITRITE: NEGATIVE
PH: 6 (ref 5.0–8.0)
Protein, ur: NEGATIVE mg/dL
SPECIFIC GRAVITY, URINE: 1.02 (ref 1.005–1.030)

## 2017-10-10 NOTE — MAU Note (Signed)
Pt here with c/o abdominal pain that started about 30 minutes ago. Denies any bleeding. Was told at urgent care that she was [redacted] weeks pregnant about 2 weeks ago.

## 2017-10-11 NOTE — MAU Provider Note (Signed)
Chief Complaint: Abdominal Pain   None    SUBJECTIVE HPI: Pamela Jefferson is a 22 y.o. G3P1001 at 6722w2d who presents to [facility]aternity Admissions reporting abd pain for last 30 minutes.  Denies bleeding or leaking fluid.  Pain better now.  Complains of constipation.  Had UA and cultures in office on 10/06/17 and were negative.  Location: low abd Quality: cramping Severity: 3/10 on pain scale Duration: 30 minutes  Modifying factors: Has not tried any otcs   Past Medical History:  Diagnosis Date  . Chlamydia   . Gonorrhea    OB History  Gravida Para Term Preterm AB Living  3 1 1     1   SAB TAB Ectopic Multiple Live Births        0 1    # Outcome Date GA Lbr Len/2nd Weight Sex Delivery Anes PTL Lv  3 Current           2 Term 10/27/14 2482w5d 12:30 / 01:07 3.909 kg (8 lb 9.9 oz) M Vag-Spont EPI  LIV  1 Gravida              Past Surgical History:  Procedure Laterality Date  . FRACTURE SURGERY     left arm   Social History   Socioeconomic History  . Marital status: Single    Spouse name: Not on file  . Number of children: Not on file  . Years of education: Not on file  . Highest education level: Not on file  Social Needs  . Financial resource strain: Not on file  . Food insecurity - worry: Not on file  . Food insecurity - inability: Not on file  . Transportation needs - medical: Not on file  . Transportation needs - non-medical: Not on file  Occupational History  . Not on file  Tobacco Use  . Smoking status: Never Smoker  . Smokeless tobacco: Never Used  Substance and Sexual Activity  . Alcohol use: No  . Drug use: No  . Sexual activity: Yes    Birth control/protection: None  Other Topics Concern  . Not on file  Social History Narrative  . Not on file   No family history on file. No current facility-administered medications on file prior to encounter.    Current Outpatient Medications on File Prior to Encounter  Medication Sig Dispense Refill  . cephALEXin  (KEFLEX) 500 MG capsule Take 1 capsule (500 mg total) by mouth 4 (four) times daily. 20 capsule 0  . ferrous sulfate 325 (65 FE) MG tablet Take 1 tablet (325 mg total) by mouth daily with breakfast. 30 tablet 0  . ondansetron (ZOFRAN) 4 MG tablet Take 1 tablet (4 mg total) by mouth every 6 (six) hours. 12 tablet 0  . ibuprofen (ADVIL,MOTRIN) 600 MG tablet Take 1 tablet (600 mg total) by mouth every 6 (six) hours as needed. 30 tablet 0  . oxyCODONE-acetaminophen (PERCOCET/ROXICET) 5-325 MG tablet Take 1 tablet by mouth every 4 (four) hours as needed. 10 tablet 0   Allergies  Allergen Reactions  . Shellfish Allergy Anaphylaxis    I have reviewed patient's Past Medical Hx, Surgical Hx, Family Hx, Social Hx, medications and allergies.   Review of Systems  Constitutional: Negative.   HENT: Negative.   Eyes: Negative.   Respiratory: Negative.   Cardiovascular: Negative.   Gastrointestinal: Positive for abdominal pain.  Endocrine: Negative.   Genitourinary: Negative.   Musculoskeletal: Negative.   Allergic/Immunologic: Negative.   Neurological: Negative.   Hematological: Negative.  Psychiatric/Behavioral: Negative.     OBJECTIVE Patient Vitals for the past 24 hrs:  BP Temp Temp src Pulse Resp SpO2 Height Weight  10/10/17 2309 121/67 98.9 F (37.2 C) Oral 83 18 100 % 5\' 8"  (1.727 m) 69.9 kg (154 lb)  FHT 150 Constitutional: Well-developed, well-nourished female in no acute distress.  Cardiovascular: normal rate Respiratory: normal rate and effort.  GI: Abd soft, non-tender, gravid appropriate for gestational age. Pos BS x 4 MS: Extremities nontender, no edema, normal ROM Neurologic: Alert and oriented x 4.  GU: Neg CVAT.   LAB RESULTS Results for orders placed or performed during the hospital encounter of 10/10/17 (from the past 24 hour(s))  Urinalysis, Routine w reflex microscopic     Status: Abnormal   Collection Time: 10/10/17 11:12 PM  Result Value Ref Range   Color,  Urine YELLOW YELLOW   APPearance HAZY (A) CLEAR   Specific Gravity, Urine 1.020 1.005 - 1.030   pH 6.0 5.0 - 8.0   Glucose, UA NEGATIVE NEGATIVE mg/dL   Hgb urine dipstick NEGATIVE NEGATIVE   Bilirubin Urine NEGATIVE NEGATIVE   Ketones, ur NEGATIVE NEGATIVE mg/dL   Protein, ur NEGATIVE NEGATIVE mg/dL   Nitrite NEGATIVE NEGATIVE   Leukocytes, UA TRACE (A) NEGATIVE   RBC / HPF 0-5 0 - 5 RBC/hpf   WBC, UA 0-5 0 - 5 WBC/hpf   Bacteria, UA RARE (A) NONE SEEN   Squamous Epithelial / LPF 0-5 (A) NONE SEEN   Mucus PRESENT     IMAGING No results found.  MAU COURSE Orders Placed This Encounter  Procedures  . Urinalysis, Routine w reflex microscopic   No orders of the defined types were placed in this encounter.   MDM PE and UA negative.  Pain better ASSESSMENT Abd pain in pregnancy  PLAN Discharge home in stable condition.  Increase fluids and fiber in fiber.  Discussed danger signs and symptoms of bleedingwith cramping.  Discussed otc for comfort and common discomforts of pregnancy.      Kenney Houseman, CNM 10/11/2017  12:40 AM

## 2017-10-11 NOTE — Discharge Instructions (Signed)

## 2018-01-22 ENCOUNTER — Inpatient Hospital Stay (HOSPITAL_COMMUNITY)
Admission: AD | Admit: 2018-01-22 | Discharge: 2018-01-23 | Disposition: A | Discharge status: Home / Self Care | Payer: Medicaid Other | Source: Ambulatory Visit | Attending: Obstetrics and Gynecology | Admitting: Obstetrics and Gynecology

## 2018-01-22 DIAGNOSIS — Z9889 Other specified postprocedural states: Secondary | ICD-10-CM | POA: Insufficient documentation

## 2018-01-22 DIAGNOSIS — B9689 Other specified bacterial agents as the cause of diseases classified elsewhere: Secondary | ICD-10-CM

## 2018-01-22 DIAGNOSIS — Z8619 Personal history of other infectious and parasitic diseases: Secondary | ICD-10-CM | POA: Insufficient documentation

## 2018-01-22 DIAGNOSIS — O99012 Anemia complicating pregnancy, second trimester: Secondary | ICD-10-CM

## 2018-01-22 DIAGNOSIS — N760 Acute vaginitis: Secondary | ICD-10-CM

## 2018-01-22 DIAGNOSIS — Z91013 Allergy to seafood: Secondary | ICD-10-CM | POA: Insufficient documentation

## 2018-01-22 DIAGNOSIS — O23592 Infection of other part of genital tract in pregnancy, second trimester: Secondary | ICD-10-CM | POA: Insufficient documentation

## 2018-01-22 DIAGNOSIS — Z3A26 26 weeks gestation of pregnancy: Secondary | ICD-10-CM

## 2018-01-23 ENCOUNTER — Encounter (HOSPITAL_COMMUNITY): Payer: Self-pay

## 2018-01-23 DIAGNOSIS — O99012 Anemia complicating pregnancy, second trimester: Secondary | ICD-10-CM | POA: Diagnosis not present

## 2018-01-23 DIAGNOSIS — N760 Acute vaginitis: Secondary | ICD-10-CM

## 2018-01-23 DIAGNOSIS — B9689 Other specified bacterial agents as the cause of diseases classified elsewhere: Secondary | ICD-10-CM | POA: Diagnosis not present

## 2018-01-23 DIAGNOSIS — Z9889 Other specified postprocedural states: Secondary | ICD-10-CM | POA: Diagnosis not present

## 2018-01-23 DIAGNOSIS — O9989 Other specified diseases and conditions complicating pregnancy, childbirth and the puerperium: Secondary | ICD-10-CM

## 2018-01-23 DIAGNOSIS — Z8619 Personal history of other infectious and parasitic diseases: Secondary | ICD-10-CM | POA: Diagnosis not present

## 2018-01-23 DIAGNOSIS — R109 Unspecified abdominal pain: Secondary | ICD-10-CM | POA: Diagnosis present

## 2018-01-23 DIAGNOSIS — Z91013 Allergy to seafood: Secondary | ICD-10-CM | POA: Diagnosis not present

## 2018-01-23 DIAGNOSIS — Z3A26 26 weeks gestation of pregnancy: Secondary | ICD-10-CM | POA: Diagnosis not present

## 2018-01-23 DIAGNOSIS — O23592 Infection of other part of genital tract in pregnancy, second trimester: Secondary | ICD-10-CM | POA: Diagnosis not present

## 2018-01-23 LAB — COMPREHENSIVE METABOLIC PANEL
ALBUMIN: 3.2 g/dL — AB (ref 3.5–5.0)
ALT: 11 U/L — ABNORMAL LOW (ref 14–54)
AST: 13 U/L — AB (ref 15–41)
Alkaline Phosphatase: 73 U/L (ref 38–126)
Anion gap: 9 (ref 5–15)
BILIRUBIN TOTAL: 0.2 mg/dL — AB (ref 0.3–1.2)
BUN: 9 mg/dL (ref 6–20)
CHLORIDE: 106 mmol/L (ref 101–111)
CO2: 21 mmol/L — AB (ref 22–32)
Calcium: 8.4 mg/dL — ABNORMAL LOW (ref 8.9–10.3)
Creatinine, Ser: 0.48 mg/dL (ref 0.44–1.00)
GFR calc Af Amer: >60 mL/min (ref >60)
GFR calc non Af Amer: >60 mL/min (ref >60)
GLUCOSE: 93 mg/dL (ref 65–99)
POTASSIUM: 3.4 mmol/L — AB (ref 3.5–5.1)
SODIUM: 136 mmol/L (ref 135–145)
Total Protein: 6.9 g/dL (ref 6.5–8.1)

## 2018-01-23 LAB — URINALYSIS, ROUTINE W REFLEX MICROSCOPIC
Bilirubin Urine: NEGATIVE
GLUCOSE, UA: NEGATIVE mg/dL
Hgb urine dipstick: NEGATIVE
KETONES UR: NEGATIVE mg/dL
Nitrite: NEGATIVE
PH: 7 (ref 5.0–8.0)
Protein, ur: NEGATIVE mg/dL
Specific Gravity, Urine: 1.02 (ref 1.005–1.030)

## 2018-01-23 LAB — AMNISURE RUPTURE OF MEMBRANE (ROM) NOT AT ARMC: Amnisure ROM: NEGATIVE

## 2018-01-23 LAB — WET PREP, GENITAL
SPERM: NONE SEEN
TRICH WET PREP: NONE SEEN
Yeast Wet Prep HPF POC: NONE SEEN

## 2018-01-23 LAB — CBC
HCT: 27.4 % — ABNORMAL LOW (ref 36.0–46.0)
Hemoglobin: 9.2 g/dL — ABNORMAL LOW (ref 12.0–15.0)
MCH: 30.9 pg (ref 26.0–34.0)
MCHC: 33.6 g/dL (ref 30.0–36.0)
MCV: 91.9 fL (ref 78.0–100.0)
PLATELETS: 305 10*3/uL (ref 150–400)
RBC: 2.98 MIL/uL — ABNORMAL LOW (ref 3.87–5.11)
RDW: 13.3 % (ref 11.5–15.5)
WBC: 10 10*3/uL (ref 4.0–10.5)

## 2018-01-23 MED ORDER — DOCUSATE SODIUM 250 MG PO CAPS: 250.0000 mg | ORAL_CAPSULE | Freq: Every day | ORAL | 0 refills | Status: AC

## 2018-01-23 MED ORDER — FERROUS SULFATE 325 (65 FE) MG PO TABS: 325.0000 mg | ORAL_TABLET | Freq: Every day | ORAL | 0 refills | Status: DC

## 2018-01-23 MED ORDER — METRONIDAZOLE 500 MG PO TABS: 500.0000 mg | ORAL_TABLET | Freq: Two times a day (BID) | ORAL | 0 refills | Status: DC

## 2018-01-23 NOTE — MAU Note (Signed)
Pt here with c/o abdominal pain, body pains, nausea/vomitng for past 3 days; Had a gush of fluid about a week ago, has continued leaking since then.

## 2018-01-23 NOTE — Discharge Instructions (Signed)
Bacterial Vaginosis Bacterial vaginosis is a vaginal infection that occurs when the normal balance of bacteria in the vagina is disrupted. It results from an overgrowth of certain bacteria. This is the most common vaginal infection among women ages 15-44. Because bacterial vaginosis increases your risk for STIs (sexually transmitted infections), getting treated can help reduce your risk for chlamydia, gonorrhea, herpes, and HIV (human immunodeficiency virus). Treatment is also important for preventing complications in pregnant women, because this condition can cause an early (premature) delivery. What are the causes? This condition is caused by an increase in harmful bacteria that are normally present in small amounts in the vagina. However, the reason that the condition develops is not fully understood. What increases the risk? The following factors may make you more likely to develop this condition:  Having a new sexual partner or multiple sexual partners.  Having unprotected sex.  Douching.  Having an intrauterine device (IUD).  Smoking.  Drug and alcohol abuse.  Taking certain antibiotic medicines.  Being pregnant.  You cannot get bacterial vaginosis from toilet seats, bedding, swimming pools, or contact with objects around you. What are the signs or symptoms? Symptoms of this condition include:  Grey or white vaginal discharge. The discharge can also be watery or foamy.  A fish-like odor with discharge, especially after sexual intercourse or during menstruation.  Itching in and around the vagina.  Burning or pain with urination.  Some women with bacterial vaginosis have no signs or symptoms. How is this diagnosed? This condition is diagnosed based on:  Your medical history.  A physical exam of the vagina.  Testing a sample of vaginal fluid under a microscope to look for a large amount of bad bacteria or abnormal cells. Your health care provider may use a cotton swab  or a small wooden spatula to collect the sample.  How is this treated? This condition is treated with antibiotics. These may be given as a pill, a vaginal cream, or a medicine that is put into the vagina (suppository). If the condition comes back after treatment, a second round of antibiotics may be needed. Follow these instructions at home: Medicines  Take over-the-counter and prescription medicines only as told by your health care provider.  Take or use your antibiotic as told by your health care provider. Do not stop taking or using the antibiotic even if you start to feel better. General instructions  If you have a female sexual partner, tell her that you have a vaginal infection. She should see her health care provider and be treated if she has symptoms. If you have a female sexual partner, he does not need treatment.  During treatment: ? Avoid sexual activity until you finish treatment. ? Do not douche. ? Avoid alcohol as directed by your health care provider. ? Avoid breastfeeding as directed by your health care provider.  Drink enough water and fluids to keep your urine clear or pale yellow.  Keep the area around your vagina and rectum clean. ? Wash the area daily with warm water. ? Wipe yourself from front to back after using the toilet.  Keep all follow-up visits as told by your health care provider. This is important. How is this prevented?  Do not douche.  Wash the outside of your vagina with warm water only.  Use protection when having sex. This includes latex condoms and dental dams.  Limit how many sexual partners you have. To help prevent bacterial vaginosis, it is best to have sex with just   one partner (monogamous).  Make sure you and your sexual partner are tested for STIs.  Wear cotton or cotton-lined underwear.  Avoid wearing tight pants and pantyhose, especially during summer.  Limit the amount of alcohol that you drink.  Do not use any products that  contain nicotine or tobacco, such as cigarettes and e-cigarettes. If you need help quitting, ask your health care provider.  Do not use illegal drugs. Where to find more information:  Centers for Disease Control and Prevention: www.cdc.gov/std  American Sexual Health Association (ASHA): www.ashastd.org  [place] Department of Health and Human Services, Office on Women's Health: www.womenshealth.gov/ or https://www.womenshealth.gov/a-z-topics/bacterial-vaginosis Contact a health care provider if:  Your symptoms do not improve, even after treatment.  You have more discharge or pain when urinating.  You have a fever.  You have pain in your abdomen.  You have pain during sex.  You have vaginal bleeding between periods. Summary  Bacterial vaginosis is a vaginal infection that occurs when the normal balance of bacteria in the vagina is disrupted.  Because bacterial vaginosis increases your risk for STIs (sexually transmitted infections), getting treated can help reduce your risk for chlamydia, gonorrhea, herpes, and HIV (human immunodeficiency virus). Treatment is also important for preventing complications in pregnant women, because the condition can cause an early (premature) delivery.  This condition is treated with antibiotic medicines. These may be given as a pill, a vaginal cream, or a medicine that is put into the vagina (suppository). This information is not intended to replace advice given to you by your health care provider. Make sure you discuss any questions you have with your health care provider. Document Released: 09/05/2005 Document Revised: 01/09/2017 Document Reviewed: 05/21/2016 Elsevier Interactive Patient Education  2018 Elsevier Inc.  

## 2018-01-23 NOTE — MAU Provider Note (Signed)
History     CSN: [id number]  Arrival date and time: 01/22/18 2340   First Provider Initiated Contact with Patient 01/23/18 0030     Chief Complaint  Patient presents with  . Abdominal Pain  . Nausea   HPI Pamela Jefferson is a 22 y.o. G3P1011 at [redacted]w[redacted]d who presents with multiple complaints. She states she had a big gush of fluid about a week ago and has continued to leak clear fluid since then. She states she is taking multiple showers a day because of the leaking. She also reports pain in her thighs and sides of her abdomen when she walks. She denies any pain at this time. Denies vaginal bleeding. She also reports nausea and vomiting today. She states she slept all day but when she tried to eat dinner at 0830, she threw up. She has not drank any water because she was afraid that she might throw up. She denies any nausea at this time. She reports good fetal movement.   OB History    Gravida  3   Para  1   Term  1   Preterm      AB  1   Living  1     SAB  1   TAB      Ectopic      Multiple  0   Live Births  1           Past Medical History:  Diagnosis Date  . Chlamydia   . Gonorrhea   . Medical history non-contributory     Past Surgical History:  Procedure Laterality Date  . FRACTURE SURGERY     left arm    No family history on file.  Social History   Tobacco Use  . Smoking status: Never Smoker  . Smokeless tobacco: Never Used  Substance Use Topics  . Alcohol use: No  . Drug use: No    Allergies:  Allergies  Allergen Reactions  . Shellfish Allergy Anaphylaxis    Medications Prior to Admission  Medication Sig Dispense Refill Last Dose  . ondansetron (ZOFRAN) 4 MG tablet Take 1 tablet (4 mg total) by mouth every 6 (six) hours. 12 tablet 0 Past Month at Unknown time    Review of Systems  Constitutional: Negative.  Negative for fatigue and fever.  HENT: Negative.   Respiratory: Negative.  Negative for shortness of breath.   Cardiovascular:  Negative.  Negative for chest pain.  Gastrointestinal: Positive for abdominal pain and nausea. Negative for constipation, diarrhea and vomiting.  Genitourinary: Positive for vaginal discharge. Negative for dysuria and vaginal bleeding.  Neurological: Negative.  Negative for dizziness and headaches.   Physical Exam   Blood pressure 122/67, pulse 90, temperature 98.7 F (37.1 C), temperature source Oral, resp. rate 18, height  (1.702 m), weight 171 lb (77.6 kg), last menstrual period 07/03/2017, SpO2 99 %, unknown if currently breastfeeding.  Physical Exam  Nursing note and vitals reviewed. Constitutional: She is oriented to person, place, and time. She appears well-developed and well-nourished. No distress.  HENT:  Head: Normocephalic.  Eyes: Pupils are equal, round, and reactive to light.  Cardiovascular: Normal rate, regular rhythm and normal heart sounds.  Respiratory: Effort normal and breath sounds normal. No respiratory distress.  GI: Soft. Bowel sounds are normal. She exhibits no distension. There is no tenderness.  Neurological: She is alert and oriented to person, place, and time.  Skin: Skin is warm and dry.  Psychiatric: She has a normal mood  and affect. Her behavior is normal. Judgment and thought content normal.   Pelvic exam: Cervix pink, visually closed, without lesion, scant white creamy discharge, vaginal walls and external genitalia normal Bimanual exam: Cervix 0/long/high, firm, anterior, neg CMT, uterus nontender, nonenlarged, adnexa without tenderness or mass  Fetal Tracing:  Baseline: 135 Variability: moderate Accels: 10x10 Decels: none  Toco: none  Dilation: Closed Effacement (%): Thick Cervical Position: Posterior Exam by:: Ma Hillock CNM   MAU Course  Procedures Results for orders placed or performed during the hospital encounter of 01/22/18 (from the past 24 hour(s))  Urinalysis, Routine w reflex microscopic     Status: Abnormal   Collection  Time: 01/23/18 12:07 AM  Result Value Ref Range   Color, Urine YELLOW YELLOW   APPearance CLOUDY (A) CLEAR   Specific Gravity, Urine 1.020 1.005 - 1.030   pH 7.0 5.0 - 8.0   Glucose, UA NEGATIVE NEGATIVE mg/dL   Hgb urine dipstick NEGATIVE NEGATIVE   Bilirubin Urine NEGATIVE NEGATIVE   Ketones, ur NEGATIVE NEGATIVE mg/dL   Protein, ur NEGATIVE NEGATIVE mg/dL   Nitrite NEGATIVE NEGATIVE   Leukocytes, UA LARGE (A) NEGATIVE   RBC / HPF 0-5 0 - 5 RBC/hpf   WBC, UA 21-50 0 - 5 WBC/hpf   Bacteria, UA RARE (A) NONE SEEN   Squamous Epithelial / LPF 11-20 0 - 5   Mucus PRESENT   Amnisure rupture of membrane (rom)not at [facility]     Status: None   Collection Time: 01/23/18 12:40 AM  Result Value Ref Range   Amnisure ROM NEGATIVE   Wet prep, genital     Status: Abnormal   Collection Time: 01/23/18 12:40 AM  Result Value Ref Range   Yeast Wet Prep HPF POC NONE SEEN NONE SEEN   Trich, Wet Prep NONE SEEN NONE SEEN   Clue Cells Wet Prep HPF POC PRESENT (A) NONE SEEN   WBC, Wet Prep HPF POC MANY (A) NONE SEEN   Sperm NONE SEEN   CBC     Status: Abnormal   Collection Time: 01/23/18 12:45 AM  Result Value Ref Range   WBC 10.0 4.0 - 10.5 K/uL   RBC 2.98 (L) 3.87 - 5.11 MIL/uL   Hemoglobin 9.2 (L) 12.0 - 15.0 g/dL   HCT 13.0 (L) 86.5 - 78.4 %   MCV 91.9 78.0 - 100.0 fL   MCH 30.9 26.0 - 34.0 pg   MCHC 33.6 30.0 - 36.0 g/dL   RDW 69.6 29.5 - 28.4 %   Platelets 305 150 - 400 K/uL  Comprehensive metabolic panel     Status: Abnormal   Collection Time: 01/23/18 12:45 AM  Result Value Ref Range   Sodium 136 135 - 145 mmol/L   Potassium 3.4 (L) 3.5 - 5.1 mmol/L   Chloride 106 101 - 111 mmol/L   CO2 21 (L) 22 - 32 mmol/L   Glucose, Bld 93 65 - 99 mg/dL   BUN 9 6 - 20 mg/dL   Creatinine, Ser 1.32 0.44 - 1.00 mg/dL   Calcium 8.4 (L) 8.9 - 10.3 mg/dL   Total Protein 6.9 6.5 - 8.1 g/dL   Albumin 3.2 (L) 3.5 - 5.0 g/dL   AST 13 (L) 15 - 41 U/L   ALT 11 (L) 14 - 54 U/L   Alkaline Phosphatase 73 38  - 126 U/L   Total Bilirubin 0.2 (L) 0.3 - 1.2 mg/dL   GFR calc non Af Amer >60 >60 mL/min   GFR calc Af  Amer >60 >60 mL/min   Anion gap 9 5 - 15   MDM UA CBC, CMP Amnisure Wet prep and gc/chlamydia  Patient refused to PO hydrate while in MAU stating "I might throw up." Offered patient nausea medication and she refused.  Results reviewed with Dr. Su Hilt- will treat for bacterial vaginosis and start patient on iron supplement  Assessment and Plan   1. Bacterial vaginosis   2. Anemia during pregnancy in second trimester   3. [redacted] weeks gestation of pregnancy    -Discharge home in stable condition -Rx for flagyl, ferrous sulfate and colace sent to patient's pharmacy -Preterm labor precautions discussed -Patient advised to follow-up with [facility] as scheduled for prenatal care -Patient may return to MAU as needed or if her condition were to change or worsen  Rolm Bookbinder CNM 01/23/2018, 12:30 AM

## 2018-01-23 NOTE — MAU Note (Signed)
Spoke with Dr. Su Hilt, requests MAU Provider to see patient.  Cleone Slim, CNM agrees to oversee care of patient.

## 2018-01-24 LAB — CULTURE, OB URINE: Culture: <10000 — AB

## 2018-01-24 LAB — GC/CHLAMYDIA PROBE AMP (~~LOC~~) NOT AT ARMC
Chlamydia: NEGATIVE
Neisseria Gonorrhea: NEGATIVE

## 2018-03-05 ENCOUNTER — Other Ambulatory Visit: Payer: Self-pay

## 2018-03-05 ENCOUNTER — Encounter (HOSPITAL_COMMUNITY): Payer: Self-pay

## 2018-03-05 ENCOUNTER — Inpatient Hospital Stay (HOSPITAL_COMMUNITY)
Admission: AD | Admit: 2018-03-05 | Discharge: 2018-03-06 | Disposition: A | Discharge status: Home / Self Care | Payer: Medicaid Other | Source: Ambulatory Visit | Attending: Obstetrics and Gynecology | Admitting: Obstetrics and Gynecology

## 2018-03-05 ENCOUNTER — Inpatient Hospital Stay (HOSPITAL_COMMUNITY): Payer: Medicaid Other

## 2018-03-05 DIAGNOSIS — O4702 False labor before 37 completed weeks of gestation, second trimester: Secondary | ICD-10-CM

## 2018-03-05 DIAGNOSIS — O4703 False labor before 37 completed weeks of gestation, third trimester: Secondary | ICD-10-CM | POA: Diagnosis not present

## 2018-03-05 DIAGNOSIS — Z3A32 32 weeks gestation of pregnancy: Secondary | ICD-10-CM

## 2018-03-05 LAB — FETAL FIBRONECTIN: FETAL FIBRONECTIN: POSITIVE — AB

## 2018-03-05 LAB — URINALYSIS, ROUTINE W REFLEX MICROSCOPIC
Bilirubin Urine: NEGATIVE
Glucose, UA: NEGATIVE mg/dL
Hgb urine dipstick: NEGATIVE
Ketones, ur: NEGATIVE mg/dL
LEUKOCYTES UA: NEGATIVE
NITRITE: NEGATIVE
PH: 8 (ref 5.0–8.0)
Protein, ur: NEGATIVE mg/dL
SPECIFIC GRAVITY, URINE: 1.006 (ref 1.005–1.030)

## 2018-03-05 LAB — AMNISURE RUPTURE OF MEMBRANE (ROM) NOT AT ARMC: AMNISURE: NEGATIVE

## 2018-03-05 LAB — WET PREP, GENITAL
Clue Cells Wet Prep HPF POC: NONE SEEN
Trich, Wet Prep: NONE SEEN
Yeast Wet Prep HPF POC: NONE SEEN

## 2018-03-05 LAB — GROUP B STREP BY PCR: GROUP B STREP BY PCR: NEGATIVE

## 2018-03-05 MED ORDER — LACTATED RINGERS IV SOLN: INTRAVENOUS | Status: DC

## 2018-03-05 MED ORDER — BETAMETHASONE SOD PHOS & ACET 6 (3-3) MG/ML IJ SUSP
12.0000 mg | INTRAMUSCULAR | Status: DC
  Administered 2018-03-05: 12 mg via INTRAMUSCULAR
  Filled 2018-03-05: qty 2

## 2018-03-05 MED ORDER — PRENATAL MULTIVITAMIN CH: 1.0000 | ORAL_TABLET | Freq: Every day | ORAL | Status: DC

## 2018-03-05 MED ORDER — LACTATED RINGERS IV BOLUS
500.0000 mL | Freq: Once | INTRAVENOUS | Status: AC
  Administered 2018-03-05: 500 mL via INTRAVENOUS

## 2018-03-05 MED ORDER — AMOXICILLIN 500 MG PO CAPS: 500.0000 mg | ORAL_CAPSULE | Freq: Three times a day (TID) | ORAL | Status: DC

## 2018-03-05 MED ORDER — CALCIUM CARBONATE ANTACID 500 MG PO CHEW: 2.0000 | CHEWABLE_TABLET | ORAL | Status: DC | PRN

## 2018-03-05 MED ORDER — SODIUM CHLORIDE 0.9 % IV SOLN
2.0000 g | Freq: Four times a day (QID) | INTRAVENOUS | Status: DC
  Filled 2018-03-05 (×2): qty 2000

## 2018-03-05 MED ORDER — NIFEDIPINE 10 MG PO CAPS
10.0000 mg | ORAL_CAPSULE | Freq: Four times a day (QID) | ORAL | Status: DC
  Administered 2018-03-05 (×3): 10 mg via ORAL
  Filled 2018-03-05 (×3): qty 1

## 2018-03-05 MED ORDER — DOCUSATE SODIUM 100 MG PO CAPS: 100.0000 mg | ORAL_CAPSULE | Freq: Every day | ORAL | Status: DC

## 2018-03-05 MED ORDER — AZITHROMYCIN 250 MG PO TABS: 1000.0000 mg | ORAL_TABLET | Freq: Once | ORAL | Status: DC

## 2018-03-05 MED ORDER — ACETAMINOPHEN 325 MG PO TABS: 650.0000 mg | ORAL_TABLET | ORAL | Status: DC | PRN

## 2018-03-05 MED ORDER — AMOXICILLIN-POT CLAVULANATE 875-125 MG PO TABS: 1.0000 | ORAL_TABLET | Freq: Two times a day (BID) | ORAL | Status: DC

## 2018-03-05 MED ORDER — ZOLPIDEM TARTRATE 5 MG PO TABS: 5.0000 mg | ORAL_TABLET | Freq: Every evening | ORAL | Status: DC | PRN

## 2018-03-05 NOTE — MAU Note (Signed)
Arrival via EMS with complaint of contractions that started last night and have worsened over the day.

## 2018-03-05 NOTE — Progress Notes (Signed)
Assumed care of patient and was told by CNM Prothero that she was discharging patient. Previous CNM had entered admission orders.  When N. Prothero attempted to put in discharge orders she was unable to due to message on Epic regarding admissions orders needing co-signing. All admission orders were discontinued.    The problem persisted. I  called IT support  for assistance and a told them this was urgent.  A trouble ticket was placed.   Patient was asking to go home; we asked her to wait but she declined and decided to leave before paperwork was ready. Discharge Instructions were given by CNM Prothero including to return tomorrow at 1830 for 2nd Betamethasone shot. Patient stable upon discharge.     Had several conversations w/IT support and problem still not resolved.  They said they would get back to me with updates.

## 2018-03-05 NOTE — MAU Provider Note (Addendum)
History     CSN: [id number]  Arrival date and time: 03/05/18 1537   None     Chief Complaint  Patient presents with  . Contractions   Patient stated strong, painful contractions started 3 hours ago. She rates pain 7/10. She states there was a gush of clear fluid at one point. She denies vaginal bleeding.    OB History    Gravida  3   Para  1   Term  1   Preterm      AB  1   Living  1     SAB  1   TAB      Ectopic      Multiple  0   Live Births  1           Past Medical History:  Diagnosis Date  . Chlamydia   . Gonorrhea   . Medical history non-contributory     Past Surgical History:  Procedure Laterality Date  . FRACTURE SURGERY     left arm    History reviewed. No pertinent family history.  Social History   Tobacco Use  . Smoking status: Never Smoker  . Smokeless tobacco: Never Used  Substance Use Topics  . Alcohol use: No  . Drug use: No    Allergies:  Allergies  Allergen Reactions  . Shellfish Allergy Anaphylaxis    Medications Prior to Admission  Medication Sig Dispense Refill Last Dose  . ferrous sulfate 325 (65 FE) MG tablet Take 1 tablet (325 mg total) by mouth daily. 30 tablet 0   . metroNIDAZOLE (FLAGYL) 500 MG tablet Take 1 tablet (500 mg total) by mouth 2 (two) times daily. 14 tablet 0   . ondansetron (ZOFRAN) 4 MG tablet Take 1 tablet (4 mg total) by mouth every 6 (six) hours. 12 tablet 0 Past Month at Unknown time    Review of Systems  Gastrointestinal: Positive for abdominal pain.  All other systems reviewed and are negative.  Physical Exam   Vitals:   03/05/18 1546  BP: 120/75  Pulse: 91  Resp: 18  Temp: 98.4 F (36.9 C)  TempSrc: Oral  SpO2: 100%    Results for orders placed or performed during the hospital encounter of 03/05/18 (from the past 24 hour(s))  Fetal fibronectin     Status: Abnormal   Collection Time: 03/05/18  4:08 PM  Result Value Ref Range   Fetal Fibronectin POSITIVE (A) NEGATIVE   Group B strep by PCR     Status: None   Collection Time: 03/05/18  4:11 PM  Result Value Ref Range   Group B strep by PCR NEGATIVE NEGATIVE  Wet prep, genital     Status: Abnormal   Collection Time: 03/05/18  4:11 PM  Result Value Ref Range   Yeast Wet Prep HPF POC NONE SEEN NONE SEEN   Trich, Wet Prep NONE SEEN NONE SEEN   Clue Cells Wet Prep HPF POC NONE SEEN NONE SEEN   WBC, Wet Prep HPF POC MANY (A) NONE SEEN   Sperm PRESENT   Amnisure rupture of membrane (rom)not at [facility]     Status: None   Collection Time: 03/05/18  4:49 PM  Result Value Ref Range   Amnisure ROM NEGATIVE     Physical Exam  Constitutional: She is oriented to person, place, and time. She appears well-developed and well-nourished.  HENT:  Head: Normocephalic.  Eyes: Pupils are equal, round, and reactive to light.  Cardiovascular: Normal rate, regular rhythm  and normal heart sounds.  Respiratory: Effort normal and breath sounds normal.  GI: Soft. Bowel sounds are normal.  Genitourinary: Vagina normal and uterus normal.  Musculoskeletal: Normal range of motion.  Neurological: She is alert and oriented to person, place, and time.  Skin: Skin is warm and dry.  Psychiatric: She has a normal mood and affect. Her behavior is normal. Judgment and thought content normal.   VE: 0/30%/high, soft consistency   NST: 135 baseline, moderate beat to beat variability, +accels, +variable decel. Category 1.   Ultrasound today: SIUP, vertex, AFI 10.74, EFW 4lbs2oz.    MAU Course  Procedures Amnisure FFN Wet Prep GC/C Urinalysis NST  MDM Rule out SROM and any cause of preterm contractions (infection, dehydration). See if patient makes any cervical change. Stop preterm contractions with procardia. If contractions persist, will admit. Amnisure negative, FFN+. Lab initially reported positive amnisure and so ultrasound was ordered for fetal weight and positioning. Patient has had 2 doses of procardia and has not stopped  contracting. As BP is stable, will give 3rd dose of procardia and start LR bolus 57600ml/hr. As FFN+, will start betamethasone now.   Will give report to Bernerd PhoNancy Kabeer Hoagland, CNM who will take over care of this patient at 1900.  Pt contractions resolved with fluid.  SVE LTC posterior  Will discharge home  Follow up at regular visit. Return for betamethasone tomorrow.   Assessment and Plan  22 y.o. G3P1 at 1132w1d false labor Preterm contractions Cat 1 strip Procardia 10mg  for tocolysis, up to three doses  LR 500mg  bolus Betamethasone Discharge home follow up at regular visit.  Janeece RiggersEllis K Greer 03/05/2018, 6:38 PM

## 2018-03-05 NOTE — Discharge Instructions (Signed)
Braxton Hicks Contractions °Contractions of the uterus can occur throughout pregnancy, but they are not always a sign that you are in labor. You may have practice contractions called Braxton Hicks contractions. These false labor contractions are sometimes confused with true labor. °What are Braxton Hicks contractions? °Braxton Hicks contractions are tightening movements that occur in the muscles of the uterus before labor. Unlike true labor contractions, these contractions do not result in opening (dilation) and thinning of the cervix. Toward the end of pregnancy (32-34 weeks), Braxton Hicks contractions can happen more often and may become stronger. These contractions are sometimes difficult to tell apart from true labor because they can be very uncomfortable. You should not feel embarrassed if you go to the hospital with false labor. °Sometimes, the only way to tell if you are in true labor is for your health care provider to look for changes in the cervix. The health care provider will do a physical exam and may monitor your contractions. If you are not in true labor, the exam should show that your cervix is not dilating and your water has not broken. °If there are other health problems associated with your pregnancy, it is completely safe for you to be sent home with false labor. You may continue to have Braxton Hicks contractions until you go into true labor. °How to tell the difference between true labor and false labor °True labor °· Contractions last 30-70 seconds. °· Contractions become very regular. °· Discomfort is usually felt in the top of the uterus, and it spreads to the lower abdomen and low back. °· Contractions do not go away with walking. °· Contractions usually become more intense and increase in frequency. °· The cervix dilates and gets thinner. °False labor °· Contractions are usually shorter and not as strong as true labor contractions. °· Contractions are usually irregular. °· Contractions  are often felt in the front of the lower abdomen and in the groin. °· Contractions may go away when you walk around or change positions while lying down. °· Contractions get weaker and are shorter-lasting as time goes on. °· The cervix usually does not dilate or become thin. °Follow these instructions at home: °· Take over-the-counter and prescription medicines only as told by your health care provider. °· Keep up with your usual exercises and follow other instructions from your health care provider. °· Eat and drink lightly if you think you are going into labor. °· If Braxton Hicks contractions are making you uncomfortable: °? Change your position from lying down or resting to walking, or change from walking to resting. °? Sit and rest in a tub of warm water. °? Drink enough fluid to keep your urine pale yellow. Dehydration may cause these contractions. °? Do slow and deep breathing several times an hour. °· Keep all follow-up prenatal visits as told by your health care provider. This is important. °Contact a health care provider if: °· You have a fever. °· You have continuous pain in your abdomen. °Get help right away if: °· Your contractions become stronger, more regular, and closer together. °· You have fluid leaking or gushing from your vagina. °· You pass blood-tinged mucus (bloody show). °· You have bleeding from your vagina. °· You have low back pain that you never had before. °· You feel your baby’s head pushing down and causing pelvic pressure. °· Your baby is not moving inside you as much as it used to. °Summary °· Contractions that occur before labor are called Braxton   Hicks contractions, false labor, or practice contractions. °· Braxton Hicks contractions are usually shorter, weaker, farther apart, and less regular than true labor contractions. True labor contractions usually become progressively stronger and regular and they become more frequent. °· Manage discomfort from Braxton Hicks contractions by  changing position, resting in a warm bath, drinking plenty of water, or practicing deep breathing. °This information is not intended to replace advice given to you by your health care provider. Make sure you discuss any questions you have with your health care provider. °Document Released: 01/19/2017 Document Revised: 01/19/2017 Document Reviewed: 01/19/2017 °Elsevier Interactive Patient Education © 2018 Elsevier Inc. ° °

## 2018-03-06 ENCOUNTER — Inpatient Hospital Stay (HOSPITAL_COMMUNITY)
Admission: AD | Admit: 2018-03-06 | Discharge: 2018-03-06 | Disposition: A | Discharge status: Home / Self Care | Payer: Medicaid Other | Source: Ambulatory Visit | Attending: Obstetrics and Gynecology | Admitting: Obstetrics and Gynecology

## 2018-03-06 LAB — GC/CHLAMYDIA PROBE AMP (~~LOC~~) NOT AT ARMC
CHLAMYDIA, DNA PROBE: NEGATIVE
Neisseria Gonorrhea: NEGATIVE

## 2018-03-06 MED ORDER — BETAMETHASONE SOD PHOS & ACET 6 (3-3) MG/ML IJ SUSP
12.0000 mg | Freq: Once | INTRAMUSCULAR | Status: AC
  Administered 2018-03-06: 12 mg via INTRAMUSCULAR
  Filled 2018-03-06: qty 2

## 2018-03-07 LAB — URINE CULTURE

## 2018-03-11 ENCOUNTER — Inpatient Hospital Stay (HOSPITAL_COMMUNITY)
Admission: AD | Admit: 2018-03-11 | Discharge: 2018-03-11 | Disposition: A | Discharge status: Home / Self Care | Payer: Medicaid Other | Source: Ambulatory Visit | Attending: Obstetrics and Gynecology | Admitting: Obstetrics and Gynecology

## 2018-03-11 ENCOUNTER — Inpatient Hospital Stay (HOSPITAL_COMMUNITY): Payer: Medicaid Other

## 2018-03-11 DIAGNOSIS — R109 Unspecified abdominal pain: Secondary | ICD-10-CM

## 2018-03-11 DIAGNOSIS — Q899 Congenital malformation, unspecified: Secondary | ICD-10-CM

## 2018-03-11 DIAGNOSIS — Z3A33 33 weeks gestation of pregnancy: Secondary | ICD-10-CM | POA: Diagnosis not present

## 2018-03-11 DIAGNOSIS — N949 Unspecified condition associated with female genital organs and menstrual cycle: Secondary | ICD-10-CM

## 2018-03-11 LAB — URINALYSIS, ROUTINE W REFLEX MICROSCOPIC
Bilirubin Urine: NEGATIVE
Glucose, UA: NEGATIVE mg/dL
Hgb urine dipstick: NEGATIVE
Ketones, ur: NEGATIVE mg/dL
Nitrite: NEGATIVE
Protein, ur: NEGATIVE mg/dL
Specific Gravity, Urine: 1.017 (ref 1.005–1.030)
pH: 7 (ref 5.0–8.0)

## 2018-03-11 LAB — WET PREP, GENITAL
Clue Cells Wet Prep HPF POC: NONE SEEN
Sperm: NONE SEEN
Trich, Wet Prep: NONE SEEN
Yeast Wet Prep HPF POC: NONE SEEN

## 2018-03-11 LAB — FETAL FIBRONECTIN: Fetal Fibronectin: NEGATIVE

## 2018-03-11 MED ORDER — ACETAMINOPHEN 500 MG PO TABS
1000.0000 mg | ORAL_TABLET | Freq: Once | ORAL | Status: AC
  Administered 2018-03-11: 1000 mg via ORAL
  Filled 2018-03-11: qty 2

## 2018-03-11 MED ORDER — NIFEDIPINE 10 MG PO CAPS
10.0000 mg | ORAL_CAPSULE | Freq: Once | ORAL | Status: AC
Start: 2018-03-11 — End: 2018-03-11
  Administered 2018-03-11: 10 mg via ORAL
  Filled 2018-03-11: qty 1

## 2018-03-11 NOTE — MAU Note (Signed)
Discharge instructions reviewed and patient verbalized understanding. Patient alert and oriented to baseline. All belongings sent home with patient.

## 2018-03-11 NOTE — MAU Note (Signed)
Pamela Jefferson is a 22 y.o. at 4381w0d here in MAU reporting:  +lower abdominal and back pain Cramping in nature; intermittent Onset of complaint: started yesterday Pain score: 8/10 Vitals:   03/11/18 1409  BP: 131/62  Pulse: 97  Resp: 17  Temp: 98 F (36.7 C)  SpO2: 100%     FHT: 133 Lab orders placed from triage: ua

## 2018-03-12 LAB — GC/CHLAMYDIA PROBE AMP (~~LOC~~) NOT AT ARMC
Chlamydia: NEGATIVE
Neisseria Gonorrhea: NEGATIVE

## 2018-03-19 LAB — OB RESULTS CONSOLE GBS: STREP GROUP B AG: POSITIVE

## 2018-03-28 ENCOUNTER — Inpatient Hospital Stay (HOSPITAL_COMMUNITY)
Admission: AD | Admit: 2018-03-28 | Discharge: 2018-03-28 | Disposition: A | Discharge status: Home / Self Care | Payer: Medicaid Other | Source: Ambulatory Visit | Attending: Obstetrics and Gynecology | Admitting: Obstetrics and Gynecology

## 2018-03-28 ENCOUNTER — Encounter (HOSPITAL_COMMUNITY): Payer: Self-pay | Admitting: *Deleted

## 2018-03-28 DIAGNOSIS — N949 Unspecified condition associated with female genital organs and menstrual cycle: Secondary | ICD-10-CM

## 2018-03-28 DIAGNOSIS — O26893 Other specified pregnancy related conditions, third trimester: Secondary | ICD-10-CM | POA: Diagnosis not present

## 2018-03-28 DIAGNOSIS — R102 Pelvic and perineal pain: Secondary | ICD-10-CM | POA: Insufficient documentation

## 2018-03-28 DIAGNOSIS — Z3A35 35 weeks gestation of pregnancy: Secondary | ICD-10-CM | POA: Diagnosis not present

## 2018-03-28 NOTE — MAU Note (Signed)
Pt left without receiving discharge instructions, informed pt instructions will be ready momentarily, pt states she can't wait & is leaving.

## 2018-03-28 NOTE — MAU Note (Signed)
Urine in lab 

## 2018-03-28 NOTE — MAU Note (Signed)
Pt reports pain in her pelvis and vaginal area since last pm, hurts to walk. Denies bleeding.

## 2018-03-28 NOTE — MAU Provider Note (Signed)
History     CSN: [id number]  Arrival date and time: 03/28/18 1432   None     Chief Complaint  Patient presents with  . Pelvic Pain   Patient reports sharp, intermittent pain in her vagina and upper thighs. The pain occurs only when the patient is moving: walking or turning over in bed, it does not occur when she is still. The pain is felt low in her pelvis, at the midline approximately over the symphysis pubis. The pain radiates down to her upper thighs. She denies feeling any cramping or contractions, loss of fluid, or bleeding, she reports good fetal movement.    OB History    Gravida  3   Para  1   Term  1   Preterm      AB  1   Living  1     SAB  1   TAB      Ectopic      Multiple  0   Live Births  1           Past Medical History:  Diagnosis Date  . Chlamydia   . Gonorrhea   . Medical history non-contributory     Past Surgical History:  Procedure Laterality Date  . FRACTURE SURGERY     left arm    History reviewed. No pertinent family history.  Social History   Tobacco Use  . Smoking status: Never Smoker  . Smokeless tobacco: Never Used  Substance Use Topics  . Alcohol use: No  . Drug use: No    Allergies:  Allergies  Allergen Reactions  . Shellfish Allergy Anaphylaxis    Medications Prior to Admission  Medication Sig Dispense Refill Last Dose  . ferrous sulfate 325 (65 FE) MG tablet Take 1 tablet (325 mg total) by mouth daily. 30 tablet 0 03/27/2018 at Unknown time  . Prenatal Vit-Fe Fumarate-FA (PRENATAL MULTIVITAMIN) TABS tablet Take 1 tablet by mouth daily at 12 noon.   03/28/2018 at Unknown time    Review of Systems  Gastrointestinal: Negative for abdominal pain.  Genitourinary: Positive for pelvic pain. Negative for vaginal pain.  All other systems reviewed and are negative.  Physical Exam   Vitals:   03/28/18 1453  BP: 109/67  Pulse: 96  Resp: 16  Temp: 98.2 F (36.8 C)  TempSrc: Oral  SpO2: 98%  Weight: 85.3  kg (188 lb)  Height: 5\' 7"  (1.702 m)    Physical Exam  Nursing note and vitals reviewed. Constitutional: She is oriented to person, place, and time. She appears well-developed and well-nourished.  HENT:  Head: Normocephalic.  Eyes: Pupils are equal, round, and reactive to light.  Cardiovascular: Normal rate, regular rhythm and normal heart sounds.  Respiratory: Effort normal and breath sounds normal.  Musculoskeletal: Normal range of motion. She exhibits tenderness.  Pubic symphysis was tender to palpation   Neurological: She is alert and oriented to person, place, and time.  Skin: Skin is warm and dry.  Psychiatric: She has a normal mood and affect. Her behavior is normal. Judgment and thought content normal.    MAU Course  Procedures NST  MDM Patient only experiencing sharp, intermittent pain with movement suggests musculoskeletal origin. Location of pain indicates pubic symphysis dysfunction, and the pubic symphysis was tender to palpation. Suggested patient use alternating heat and cold, tylenol OTC, and pregnancy support belt for discomfort. Patient to keep regular appointment with [facility].   Assessment and Plan  22 y.o. G3P1 at [redacted]w[redacted]d Pubic  symphysis dysfunction of pregnancy Category 1, reactive NST Manage with heat and cold, tylenol OTC PRN, and pregnancy support belt  Patient will follow-up at next scheduled appointment   Janeece RiggersEllis K Jaxxen Voong 03/28/2018, 4:15 PM

## 2018-04-02 ENCOUNTER — Telehealth (HOSPITAL_COMMUNITY): Payer: Self-pay | Admitting: *Deleted

## 2018-04-02 ENCOUNTER — Encounter (HOSPITAL_COMMUNITY): Payer: Self-pay | Admitting: *Deleted

## 2018-04-03 ENCOUNTER — Other Ambulatory Visit: Payer: Self-pay | Admitting: Obstetrics and Gynecology

## 2018-04-03 NOTE — Telephone Encounter (Signed)
Preadmission screen  

## 2018-04-09 ENCOUNTER — Inpatient Hospital Stay (HOSPITAL_COMMUNITY)
Admission: RE | Admit: 2018-04-09 | Discharge: 2018-04-11 | DRG: 807 | Disposition: A | Discharge status: Home / Self Care | Payer: Medicaid Other | Attending: Obstetrics and Gynecology | Admitting: Obstetrics and Gynecology

## 2018-04-09 ENCOUNTER — Inpatient Hospital Stay (HOSPITAL_COMMUNITY): Payer: Medicaid Other | Admitting: Anesthesiology

## 2018-04-09 ENCOUNTER — Encounter (HOSPITAL_COMMUNITY): Payer: Self-pay

## 2018-04-09 DIAGNOSIS — O99344 Other mental disorders complicating childbirth: Secondary | ICD-10-CM | POA: Diagnosis present

## 2018-04-09 DIAGNOSIS — O9902 Anemia complicating childbirth: Secondary | ICD-10-CM | POA: Diagnosis present

## 2018-04-09 DIAGNOSIS — O99824 Streptococcus B carrier state complicating childbirth: Secondary | ICD-10-CM | POA: Diagnosis present

## 2018-04-09 DIAGNOSIS — F329 Major depressive disorder, single episode, unspecified: Secondary | ICD-10-CM | POA: Diagnosis present

## 2018-04-09 DIAGNOSIS — Z3A37 37 weeks gestation of pregnancy: Secondary | ICD-10-CM

## 2018-04-09 DIAGNOSIS — D649 Anemia, unspecified: Secondary | ICD-10-CM | POA: Diagnosis present

## 2018-04-09 DIAGNOSIS — IMO0001 Reserved for inherently not codable concepts without codable children: Secondary | ICD-10-CM | POA: Diagnosis present

## 2018-04-09 LAB — CBC
HCT: 29.8 % — ABNORMAL LOW (ref 36.0–46.0)
Hemoglobin: 9.6 g/dL — ABNORMAL LOW (ref 12.0–15.0)
MCH: 29.4 pg (ref 26.0–34.0)
MCHC: 32.2 g/dL (ref 30.0–36.0)
MCV: 91.1 fL (ref 78.0–100.0)
PLATELETS: 261 10*3/uL (ref 150–400)
RBC: 3.27 MIL/uL — ABNORMAL LOW (ref 3.87–5.11)
RDW: 14.9 % (ref 11.5–15.5)
WBC: 12.9 10*3/uL — ABNORMAL HIGH (ref 4.0–10.5)

## 2018-04-09 LAB — RPR: RPR: NONREACTIVE

## 2018-04-09 LAB — TYPE AND SCREEN
ABO/RH(D): B POS
Antibody Screen: NEGATIVE

## 2018-04-09 LAB — ABO/RH: ABO/RH(D): B POS

## 2018-04-09 MED ORDER — ONDANSETRON HCL 4 MG/2ML IJ SOLN
4.0000 mg | Freq: Four times a day (QID) | INTRAMUSCULAR | Status: DC | PRN
  Administered 2018-04-09: 4 mg via INTRAVENOUS
  Filled 2018-04-09: qty 2

## 2018-04-09 MED ORDER — WITCH HAZEL-GLYCERIN EX PADS: 1.0000 "application " | MEDICATED_PAD | CUTANEOUS | Status: DC | PRN

## 2018-04-09 MED ORDER — ONDANSETRON HCL 4 MG PO TABS: 4.0000 mg | ORAL_TABLET | ORAL | Status: DC | PRN

## 2018-04-09 MED ORDER — LACTATED RINGERS IV SOLN
500.0000 mL | INTRAVENOUS | Status: DC | PRN
  Administered 2018-04-09 (×2): 500 mL via INTRAVENOUS

## 2018-04-09 MED ORDER — SIMETHICONE 80 MG PO CHEW: 80.0000 mg | CHEWABLE_TABLET | ORAL | Status: DC | PRN

## 2018-04-09 MED ORDER — EPHEDRINE 5 MG/ML INJ
10.0000 mg | INTRAVENOUS | Status: DC | PRN
  Filled 2018-04-09: qty 2

## 2018-04-09 MED ORDER — ACETAMINOPHEN 325 MG PO TABS
650.0000 mg | ORAL_TABLET | ORAL | Status: DC | PRN
  Administered 2018-04-10 (×2): 650 mg via ORAL
  Filled 2018-04-09 (×3): qty 2

## 2018-04-09 MED ORDER — OXYTOCIN BOLUS FROM INFUSION
500.0000 mL | Freq: Once | INTRAVENOUS | Status: AC
  Administered 2018-04-09: 500 mL via INTRAVENOUS

## 2018-04-09 MED ORDER — TETANUS-DIPHTH-ACELL PERTUSSIS 5-2.5-18.5 LF-MCG/0.5 IM SUSP: 0.5000 mL | Freq: Once | INTRAMUSCULAR | Status: DC

## 2018-04-09 MED ORDER — SENNOSIDES-DOCUSATE SODIUM 8.6-50 MG PO TABS
2.0000 | ORAL_TABLET | ORAL | Status: DC
  Administered 2018-04-09 – 2018-04-10 (×2): 2 via ORAL
  Filled 2018-04-09 (×2): qty 2

## 2018-04-09 MED ORDER — LIDOCAINE HCL (PF) 1 % IJ SOLN
30.0000 mL | INTRAMUSCULAR | Status: DC | PRN
  Filled 2018-04-09: qty 30

## 2018-04-09 MED ORDER — PHENYLEPHRINE 40 MCG/ML (10ML) SYRINGE FOR IV PUSH (FOR BLOOD PRESSURE SUPPORT)
80.0000 ug | PREFILLED_SYRINGE | INTRAVENOUS | Status: DC | PRN
  Filled 2018-04-09 (×2): qty 10
  Filled 2018-04-09: qty 5

## 2018-04-09 MED ORDER — OXYTOCIN 40 UNITS IN LACTATED RINGERS INFUSION - SIMPLE MED: 1.0000 m[IU]/min | INTRAVENOUS | Status: DC

## 2018-04-09 MED ORDER — SODIUM CHLORIDE 0.9 % IV SOLN
5.0000 10*6.[IU] | Freq: Once | INTRAVENOUS | Status: AC
  Administered 2018-04-09: 5 10*6.[IU] via INTRAVENOUS
  Filled 2018-04-09: qty 5

## 2018-04-09 MED ORDER — BENZOCAINE-MENTHOL 20-0.5 % EX AERO
1.0000 "application " | INHALATION_SPRAY | CUTANEOUS | Status: DC | PRN
  Administered 2018-04-10: 1 via TOPICAL
  Filled 2018-04-09: qty 56

## 2018-04-09 MED ORDER — PHENYLEPHRINE 40 MCG/ML (10ML) SYRINGE FOR IV PUSH (FOR BLOOD PRESSURE SUPPORT)
80.0000 ug | PREFILLED_SYRINGE | INTRAVENOUS | Status: AC | PRN
  Administered 2018-04-09 (×3): 80 ug via INTRAVENOUS

## 2018-04-09 MED ORDER — OXYTOCIN 40 UNITS IN LACTATED RINGERS INFUSION - SIMPLE MED
2.5000 [IU]/h | INTRAVENOUS | Status: DC
  Administered 2018-04-09: 2.5 [IU]/h via INTRAVENOUS

## 2018-04-09 MED ORDER — LIDOCAINE HCL (PF) 1 % IJ SOLN
INTRAMUSCULAR | Status: DC | PRN
  Administered 2018-04-09 (×2): 5 mL via EPIDURAL

## 2018-04-09 MED ORDER — PHENYLEPHRINE 40 MCG/ML (10ML) SYRINGE FOR IV PUSH (FOR BLOOD PRESSURE SUPPORT): 80.0000 ug | PREFILLED_SYRINGE | INTRAVENOUS | Status: DC | PRN

## 2018-04-09 MED ORDER — SOD CITRATE-CITRIC ACID 500-334 MG/5ML PO SOLN: 30.0000 mL | ORAL | Status: DC | PRN

## 2018-04-09 MED ORDER — LACTATED RINGERS IV SOLN
INTRAVENOUS | Status: DC
  Administered 2018-04-09 (×4): via INTRAVENOUS

## 2018-04-09 MED ORDER — PENICILLIN G POT IN DEXTROSE 60000 UNIT/ML IV SOLN
3.0000 10*6.[IU] | INTRAVENOUS | Status: DC
  Administered 2018-04-09 (×4): 3 10*6.[IU] via INTRAVENOUS
  Filled 2018-04-09 (×6): qty 50

## 2018-04-09 MED ORDER — FENTANYL CITRATE (PF) 100 MCG/2ML IJ SOLN: 50.0000 ug | INTRAMUSCULAR | Status: DC | PRN

## 2018-04-09 MED ORDER — TERBUTALINE SULFATE 1 MG/ML IJ SOLN
0.2500 mg | Freq: Once | INTRAMUSCULAR | Status: DC | PRN
  Filled 2018-04-09: qty 1

## 2018-04-09 MED ORDER — ACETAMINOPHEN 325 MG PO TABS: 650.0000 mg | ORAL_TABLET | ORAL | Status: DC | PRN

## 2018-04-09 MED ORDER — DIPHENHYDRAMINE HCL 50 MG/ML IJ SOLN: 12.5000 mg | INTRAMUSCULAR | Status: DC | PRN

## 2018-04-09 MED ORDER — MISOPROSTOL 25 MCG QUARTER TABLET
25.0000 ug | ORAL_TABLET | ORAL | Status: DC | PRN
  Administered 2018-04-09 (×2): 25 ug via VAGINAL
  Filled 2018-04-09 (×3): qty 1

## 2018-04-09 MED ORDER — PRENATAL MULTIVITAMIN CH
1.0000 | ORAL_TABLET | Freq: Every day | ORAL | Status: DC
  Administered 2018-04-10 – 2018-04-11 (×2): 1 via ORAL
  Filled 2018-04-09 (×2): qty 1

## 2018-04-09 MED ORDER — LACTATED RINGERS IV SOLN: 500.0000 mL | Freq: Once | INTRAVENOUS | Status: DC

## 2018-04-09 MED ORDER — OXYTOCIN 40 UNITS IN LACTATED RINGERS INFUSION - SIMPLE MED
1.0000 m[IU]/min | INTRAVENOUS | Status: DC
  Administered 2018-04-09: 1 m[IU]/min via INTRAVENOUS
  Filled 2018-04-09: qty 1000

## 2018-04-09 MED ORDER — DIBUCAINE 1 % RE OINT: 1.0000 "application " | TOPICAL_OINTMENT | RECTAL | Status: DC | PRN

## 2018-04-09 MED ORDER — FLEET ENEMA 7-19 GM/118ML RE ENEM: 1.0000 | ENEMA | RECTAL | Status: DC | PRN

## 2018-04-09 MED ORDER — IBUPROFEN 600 MG PO TABS
600.0000 mg | ORAL_TABLET | Freq: Four times a day (QID) | ORAL | Status: DC
  Administered 2018-04-09 – 2018-04-11 (×7): 600 mg via ORAL
  Filled 2018-04-09 (×7): qty 1

## 2018-04-09 MED ORDER — ZOLPIDEM TARTRATE 5 MG PO TABS: 5.0000 mg | ORAL_TABLET | Freq: Every evening | ORAL | Status: DC | PRN

## 2018-04-09 MED ORDER — DIPHENHYDRAMINE HCL 25 MG PO CAPS
25.0000 mg | ORAL_CAPSULE | Freq: Four times a day (QID) | ORAL | Status: DC | PRN
Start: 2018-04-09 — End: 2018-04-11

## 2018-04-09 MED ORDER — ONDANSETRON HCL 4 MG/2ML IJ SOLN: 4.0000 mg | INTRAMUSCULAR | Status: DC | PRN

## 2018-04-09 MED ORDER — COCONUT OIL OIL: 1.0000 "application " | TOPICAL_OIL | Status: DC | PRN

## 2018-04-09 MED ORDER — FERROUS SULFATE 325 (65 FE) MG PO TABS
325.0000 mg | ORAL_TABLET | Freq: Every day | ORAL | Status: DC
  Administered 2018-04-10 – 2018-04-11 (×2): 325 mg via ORAL
  Filled 2018-04-09 (×2): qty 1

## 2018-04-09 MED ORDER — EPHEDRINE 5 MG/ML INJ: 10.0000 mg | INTRAVENOUS | Status: DC | PRN

## 2018-04-09 MED ORDER — LACTATED RINGERS IV SOLN
500.0000 mL | Freq: Once | INTRAVENOUS | Status: AC
  Administered 2018-04-09: 500 mL via INTRAVENOUS

## 2018-04-09 MED ORDER — FENTANYL 2.5 MCG/ML BUPIVACAINE 1/10 % EPIDURAL INFUSION (WH - ANES)
14.0000 mL/h | INTRAMUSCULAR | Status: DC | PRN
  Administered 2018-04-09 (×2): 14 mL/h via EPIDURAL
  Filled 2018-04-09 (×2): qty 100

## 2018-04-09 NOTE — Progress Notes (Signed)
Labor Progress Note  Subjective: Pamela Jefferson, 22 y.o., G3P1011, with an IUP @ 5231w1d, presented for induction of labor due to umbilical varix 1.4cm. Pt found sitting on the birthing ball for last 20 mins, pt endorses wanting a c/s because she is tired and the contractions are hurting. Pt reviewed risk and benefits again and decided to continue with induction. Pt wants epidural now.    Patient Active Problem List   Diagnosis Date Noted  . 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 Abdominal pain 03/11/2018  . Round ligament pain 03/11/2018  . Preterm labor 03/05/2018  . Depression   . Suicidal ideation   . Suicidal ideations   . 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 MDD (major depressive disorder), single episode, severe (HCC) 01/20/2016      Objective: BP 102/65   Pulse 80   Temp 98.3 F (36.8 C) (Oral)   Resp 16   Ht 5\' 7"  (1.702 m)   Wt 89.2 kg (196 lb 9.6 oz)   LMP 07/03/2017 (Approximate)   SpO2 98%   BMI 30.79 kg/m  No intake/output data recorded. No intake/output data recorded.   NST: FHR baseline 135 bpm, Variability: moderate, Accelerations:present, Decelerations:  Absent= Cat 1/Reactive CTX:  regular, every 2-3 minutes, lasting 40-80 secodns Uterus gravid, soft non tender, mild to palpate with contractions.  SVE:  Dilation: 1 Effacement (%): 20% Station: -3 Exam by:: Lesly RubensteinJade M   Pitocin at (Not started yet) mUn/min  Assessment:  Pamela CohoNatalie Jefferson, 22 y.o., [phone], with an IUP @ 2331w1d, presented for induction of labor due to umbilical varix 1.4cm. Pt has H/O depression. Pt stable in room wants epidural placed now then foley bulb after.   Patient Active Problem List   Diagnosis Date Noted  . 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 Abdominal pain 03/11/2018  . Round ligament pain 03/11/2018  .  Preterm labor 03/05/2018  . Depression   . Suicidal ideation   . Suicidal ideations   . 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 MDD (major depressive disorder), single episode, severe (HCC) 01/20/2016   NICHD: Category 1  Membranes:  Intact no s/s of infection  Induction:    Cytotec x 1 @ 0100, 2 @ 0500  Foley Bulb: Unable to insert due to closed exam  Pitocin - Have not started yet.   Pain management:               Epidural placement: Plans for placement PRN  GBS Positive  Abx: Penicillin X 3 doses, last at 0900  Plan: Continue labor plan Continuous monitoring Clear liquids Ambulate, upright and on birthing ball to facility fetal decent.  Frequent position changes to facilitate fetal rotation and descent. Will reassess with cervical exam at 1130 or earlier if necessary  Plan to place foley bulb after epidural placed which should happen now, then start pitocin at 1x1 Once pt cervical exam is a bishop score of 8, plan to start pitocin per protocol Anticipate labor progression and vaginal delivery.   Md Dillard to be updated PRN.   Dale DurhamJade Daunte Oestreich, NP-C, CNM, MSN 04/09/2018. 8:00 AM

## 2018-04-09 NOTE — Progress Notes (Signed)
Labor Progress Note  Subjective: Pamela Jefferson, 22 y.o., G3P1011, with an IUP @ 3838w1d, presented for induction of labor due to umbilical varix 1.4cm. Pt found in bed sitting straight up, pt complains of feeling lightheaded and nausea, pt endorses she threw up x1 and RN gave zofran. Pt denies cp or sob. +FM. RN reported Bp 84/50  Patient Active Problem List   Diagnosis Date Noted  . 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 Abdominal pain 03/11/2018  . Round ligament pain 03/11/2018  . Preterm labor 03/05/2018  . Depression   . Suicidal ideation   . Suicidal ideations   . 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 MDD (major depressive disorder), single episode, severe (HCC) 01/20/2016      Objective: BP (!) 92/50   Pulse (!) 59   Temp 97.8 F (36.6 C) (Axillary)   Resp 16   Ht 5\' 7"  (1.702 m)   Wt 89.2 kg (196 lb 9.6 oz)   LMP 07/03/2017 (Approximate)   SpO2 100%   BMI 30.79 kg/m  I/O last 3 completed shifts: In: 1537.4 [I.V.:1537.4] Out: 1100 [Urine:1100] No intake/output data recorded.  Patient Vitals for the past 24 hrs:  BP Temp Temp src Pulse Resp SpO2 Height Weight  04/09/18 1841 (!) 92/50 - - (!) 59 - 100 % - -  04/09/18 1838 (!) 87/43 - - 61 - - - -  04/09/18 1836 - - - - - 100 % - -  04/09/18 1835 (!) 84/50 - - 77 - - - -  04/09/18 1831 (!) 89/49 - - 74 16 - - -  04/09/18 1828 (!) 90/47 - - 73 - - - -  04/09/18 1820 (!) 89/42 - - 71 18 - - -  04/09/18 1800 (!) 94/53 97.8 F (36.6 C) Axillary 79 16 - - -  04/09/18 1740 (!) 94/45 - - 91 - - - -  04/09/18 1730 104/60 - - (!) 114 20 - - -  04/09/18 1705 (!) 105/56 - - 80 - - - -  04/09/18 1700 (!) 87/72 - - 90 20 - - -  04/09/18 1630 (!) 95/59 - - 94 18 - - -  04/09/18 1600 (!) 100/54 97.9 F (36.6 C) Oral 71 18 - - -  04/09/18 1500 (!) 93/45 - - 70 - - - -  04/09/18 1430 (!) 101/59 - - 78 18 - - -  04/09/18  1400 105/63 - - 86 18 - - -  04/09/18 1347 (!) 93/41 - - 74 18 - - -  04/09/18 1331 (!) 94/43 - - 72 - - - -  04/09/18 1300 (!) 97/53 - - 78 18 - - -  04/09/18 1248 (!) 95/57 - - 86 - - - -  04/09/18 1230 111/69 - - 79 18 100 % - -  04/09/18 1225 107/62 - - 86 - 100 % - -  04/09/18 1220 107/61 - - 74 - 100 % - -  04/09/18 1215 97/66 - - 77 - 100 % - -  04/09/18 1210 113/64 - - 75 - 100 % - -  04/09/18 1205 (!) 100/33 - - 84 - 100 % - -  04/09/18 1200 115/71 98.2 F (36.8 C) Oral 89 20 100 % - -  04/09/18 1155 114/68 - - 86 20 100 % - -  04/09/18 1153 117/70 - - 77 - 99 % - -  04/09/18 1151  117/70 - - 77 - 99 % - -  04/09/18 1149 114/72 - - 83 20 - - -  04/09/18 1036 118/71 - - 79 - - - -  04/09/18 0903 102/65 - - 80 - - - -  04/09/18 0714 116/70 - - 89 - - - -  04/09/18 0609 (!) 103/55 - - 78 16 - - -  04/09/18 0505 (!) 102/52 98.3 F (36.8 C) Oral 86 16 - - -  04/09/18 0347 (!) 92/48 - - 76 16 - - -  04/09/18 0301 (!) 97/51 - - 94 16 - - -  04/09/18 0254 (!) 94/49 - - 94 14 - - -  04/09/18 0152 114/63 - - 95 16 - - -  04/09/18 0106 - 98.5 F (36.9 C) Oral 92 18 98 % 5\' 7"  (1.702 m) 89.2 kg (196 lb 9.6 oz)  04/09/18 0040 119/67 97.9 F (36.6 C) Oral 95 20 - - -   NST: FHR baseline 130 bpm, Variability: minimal Accelerations: Absent, Decelerations:  Absent= Cat 2 CTX:  regular, every 2-5 minutes, lasting 40-80 secodns Uterus gravid, soft non tender, mild to palpate with contractions.  SVE:  Dilation: 5 Effacement (%): 80% Station: -2 Exam by:: Ramelo Oetken M  Pitocin at 6 mUn/min  Assessment:  Pamela Jefferson, 22 y.o., [phone], with an IUP @ [redacted]w[redacted]d, presented for induction of labor due to umbilical varix 1.4cm. Pt has H/O depression. Pt stable in room comfortable with epirudal , Pt ad low BP in 89/42, RN gave x 3 doses of phenylephrine and BP now 92/50. Pt had a cat 2 strip, 500mg  bolus given, position change and fetal scalp stimulation with response in a acell was noted and  performed.  Pt left in stable condition on left side with peanut ball.   Patient Active Problem List   Diagnosis Date Noted  . 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 Abdominal pain 03/11/2018  . Round ligament pain 03/11/2018  . Preterm labor 03/05/2018  . Depression   . Suicidal ideation   . Suicidal ideations   . 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 MDD (major depressive disorder), single episode, severe (HCC) 01/20/2016   NICHD: Category 2  Membranes:  AROM: Clear at 1630 on 07/22, no s/s of infection  Induction:    Cytotec x 1 @ 0100, 2 @ 0500  Foley Bulb: Placed at 1200, out at 1600  Pitocin - 6  Pain management:               Epidural placement: Placed at 1145 on 07/22  GBS Positive  Abx: Penicillin X 5 doses, last at 1700  Plan: Continue labor plan Continuous monitoring Clear liquids Position upright and on peanut ball to facility fetal decent.  Frequent position changes to facilitate fetal rotation and descent. Will reassess with cervical exam at 2000 or earlier if necessary Increase Continuous pitocin at 2x2, now at 6 Anticipate labor progression and vaginal delivery.   Md Dillard updated.  Will report to Dr Richardson Dopp.   Dale Grant-Valkaria, NP-C, CNM, MSN 04/09/2018. 6:00 PM

## 2018-04-09 NOTE — Progress Notes (Signed)
Labor Progress Note  Subjective: Pamela Jefferson, 22 y.o., G3P1011, with an IUP @ 6952w1d, presented for induction of labor due to umbilical varix 1.4cm. Pt found resting bed post epidural placement, pt feels better, no pain, epidural working.   Patient Active Problem List   Diagnosis Date Noted  . 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 Abdominal pain 03/11/2018  . Round ligament pain 03/11/2018  . Preterm labor 03/05/2018  . Depression   . Suicidal ideation   . Suicidal ideations   . 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 MDD (major depressive disorder), single episode, severe (HCC) 01/20/2016      Objective: BP 115/71   Pulse 89   Temp 98.2 F (36.8 C) (Oral)   Resp 20   Ht 5\' 7"  (1.702 m)   Wt 89.2 kg (196 lb 9.6 oz)   LMP 07/03/2017 (Approximate)   SpO2 100%   BMI 30.79 kg/m  No intake/output data recorded. No intake/output data recorded.   NST: FHR baseline 125 bpm, Variability: moderate, Accelerations:present, Decelerations:  Absent= Cat 1/Reactive CTX:  regular, every 2-3 minutes, lasting 40-80 secodns Uterus gravid, soft non tender, mild to palpate with contractions.  SVE:  Dilation: 2 Effacement (%): 50% Station: -2 Exam by:: Alferd Obryant M  Foley Bulb placed with SVE Pitocin at (to start at 1X1) mUn/min  Assessment:  Pamela Jefferson, 22 y.o., G3P1011, with an IUP @ 1352w1d, presented for induction of labor due to umbilical varix 1.4cm. Pt has H/O depression. Pt stable in room comfortable with epirudal and tolerated foley bulb placement well.    Patient Active Problem List   Diagnosis Date Noted  . 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 Abdominal pain 03/11/2018  . Round ligament pain 03/11/2018  . Preterm labor 03/05/2018  . Depression   . Suicidal ideation   . Suicidal ideations   . Severe  major depression without psychotic features (HCC) 01/20/2016  . MDD (major depressive disorder), single episode, severe (HCC) 01/20/2016   NICHD: Category 1  Membranes:  Intact no s/s of infection  Induction:    Cytotec x 1 @ 0100, 2 @ 0500  Foley Bulb: Placxed at 1200  Pitocin - Will start 1X1   Pain management:               Epidural placement: Placed at 1145 on 07/22  GBS Positive  Abx: Penicillin X 3 doses, last at 0900  Plan: Continue labor plan Continuous monitoring Clear liquids Position upright and on peanut ball to facility fetal decent.  Frequent position changes to facilitate fetal rotation and descent. Will reassess with cervical exam at 1800 or earlier if necessary Plan to start pitocin at 1x1, now Anticipate labor progression and vaginal delivery.   Md Dillard to be updated PRN.   Dale DurhamJade Veria Stradley, NP-C, CNM, MSN 04/09/2018. 8:00 AM

## 2018-04-09 NOTE — Progress Notes (Signed)
S: Pt resting O: BP 109/67   Pulse 74   Temp 97.6 F (36.4 C) (Oral)   Resp 16   Ht 5\' 7"  (1.702 m)   Wt 89.2 kg (196 lb 9.6 oz)   LMP 07/03/2017 (Approximate)   SpO2 99%   BMI 30.79 kg/m  A/P:  Katrine CohoNatalie Gren, 22 y.o., G3P1011, with an IUP @ 4256w1d, presented for induction of labor due to umbilical varix 1.4cm  Cat 1 fetal strip, will continue with POC and monitor.   Dr Richardson Doppole given report.   Huong Luthi 7:33 PM 04/09/18

## 2018-04-09 NOTE — Anesthesia Preprocedure Evaluation (Signed)
Anesthesia Evaluation  Patient identified by MRN, date of birth, ID band Patient awake    Reviewed: Allergy & Precautions, NPO status , Patient's Chart, lab work & pertinent test results  History of Anesthesia Complications Negative for: history of anesthetic complications  Airway Mallampati: II  TM Distance: >3 FB Neck ROM: Full    Dental  (+) Dental Advisory Given   Pulmonary neg pulmonary ROS,    breath sounds clear to auscultation       Cardiovascular negative cardio ROS   Rhythm:Regular Rate:Normal     Neuro/Psych negative neurological ROS     GI/Hepatic negative GI ROS, Neg liver ROS,   Endo/Other  negative endocrine ROS  Renal/GU negative Renal ROS     Musculoskeletal   Abdominal   Peds  Hematology  (+) Blood dyscrasia (Hb 9.6, plt 261k), anemia ,   Anesthesia Other Findings   Reproductive/Obstetrics (+) Pregnancy                             Anesthesia Physical Anesthesia Plan  ASA: II  Anesthesia Plan: Epidural   Post-op Pain Management:    Induction:   PONV Risk Score and Plan: 2 and Treatment may vary due to age or medical condition  Airway Management Planned: Natural Airway  Additional Equipment:   Intra-op Plan:   Post-operative Plan:   Informed Consent: I have reviewed the patients History and Physical, chart, labs and discussed the procedure including the risks, benefits and alternatives for the proposed anesthesia with the patient or authorized representative who has indicated his/her understanding and acceptance.   Dental advisory given  Plan Discussed with:   Anesthesia Plan Comments: (Patient identified. Risks/Benefits/Options discussed with patient including but not limited to bleeding, infection, nerve damage, paralysis, failed block, incomplete pain control, headache, blood pressure changes, nausea, vomiting, reactions to medication both or  allergic, itching and postpartum back pain. Confirmed with bedside nurse the patient's most recent platelet count. Confirmed with patient that they are not currently taking any anticoagulation, have any bleeding history or any family history of bleeding disorders. Patient expressed understanding and wished to proceed. All questions were answered. )        Anesthesia Quick Evaluation

## 2018-04-09 NOTE — H&P (Signed)
Pamela Jefferson is a 22 y.o. femaleG3P1011 @ 4760w1d  presenting for induction of labor due to umbilical varix 1.4cm. Pt is formula feeding. OB History    Gravida  3   Para  1   Term  1   Preterm      AB  1   Living  1     SAB  1   TAB      Ectopic      Multiple  0   Live Births  1          Past Medical History:  Diagnosis Date  . Anemia   . Anxiety   . Chlamydia   . Depression   . Gonorrhea   . Hx of gastric ulcer   . Medical history non-contributory    Past Surgical History:  Procedure Laterality Date  . FRACTURE SURGERY     left arm   Family History: family history is not on file. Social History:  reports that she has never smoked. She has never used smokeless tobacco. She reports that she does not drink alcohol or use drugs.     Maternal Diabetes: no Genetic Screening: neg Maternal Ultrasounds/Referrals:  Fetal Ultrasounds or other Referrals:  MFM Maternal Substance Abuse:  no Significant Maternal Medications:  None Significant Maternal Lab Results:  GBS positive Other Comments:  Umbilical Varix  Review of Systems  All other systems reviewed and are negative.  Maternal Medical History:  Reason for admission: See HPI  Contractions: Frequency: rare.    Fetal activity: Perceived fetal activity is normal.    Prenatal complications: no prenatal complications Umbilical Varix  Prenatal Complications - Diabetes: none.    Dilation: Fingertip Effacement (%): Thick Station: Ballotable Exam by:: Danaher CorporationMadison Hicks, RN  Blood pressure (!) 102/52, pulse 86, temperature 98.3 F (36.8 C), temperature source Oral, resp. rate 16, height 5\' 7"  (1.702 m), weight 196 lb 9.6 oz (89.2 kg), last menstrual period 07/03/2017, SpO2 98 %, unknown if currently breastfeeding. Maternal Exam:  Uterine Assessment: Contraction frequency is rare.   Abdomen: Patient reports no abdominal tenderness. Estimated fetal weight is 5/6 lbs.   Fetal presentation:  vertex  Introitus: Normal vulva. Normal vagina.  Pelvis: adequate for delivery.   Cervix: Cervix evaluated by digital exam.     Fetal Exam Fetal Monitor Review: Mode: ultrasound.   Variability: moderate (6-25 bpm).   Pattern: accelerations present and no decelerations.    Fetal State Assessment: Category I - tracings are normal.     Physical Exam  Nursing note and vitals reviewed.   Prenatal labs: ABO, Rh: --/--/B POS, B POS Performed at [facility], [address] [place], [place] [address]  [id number](07/22 0045) Antibody: NEG (07/22 0045) Rubella: Immune (01/18 0000) RPR: Nonreactive (01/18 0000)  HBsAg: Negative (01/18 0000)  HIV: Non-reactive (01/18 0000)  GBS: Positive (07/01 0000)   Assessment/Plan: IUP @ 37+1 Umbilical Varix GBS Positive Routine [facility] orders Planning Epidural ABX for GBS pos Cervical Ripening with Cytotec and place foley bulb when able Anticipate vaginal Delivery  Simmie Garin A Luvena Wentling CNM 04/09/2018, 5:32 AM

## 2018-04-09 NOTE — Anesthesia Pain Management Evaluation Note (Signed)
  [facility] Pain Management Visit Note  Patient: Pamela Jefferson, 22 y.o., female  "Hello I am a [facility] of the anesthesia team at [facility]. We have an anesthesia team available at all times to provide care throughout the hospital, including epidural management and anesthesia for C-section. I don't know your plan for the delivery whether it a natural birth, water birth, IV sedation, nitrous supplementation, doula or epidural, but we want to meet your pain goals."   1.Was your pain managed to your expectations on prior hospitalizations?   Yes   2.What is your expectation for pain management during this hospitalization?     Epidural  3.How can we help you reach that goal? epidural  Record the patient's initial score and the patient's pain goal.   Pain: 3  Pain Goal: 7 [facility] wants you to be able to say your pain was always managed very well.  Pamela Jefferson 04/09/2018

## 2018-04-09 NOTE — Progress Notes (Addendum)
Labor Progress Note  Subjective: Pamela Jefferson, 22 y.o., G3P1011, with an IUP @ 4629w1d, presented for induction of labor due to umbilical varix 1.4cm. Pt found in bathroom, ambulating well, pt then was resting in bed in flat position. Pt endorses starting to feel contractions after second Cytotec was placed at 0500 this morning, pt able to breathing talk and walk through contractions, maternal perception of contractions are mild. RN reported pt was sleeping all night.  I discussed with patient risks, benefits and alternatives of labor induction including higher risk of cesarean delivery compared to spontaneous labor.  We discussed risks of induction agents including effects on fetal heart beat, contraction pattern and need for close monitoring.  Patient expressed understanding of all this and desired to proceed with the induction. Risks and benefits of induction were reviewed, including failure of method, prolonged labor, need for further intervention, risk of cesarean.  Patient verbalized understanding and denies any further questions at this time. Pt  wish to proceed with induction process. Discussed induction options of Cytotec, foley bulb, AROM, and pitocin were reviewed as well as risks and benefits with use of each discussed.  Patient Active Problem List   Diagnosis Date Noted  . 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 Abdominal pain 03/11/2018  . Round ligament pain 03/11/2018  . Preterm labor 03/05/2018  . Depression   . Suicidal ideation   . Suicidal ideations   . 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 MDD (major depressive disorder), single episode, severe (HCC) 01/20/2016  .      Objective: BP 102/65   Pulse 80   Temp 98.3 F (36.8 C) (Oral)   Resp 16   Ht 5\' 7"  (1.702 m)   Wt 89.2 kg (196 lb 9.6 oz)   LMP 07/03/2017 (Approximate)   SpO2 98%   BMI 30.79 kg/m  No  intake/output data recorded. No intake/output data recorded.  Physical Exam  Constitutional: She is oriented to person, place, and time and well-developed, well-nourished, and in no distress.  HENT:  Head: Normocephalic and atraumatic.  Eyes: Pupils are equal, round, and reactive to light. Conjunctivae are normal.  Neck: Normal range of motion. Neck supple.  Cardiovascular: Normal rate, regular rhythm and normal heart sounds.  Pulmonary/Chest: Effort normal and breath sounds normal.  Abdominal: Soft. Bowel sounds are normal.  Genitourinary: Uterus normal.  Genitourinary Comments: Uterus: gravida equal to dates. Adequate for vaginal delivery  Plevic: proven to 6 pound infant Leopold's: Vertex and verified with fetoscope, EFW 7pounds.   Musculoskeletal: Normal range of motion.  Neurological: She is alert and oriented to person, place, and time. She has normal reflexes. Gait normal.  Skin: Skin is warm.  Psychiatric: Affect normal.  Nursing note and vitals reviewed.   NST: FHR baseline 135 bpm, Variability: moderate, Accelerations:present, Decelerations:  Absent= Cat 1/Reactive CTX:  regular, every 2-3 minutes, lasting 40-80 secodns Uterus gravid, soft non tender, mild to palpate with contractions.  SVE:  Dilation: Closed Effacement (%): Thick Station: Ballotable Exam by:: Eaton CorporationJade Ravin Denardo CNM   Pitocin at (Not started yet) mUn/min  Assessment:  Pamela Jefferson, 22 y.o., [phone], with an IUP @ 8129w1d, presented for induction of labor due to umbilical varix 1.4cm. Pt has H/O depression.   Patient Active Problem List   Diagnosis Date Noted  . 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 Abdominal pain 03/11/2018  . Round  ligament pain 03/11/2018  . Preterm labor 03/05/2018  . Depression   . Suicidal ideation   . Suicidal ideations   . 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 MDD (major  depressive disorder), single episode, severe (HCC) 01/20/2016   NICHD: Category 1  Membranes:  Intact no s/s of infection  Induction:    Cytotec x 1 @ 0100, 2 @ 0500  Foley Bulb: Unable to insert due to closed exam  Pitocin - Have not started yet.   Pain management:               Epidural placement: Plans for placement PRN  GBS Positive  Abx: Penicillin X 3 doses, last at 0900  Plan: Continue labor plan Continuous monitoring May eat light breakfast, then clear liquids after.  Ambulate, upright and on birthing ball to facility fetal decent.  Frequent position changes to facilitate fetal rotation and descent. Will reassess with cervical exam at 1000 or earlier if necessary  Plan to place foley bulb or another cyctoec, depending on vaginal exam at 1000  Once pt cervical exam is a bishop score of 8, plan to start pitocin 2X2 per protocol Anticipate labor progression and vaginal delivery.   Md Dillard aware of plan and verbalized agreement.   Dale [place], NP-C, CNM, MSN 04/09/2018. 8:00 AM

## 2018-04-09 NOTE — Progress Notes (Signed)
Labor Progress Note  Subjective: Pamela CohoNatalie Jefferson, 22 y.o., G3P1011, with an IUP @ 152w1d, presented for induction of labor due to umbilical varix 1.4cm. Pt found resting bed resting quietly with mother at bedside. Pt denies complaints.   Patient Active Problem List   Diagnosis Date Noted  . 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 Abdominal pain 03/11/2018  . Round ligament pain 03/11/2018  . Preterm labor 03/05/2018  . Depression   . Suicidal ideation   . Suicidal ideations   . 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 MDD (major depressive disorder), single episode, severe (HCC) 01/20/2016      Objective: BP (!) 100/54   Pulse 71   Temp 97.9 F (36.6 C) (Oral)   Resp 18   Ht 5\' 7"  (1.702 m)   Wt 89.2 kg (196 lb 9.6 oz)   LMP 07/03/2017 (Approximate)   SpO2 100%   BMI 30.79 kg/m  No intake/output data recorded. No intake/output data recorded.   NST: FHR baseline 130 bpm, Variability: moderate, Accelerations:present, Decelerations:  Absent= Cat 1/Reactive CTX:  regular, every 2-5 minutes, lasting 40-80 secodns Uterus gravid, soft non tender, mild to palpate with contractions.  SVE:  Dilation: 4.5 Effacement (%): 60% Station: -2 Exam by:: Mitsuye Schrodt M  Foley Bulb out at 1600 Pitocin at 5 mUn/min AROM: Clear, moderate amount.   Assessment:  Pamela Jefferson, 22 y.o., [phone], with an IUP @ 8152w1d, presented for induction of labor due to umbilical varix 1.4cm. Pt has H/O depression. Pt stable in room comfortable with epirudal , foley fell out at 1600, pt was AROM, clear fluid, maternal and fetus tolerated procedure well, no decels noted.   Patient Active Problem List   Diagnosis Date Noted  . 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 Abdominal pain 03/11/2018  . Round ligament pain 03/11/2018  . Preterm labor  03/05/2018  . Depression   . Suicidal ideation   . Suicidal ideations   . 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 MDD (major depressive disorder), single episode, severe (HCC) 01/20/2016   NICHD: Category 1  Membranes:  AROM: Clear at 1630 on 07/22, no s/s of infection  Induction:    Cytotec x 1 @ 0100, 2 @ 0500  Foley Bulb: Placed at 1200, out at 1600  Pitocin - 5  Pain management:               Epidural placement: Placed at 1145 on 07/22  GBS Positive  Abx: Penicillin X 4 doses, last at 1300  Plan: Continue labor plan Continuous monitoring Clear liquids Position upright and on peanut ball to facility fetal decent.  Frequent position changes to facilitate fetal rotation and descent. Will reassess with cervical exam at 2000 or earlier if necessary Continuous pitocin at 1x1, now at 5 Anticipate labor progression and vaginal delivery.   Md Dillard updated.   Pamela DurhamJade Ellis Mehaffey, NP-C, CNM, MSN 04/09/2018. 8:00 AM

## 2018-04-09 NOTE — Anesthesia Procedure Notes (Signed)
Epidural Patient location during procedure: OB Start time: 04/09/2018 11:33 AM End time: 04/09/2018 11:57 AM  Staffing Anesthesiologist: Jairo BenJackson, Tresten Pantoja, MD Performed: anesthesiologist   Preanesthetic Checklist Completed: patient identified, surgical consent, pre-op evaluation, timeout performed, IV checked, risks and benefits discussed and monitors and equipment checked  Epidural Patient position: sitting Prep: ChloraPrep and site prepped and draped Patient monitoring: blood pressure, continuous pulse ox and heart rate Approach: midline Location: L3-L4 Injection technique: LOR air  Needle:  Needle type: Tuohy  Needle gauge: 17 G Needle length: 9 cm Needle insertion depth: 4 cm Catheter type: closed end flexible Catheter size: 19 Gauge Catheter at skin depth: 9 cm Test dose: negative (1% lidocaine)  Assessment Events: blood not aspirated, injection not painful, no injection resistance, negative IV test and no paresthesia  Additional Notes Pt identified in Labor room.  Monitors applied. Working IV access confirmed. Sterile prep, drape lumbar spine.  1% lido local L 3,4.  #17ga Touhy LOR air at 4 cm L 3,4, cath in easily to 9 cm skin. Test dose OK, cath dosed and infusion begun.  Patient asymptomatic, VSS, no heme aspirated, tolerated well.  Sandford Craze Gerrit Rafalski, MDReason for block:procedure for pain

## 2018-04-10 LAB — CBC
HEMATOCRIT: 29.3 % — AB (ref 36.0–46.0)
HEMOGLOBIN: 9.9 g/dL — AB (ref 12.0–15.0)
MCH: 30.6 pg (ref 26.0–34.0)
MCHC: 33.8 g/dL (ref 30.0–36.0)
MCV: 90.4 fL (ref 78.0–100.0)
Platelets: 255 10*3/uL (ref 150–400)
RBC: 3.24 MIL/uL — ABNORMAL LOW (ref 3.87–5.11)
RDW: 14.9 % (ref 11.5–15.5)
WBC: 13.4 10*3/uL — AB (ref 4.0–10.5)

## 2018-04-10 NOTE — Lactation Note (Addendum)
This note was copied from a baby's chart. Lactation Consultation Note  Patient Name: [name] JYNWG'NToday's Date: 04/10/2018   Baby 17 hours old.  Mother states she breastfed her first child for 1 week but it was too much work so she stopped. Offered to demonstrate hand expression and mother declined stating she "knows how". Mother states she would like to formula feed for now and eventually pump. Offered to set mother up with DEBP but she states she will start pumping later. Left lactation brochure with phone number and resources.    Mother called and LC set up DEBP. Room full of visitors. Suggest she pump q 3 hours.   Demonstrated how to spoon feed.  Discussed cleaning.  Pacifier use not recommended at this time. Provided education regarding feeding cues.       Maternal Data    Feeding Feeding Type: Bottle Fed - Formula Nipple Type: Regular  LATCH Score                   Interventions    Lactation Tools Discussed/Used     Consult Status      Hardie PulleyBerkelhammer, Berlene Dixson Boschen 04/10/2018, 1:57 PM

## 2018-04-10 NOTE — Plan of Care (Signed)
Progressing appropriately. Encouraged to call for assistance as needed. Safety information and room orientation complete. 

## 2018-04-10 NOTE — Progress Notes (Signed)
Reported off to Dr Estanislado Pandyivard and Kathalene FramesEllis Greer, cnm.

## 2018-04-10 NOTE — Anesthesia Postprocedure Evaluation (Signed)
Anesthesia Post Note  Patient: Pamela Jefferson  Procedure(s) Performed: AN AD HOC LABOR EPIDURAL     Patient location during evaluation: Mother Baby Anesthesia Type: Epidural Level of consciousness: awake Pain management: satisfactory to patient Vital Signs Assessment: post-procedure vital signs reviewed and stable Respiratory status: spontaneous breathing Cardiovascular status: stable Anesthetic complications: no    Last Vitals:  Vitals:   04/09/18 2215 04/09/18 2327  BP: 108/60 111/67  Pulse: 75 75  Resp: 18 18  Temp: 36.9 C 36.8 C  SpO2:      Last Pain:  Vitals:   04/10/18 0542  TempSrc:   PainSc: 10-Worst pain ever   Pain Goal:                 [name],[name]

## 2018-04-10 NOTE — Progress Notes (Signed)
Post Partum Day 1 Subjective: no complaints, up ad lib, voiding and tolerating PO. Patient with history of anxiety and depression. States her mood has been good and denies any symptoms at this time. Patient affirmed she would let us know if she begins to struggle with these conditions postpartum.   Objective: Vitals:   04/09/18 2131 04/09/18 2147 04/09/18 2215 04/09/18 2327  BP: 103/61 105/64 108/60 111/67  Pulse: 83 83 75 75  Resp: 18 18 18 18   Temp:   98.4 F (36.9 C) 98.2 F (36.8 C)  TempSrc:   Oral Oral  SpO2:      Weight:      Height:        Physical Exam:  General: alert and cooperative Lochia: appropriate Uterine Fundus: firm Incision: n/a DVT Evaluation: No evidence of DVT seen on physical exam. Negative Homan's sign. No cords or calf tenderness. No significant calf/ankle edema.  Recent Labs    04/09/18 0045 04/10/18 0814  HGB 9.6* 9.9*  HCT 29.8* 29.3*    Assessment/Plan: Plan for discharge tomorrow and Breastfeeding/bottle feeding. Plans outpatient circumcision.    LOS: 1 day   Pamela Jefferson Pamela Jefferson 04/10/2018, 8:50 AM

## 2018-04-11 MED ORDER — IBUPROFEN 600 MG PO TABS: 600.0000 mg | ORAL_TABLET | Freq: Four times a day (QID) | ORAL | 0 refills | Status: DC | PRN

## 2018-04-11 NOTE — Progress Notes (Signed)
Pt  Refuses to complete edinburgh. Initally she refused assessment but then allows me to assess she and infant. Pt demands discharge in 2 hours. I notified Dr Su Hiltoberts, Social worker and charge nurse, who will inform the pediatrician.

## 2018-04-11 NOTE — Progress Notes (Signed)
CSW received consult for hx of Anxiety and Depression.  CSW met with MOB in her first floor room/137 to offer support and complete assessment.   MOB was alone when CSW arrived.  She seemed uninterested in CSW's visit initially, but soon became receptive to CSW intervention and talking about how she is feeling about having her second child.  She states she is ready to go home and be in her own space again.  She states she feels staff has taken great care of her, but feels overwhelmed by having so many people in and out of her room.  She reports feeling well emotionally and physically, although she feels this delivery was more painful than her first.   MOB told CSW that she was adopted when she was 17.  She does not have a relationship with her biological parents, and grew up in the foster care system.  She states "it could have been a better experience, but it wasn't bad."  She states her adoptive mother is a good support and is currently caring for her 3 year old son "Jr" while she is in the hospital with baby.  She reports feeling happy about having another baby and having no expectations of his father stepping up and being supportive.  She states she raises her first child on her own and can do it for this baby as well.  CSW is unsure whether her children have the same father, as it was unclear, but CSW thinks there are two different fathers.  MOB states she has everything she needs for infant at home and was aware of SIDS precautions. MOB reports having a hx of anxiety and depression approximately 2 years ago during an "overwhelming time in my life."  She states she went to [facility] and was admitted for a period of time, with outpatient follow up arranged for her when she was discharged.  She states she did not take psychotropic medication then and does not feel it is needed now either.  She states she had counseling at [facility], but felt it was too big for her.  She wanted a smaller,  "private" office so she started seeing a therapist on [address], but cannot recall the name.  She states she stopped therapy when that therapist left.  She does not feel counseling is needed at this time, but states she found it beneficial and may want to return in the future.  She was open to resources.  MOB was able to identify positive coping mechanisms, states she feels comfortable reaching out to her doctor if she has concerns at any time, and reports that she always tries to laugh and not take things too seriously.   CSW provided education regarding the baby blues period vs. perinatal mood disorders, discussed treatment and gave resources for mental health follow up if concerns arise.  CSW recommends self-evaluation during the postpartum time period using the New Mom Checklist from Postpartum Progress, as well as the Edinburgh Postnatal Depression Scale and encouraged MOB to contact a medical professional if symptoms are noted at any time.  CSW also informed MOB about support groups held at [facility].  CSW provided review of Sudden Infant Death Syndrome (SIDS) precautions.   CSW identifies no further need for intervention and no barriers to discharge at this time.  

## 2018-04-11 NOTE — Discharge Summary (Signed)
OB Discharge Summary     Patient Name: Pamela Jefferson DOB: Dec 22, 1995 MRN: [id number]  Date of admission: 04/09/2018 Delivering MD: Dale [place]   Date of discharge: 04/11/2018  Admitting diagnosis: INDUCTION Intrauterine pregnancy: [redacted]w[redacted]d     Secondary diagnosis:  Active Problems:   Pregnancy complicated by umbilical cord varix, antepartum, not applicable or unspecified fetus  Additional problems: Major depressive disorder, history of suicidal ideations, asymptomatic anemia      Discharge diagnosis: Term Pregnancy Delivered                                                                                                Post partum procedures:n/a  Augmentation: AROM, Pitocin, Cytotec and Foley Balloon  Complications: None  Hospital course:  Induction of Labor With Vaginal Delivery   22 y.o. yo [id number] at [redacted]w[redacted]d was admitted to the hospital 04/09/2018 for induction of labor.  Indication for induction: umbilical cord varix.  Patient had an uncomplicated labor course as follows: Membrane Rupture Time/Date: 4:15 PM ,04/09/2018   Intrapartum Procedures: Episiotomy: None [1]                                         Lacerations:  None [1]  Patient had delivery of a Viable infant.  Information for the patient's newborn:  Shanzay, Hepworth [[id number]]  Delivery Method: Vaginal, Spontaneous(Filed from Delivery Summary)   04/09/2018  Details of delivery can be found in separate delivery note.  Patient had a routine postpartum course. Patient is discharged home 04/11/18.  Patient verbalizes her mood is good and she is not experiencing any depression or anxiety symptoms at this time. Information provided on baby blues and postpartum depression. Verbalizes she will call office or present to [facility] if she becomes symptomatic.   Physical exam  Vitals:   04/10/18 1415 04/10/18 1520 04/10/18 2358 04/11/18 0500  BP: 117/72 126/70 118/70 118/77  Pulse: 81 78 75 67  Resp: 16 18 18    Temp: 98.5 F  (36.9 C) (!) 97.5 F (36.4 C) 98.7 F (37.1 C) 98.3 F (36.8 C)  TempSrc: Oral  Oral Oral  SpO2: 100%     Weight:      Height:       General: alert, cooperative and no distress Lochia: appropriate Uterine Fundus: firm Incision: N/A DVT Evaluation: No evidence of DVT seen on physical exam. Negative Homan's sign. No cords or calf tenderness. No significant calf/ankle edema. Labs: Lab Results  Component Value Date   WBC 13.4 (H) 04/10/2018   HGB 9.9 (L) 04/10/2018   HCT 29.3 (L) 04/10/2018   MCV 90.4 04/10/2018   PLT 255 04/10/2018   CMP Latest Ref Rng & Units 01/23/2018  Glucose 65 - 99 mg/dL 93  BUN 6 - 20 mg/dL 9  Creatinine 1.30 - 8.65 mg/dL 7.84  Sodium 696 - 295 mmol/L 136  Potassium 3.5 - 5.1 mmol/L 3.4(L)  Chloride 101 - 111 mmol/L 106  CO2 22 - 32 mmol/L 21(L)  Calcium 8.9 -  10.3 mg/dL 1.6(X8.4(L)  Total Protein 6.5 - 8.1 g/dL 6.9  Total Bilirubin 0.3 - 1.2 mg/dL 0.9(U0.2(L)  Alkaline Phos 38 - 126 U/L 73  AST 15 - 41 U/L 13(L)  ALT 14 - 54 U/L 11(L)    Discharge instruction: per After Visit Summary and "Baby and Me Booklet".  After visit meds:  Allergies as of 04/11/2018      Reactions   Shellfish Allergy Anaphylaxis      Medication List    TAKE these medications   ferrous sulfate 325 (65 FE) MG tablet Take 1 tablet (325 mg total) by mouth daily.   ibuprofen 600 MG tablet Commonly known as:  ADVIL,MOTRIN Take 1 tablet (600 mg total) by mouth every 6 (six) hours as needed for mild pain or moderate pain.   prenatal multivitamin Tabs tablet Take 1 tablet by mouth daily at 12 noon.       Diet: routine diet, continue iron supplementation for asymptomatic anemia   Activity: Advance as tolerated. Pelvic rest for 6 weeks.   Outpatient follow up:6 weeks Follow up Appt:No future appointments. Follow up Visit:No follow-ups on file.  Postpartum contraception: Tubal Ligation, planned postpartum   Newborn Data: Live born female  Birth Weight: 6 lb 3.8 oz (2829  g) APGAR: 9, 10  Newborn Delivery   Birth date/time:  04/09/2018 20:10:00 Delivery type:  Vaginal, Spontaneous     Baby Feeding: Bottle and Breast Disposition:home with mother   04/11/2018 Janeece RiggersEllis K Breslin Hemann, CNM

## 2018-04-11 NOTE — Discharge Instructions (Signed)
Postpartum Care After Vaginal Delivery ° °The period of time right after you deliver your newborn is called the postpartum period. °What kind of medical care will I receive? °· You may continue to receive fluids and medicines through an IV tube inserted into one of your veins. °· If an incision was made near your vagina (episiotomy) or if you had some vaginal tearing during delivery, cold compresses may be placed on your episiotomy or your tear. This helps to reduce pain and swelling. °· You may be given a squirt bottle to use when you go to the bathroom. You may use this until you are comfortable wiping as usual. To use the squirt bottle, follow these steps: °? Before you urinate, fill the squirt bottle with warm water. Do not use hot water. °? After you urinate, while you are sitting on the toilet, use the squirt bottle to rinse the area around your urethra and vaginal opening. This rinses away any urine and blood. °? You may do this instead of wiping. As you start healing, you may use the squirt bottle before wiping yourself. Make sure to wipe gently. °? Fill the squirt bottle with clean water every time you use the bathroom. °· You will be given sanitary pads to wear. °How can I expect to feel? °· You may not feel the need to urinate for several hours after delivery. °· You will have some soreness and pain in your abdomen and vagina. °· If you are breastfeeding, you may have uterine contractions every time you breastfeed for up to several weeks postpartum. Uterine contractions help your uterus return to its normal size. °· It is normal to have vaginal bleeding (lochia) after delivery. The amount and appearance of lochia is often similar to a menstrual period in the first week after delivery. It will gradually decrease over the next few weeks to a dry, yellow-brown discharge. For most women, lochia stops completely by 6-8 weeks after delivery. Vaginal bleeding can vary from woman to woman. °· Within the first few  days after delivery, you may have breast engorgement. This is when your breasts feel heavy, full, and uncomfortable. Your breasts may also throb and feel hard, tightly stretched, warm, and tender. After this occurs, you may have milk leaking from your breasts. Your health care provider can help you relieve discomfort due to breast engorgement. Breast engorgement should go away within a few days. °· You may feel more sad or worried than normal due to hormonal changes after delivery. These feelings should not last more than a few days. If these feelings do not go away after several days, speak with your health care provider. °How should I care for myself? °· Tell your health care provider if you have pain or discomfort. °· Drink enough water to keep your urine clear or pale yellow. °· Wash your hands thoroughly with soap and water for at least 20 seconds after changing your sanitary pads, after using the toilet, and before holding or feeding your baby. °· If you are not breastfeeding, avoid touching your breasts a lot. Doing this can make your breasts produce more milk. °· If you become weak or lightheaded, or you feel like you might faint, ask for help before: °? Getting out of bed. °? Showering. °· Change your sanitary pads frequently. Watch for any changes in your flow, such as a sudden increase in volume, a change in color, the passing of large blood clots. If you pass a blood clot from your vagina,   save it to show to your health care provider. Do not flush blood clots down the toilet without having your health care provider look at them.  Make sure that all your vaccinations are up to date. This can help protect you and your baby from getting certain diseases. You may need to have immunizations done before you leave the hospital.  If desired, talk with your health care provider about methods of family planning or birth control (contraception). How can I start bonding with my baby? Spending as much time as  possible with your baby is very important. During this time, you and your baby can get to know each other and develop a bond. Having your baby stay with you in your room (rooming in) can give you time to get to know your baby. Rooming in can also help you become comfortable caring for your baby. Breastfeeding can also help you bond with your baby. How can I plan for returning home with my baby?  Make sure that you have a car seat installed in your vehicle. ? Your car seat should be checked by a certified car seat installer to make sure that it is installed safely. ? Make sure that your baby fits into the car seat safely.  Ask your health care provider any questions you have about caring for yourself or your baby. Make sure that you are able to contact your health care provider with any questions after leaving the hospital. This information is not intended to replace advice given to you by your health care provider. Make sure you discuss any questions you have with your health care provider. Document Released: 07/03/2007 Document Revised: 02/08/2016 Document Reviewed: 08/10/2015 Elsevier Interactive Patient Education  2018 [employer].   Postpartum Depression and Baby Blues The postpartum period begins right after the birth of a baby. During this time, there is often a great amount of joy and excitement. It is also a time of many changes in the life of the parents. Regardless of how many times a mother gives birth, each child brings new challenges and dynamics to the family. It is not unusual to have feelings of excitement along with confusing shifts in moods, emotions, and thoughts. All mothers are at risk of developing postpartum depression or the "baby blues." These mood changes can occur right after giving birth, or they may occur many months after giving birth. The baby blues or postpartum depression can be mild or severe. Additionally, postpartum depression can go away rather quickly, or it can  be a long-term condition. What are the causes? Raised hormone levels and the rapid drop in those levels are thought to be a main cause of postpartum depression and the baby blues. A number of hormones change during and after pregnancy. Estrogen and progesterone usually decrease right after the delivery of your baby. The levels of thyroid hormone and various cortisol steroids also rapidly drop. Other factors that play a role in these mood changes include major life events and genetics. What increases the risk? If you have any of the following risks for the baby blues or postpartum depression, know what symptoms to watch out for during the postpartum period. Risk factors that may increase the likelihood of getting the baby blues or postpartum depression include:  Having a personal or family history of depression.  Having depression while being pregnant.  Having premenstrual mood issues or mood issues related to oral contraceptives.  Having a lot of life stress.  Having marital conflict.  Lacking  a social support network.  Having a baby with special needs.  Having health problems, such as diabetes.  What are the signs or symptoms? Symptoms of baby blues include:  Brief changes in mood, such as going from extreme happiness to sadness.  Decreased concentration.  Difficulty sleeping.  Crying spells, tearfulness.  Irritability.  Anxiety.  Symptoms of postpartum depression typically begin within the first month after giving birth. These symptoms include:  Difficulty sleeping or excessive sleepiness.  Marked weight loss.  Agitation.  Feelings of worthlessness.  Lack of interest in activity or food.  Postpartum psychosis is a very serious condition and can be dangerous. Fortunately, it is rare. Displaying any of the following symptoms is cause for immediate medical attention. Symptoms of postpartum psychosis include:  Hallucinations and delusions.  Bizarre or disorganized  behavior.  Confusion or disorientation.  How is this diagnosed? A diagnosis is made by an evaluation of your symptoms. There are no medical or lab tests that lead to a diagnosis, but there are various questionnaires that a health care provider may use to identify those with the baby blues, postpartum depression, or psychosis. Often, a screening tool called the [place] Postnatal Depression Scale is used to diagnose depression in the postpartum period. How is this treated? The baby blues usually goes away on its own in 1-2 weeks. Social support is often all that is needed. You will be encouraged to get adequate sleep and rest. Occasionally, you may be given medicines to help you sleep. Postpartum depression requires treatment because it can last several months or longer if it is not treated. Treatment may include individual or group therapy, medicine, or both to address any social, physiological, and psychological factors that may play a role in the depression. Regular exercise, a healthy diet, rest, and social support may also be strongly recommended. Postpartum psychosis is more serious and needs treatment right away. Hospitalization is often needed. Follow these instructions at home:  Get as much rest as you can. Nap when the baby sleeps.  Exercise regularly. Some women find yoga and walking to be beneficial.  Eat a balanced and nourishing diet.  Do little things that you enjoy. Have a cup of tea, take a bubble bath, read your favorite magazine, or listen to your favorite music.  Avoid alcohol.  Ask for help with household chores, cooking, grocery shopping, or running errands as needed. Do not try to do everything.  Talk to people close to you about how you are feeling. Get support from your partner, family members, friends, or other new moms.  Try to stay positive in how you think. Think about the things you are grateful for.  Do not spend a lot of time alone.  Only take  over-the-counter or prescription medicine as directed by your health care provider.  Keep all your postpartum appointments.  Let your health care provider know if you have any concerns. Contact a health care provider if: You are having a reaction to or problems with your medicine. Get help right away if:  You have suicidal feelings.  You think you may harm the baby or someone else. This information is not intended to replace advice given to you by your health care provider. Make sure you discuss any questions you have with your health care provider. Document Released: 06/09/2004 Document Revised: 02/11/2016 Document Reviewed: 06/17/2013 Elsevier Interactive Patient Education  2017 Else[employer] Inc.   Laparoscopic Tubal Ligation Laparoscopic tubal ligation is a procedure to close the fallopian tubes. This  is done so that you cannot get pregnant. When the fallopian tubes are closed, the eggs that your ovaries release cannot enter the uterus, and sperm cannot reach the released eggs. A laparoscopic tubal ligation is sometimes called "getting your tubes tied." You should not have this procedure if you want to get pregnant someday or if you are unsure about having more children. Tell a health care provider about:  Any allergies you have.  All medicines you are taking, including vitamins, herbs, eye drops, creams, and over-the-counter medicines.  Any problems you or family members have had with anesthetic medicines.  Any blood disorders you have.  Any surgeries you have had.  Any medical conditions you have.  Whether you are pregnant or may be pregnant.  Any past pregnancies. What are the risks? Generally, this is a safe procedure. However, problems may occur, including:  Infection.  Bleeding.  Injury to surrounding organs.  Side effects from anesthetics.  Failure of the procedure.  This procedure can increase your risk of a kind of pregnancy in which a fertilized egg  attaches to the outside of the uterus (ectopic pregnancy). What happens before the procedure?  Ask your health care provider about: ? Changing or stopping your regular medicines. This is especially important if you are taking diabetes medicines or blood thinners. ? Taking medicines such as aspirin and ibuprofen. These medicines can thin your blood. Do not take these medicines before your procedure if your health care provider instructs you not to.  Follow instructions from your health care provider about eating and drinking restrictions.  Plan to have someone take you home after the procedure.  If you go home right after the procedure, plan to have someone with you for 24 hours. What happens during the procedure?  You will be given one or more of the following: ? A medicine to help you relax (sedative). ? A medicine to numb the area (local anesthetic). ? A medicine to make you fall asleep (general anesthetic). ? A medicine that is injected into an area of your body to numb everything below the injection site (regional anesthetic).  An IV tube will be inserted into one of your veins. It will be used to give you medicines and fluids during the procedure.  Your bladder may be emptied with a small tube (catheter).  If you have been given a general anesthetic, a tube will be put down your throat to help you breathe.  Two small cuts (incisions) will be made in your lower abdomen and near your belly button.  Your abdomen will be inflated with a gas. This will let the surgeon see better and will give the surgeon room to work.  A thin, lighted tube (laparoscope) with a camera attached will be inserted into your abdomen through one of the incisions. Small instruments will be inserted through the other incision.  The fallopian tubes will be tied off, burned (cauterized), or blocked with a clip, ring, or clamp. A small portion in the center of each fallopian tube may be removed.  The gas will  be released from the abdomen.  The incisions will be closed with stitches (sutures).  A bandage (dressing) will be placed over the incisions. The procedure may vary among health care providers and hospitals. What happens after the procedure?  Your blood pressure, heart rate, breathing rate, and blood oxygen level will be monitored often until the medicines you were given have worn off.  You will be given medicine to help with  pain, nausea, and vomiting as needed. This information is not intended to replace advice given to you by your health care provider. Make sure you discuss any questions you have with your health care provider. Document Released: 12/12/2000 Document Revised: 02/11/2016 Document Reviewed: 08/16/2015 Elsevier Interactive Patient Education  2018 [employer].   Perinatal Depression When a woman feels excessive sadness, anger, or anxiety during pregnancy or during the first 12 months after she gives birth, she has a condition called perinatal depression. Depression can interfere with work, school, relationships, and other everyday activities. If it is not managed properly, it can also cause problems in the mother and her baby. Sometimes, perinatal depression is left untreated because symptoms are thought to be normal mood swings during and right after pregnancy. If you have symptoms of depression, it is important to talk with your health care provider. What are the causes? The exact cause of this condition is not known. Hormonal changes during and after pregnancy may play a role in causing perinatal depression. What increases the risk? You are more likely to develop this condition if:  You have a personal or family history of depression, anxiety, or mood disorders.  You experience a stressful life event during pregnancy, such as the death of a loved one.  You have a lot of regular life stress.  You do not have support from family members or loved ones, or you are in an  abusive relationship.  What are the signs or symptoms? Symptoms of this condition include:  Feeling sad or hopeless.  Feelings of guilt.  Feeling irritable or overwhelmed.  Changes in your appetite.  Lack of energy or motivation.  Sleep problems.  Difficulty concentrating or completing tasks.  Loss of interest in hobbies or relationships.  Headaches or stomach problems that do not go away.  How is this diagnosed? This condition is diagnosed based on a physical exam and mental evaluation. In some cases, your health care provider may use a depression screening tool. These tools include a list of questions that can help a health care provider diagnose depression. Your health care provider may refer you to a mental health expert who specializes in depression. How is this treated? This condition may be treated with:  Medicines. Your health care provider will only give you medicines that have been proven safe for pregnancy and breastfeeding.  Talk therapy with a mental health professional to help change your patterns of thinking (cognitive behavioral therapy).  Support groups.  Brain stimulation or light therapies.  Stress reduction therapies, such as mindfulness.  Follow these instructions at home: Lifestyle  Do not use any products that contain nicotine or tobacco, such as cigarettes and e-cigarettes. If you need help quitting, ask your health care provider.  Do not use alcohol when you are pregnant. After your baby is born, limit alcohol intake to no more than 1 drink a day. One drink equals 12 oz of beer, 5 oz of wine, or 1 oz of hard liquor.  Consider joining a support group for new mothers. Ask your health care provider for recommendations.  Take good care of yourself. Make sure you: ? Get plenty of sleep. If you are having trouble sleeping, talk with your health care provider. ? Eat a healthy diet. This includes plenty of fruits and vegetables, whole grains, and  lean proteins. ? Exercise regularly, as told by your health care provider. Ask your health care provider what exercises are safe for you. General instructions  Take over-the-counter and  prescription medicines only as told by your health care provider.  Talk with your partner or family members about your feelings during pregnancy. Share any concerns or anxieties that you may have.  Ask for help with tasks or chores when you need it. Ask friends and family members to provide meals, watch your children, or help with cleaning.  Keep all follow-up visits as told by your health care provider. This is important. Contact a health care provider if:  You (or people close to you) notice that you have any symptoms of depression.  You have depression and your symptoms get worse.  You experience side effects from medicines, such as nausea or sleep problems. Get help right away if:  You feel like hurting yourself, your baby, or someone else. If you ever feel like you may hurt yourself or others, or have thoughts about taking your own life, get help right away. You can go to your nearest emergency department or call:  Your local emergency services (911 in the [place]  A suicide crisis helpline, such as the National Suicide Prevention Lifeline at 989-350-80001-(916)186-5969. This is open 24 hours a day.  Summary  Perinatal depression is when a woman feels excessive sadness, anger, or anxiety during pregnancy or during the first 12 months after she gives birth.  If perinatal depression is not treated, it can lead to health problems for the mother and her baby.  This condition is treated with medicines, talk therapy, stress reduction therapies, or a combination of two or more treatments.  Talk with your partner or family members about your feelings. Do not be afraid to ask for help. This information is not intended to replace advice given to you by your health care provider. Make sure you discuss any questions  you have with your health care provider. Document Released: 11/02/2016 Document Revised: 11/02/2016 Document Reviewed: 11/02/2016 Elsevier Interactive Patient Education  [employer]2018 Else[employer] Inc.

## 2018-04-11 NOTE — Lactation Note (Signed)
This note was copied from a baby's chart. Lactation Consultation Note  Patient Name: Pamela Jefferson UJWJX'BToday's Date: 04/11/2018 Reason for consult: Follow-up assessment;Early term 37-38.6wks;Infant < 6lbs  P2 mother whose infant is now 6937 hours old.  Mother breastfed her first child for only 1 week.  She stated that she wants to pump only and bottle feed now.   I spoke with mother regarding pumping and she informed me that her mother has gotten her a DEBP.  She plans to pump at home.  Encouraged to pump every 3 hours to help increase milk supply for baby.  Discussed engorgement prevention/treatment.  Mother has no further questions/concerns and is ready for discharge.  She is supplementing with adequate amounts of formula.  Baby is resting quietly on her chest.  She will call if she has any concerns before discharge.  RN updated.   Maternal Data Formula Feeding for Exclusion: No Has patient been taught Hand Expression?: Yes Does the patient have breastfeeding experience prior to this delivery?: Yes  Feeding    LATCH Score                   Interventions    Lactation Tools Discussed/Used     Consult Status Consult Status: Complete    Pamela Jefferson 04/11/2018, 9:30 AM

## 2018-11-06 ENCOUNTER — Emergency Department (HOSPITAL_COMMUNITY)
Admission: EM | Admit: 2018-11-06 | Discharge: 2018-11-07 | Disposition: A | Discharge status: Home / Self Care | Payer: Medicaid Other | Attending: Emergency Medicine | Admitting: Emergency Medicine

## 2018-11-06 DIAGNOSIS — Z5321 Procedure and treatment not carried out due to patient leaving prior to being seen by health care provider: Secondary | ICD-10-CM | POA: Insufficient documentation

## 2018-11-06 DIAGNOSIS — K1370 Unspecified lesions of oral mucosa: Secondary | ICD-10-CM | POA: Insufficient documentation

## 2018-11-07 ENCOUNTER — Encounter (HOSPITAL_COMMUNITY): Payer: Self-pay

## 2018-11-07 ENCOUNTER — Other Ambulatory Visit: Payer: Self-pay

## 2018-11-07 NOTE — ED Triage Notes (Signed)
Pt here for lower lip pain and burning to the lips.  No medications she is currently taking.  No cuts visible.

## 2018-11-07 NOTE — ED Notes (Signed)
Called for pt twice, no reply. Assumed LWBS

## 2020-04-19 IMAGING — US US MFM OB COMP +14 WKS
1 series · 14 of 28 positions shown · non-contrast
Comparison: none

[Series 1: us mfm ob comp +14 wks · 108 acquisitions, 14 frames shown]
[im 4/108]
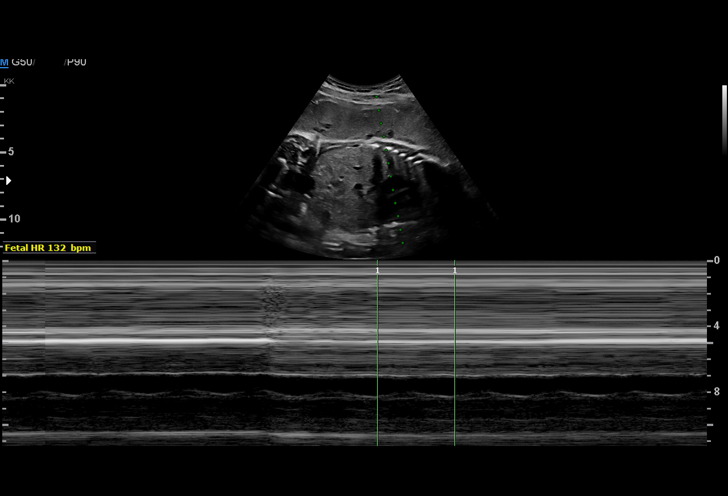
[im 12/108]
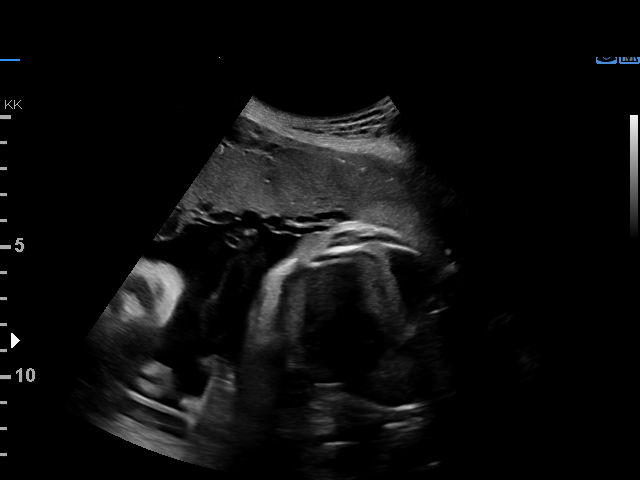
[im 20/108]
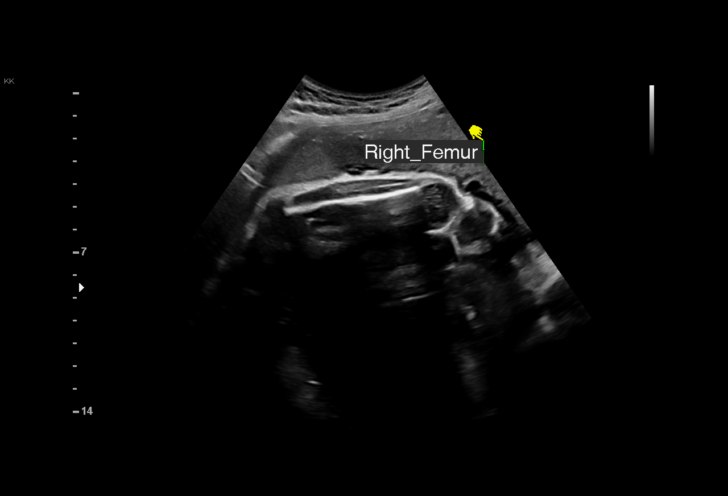
[im 28/108]
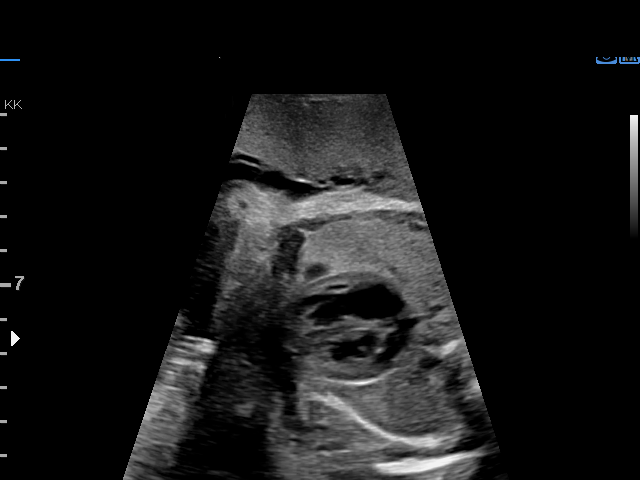
[im 36/108]
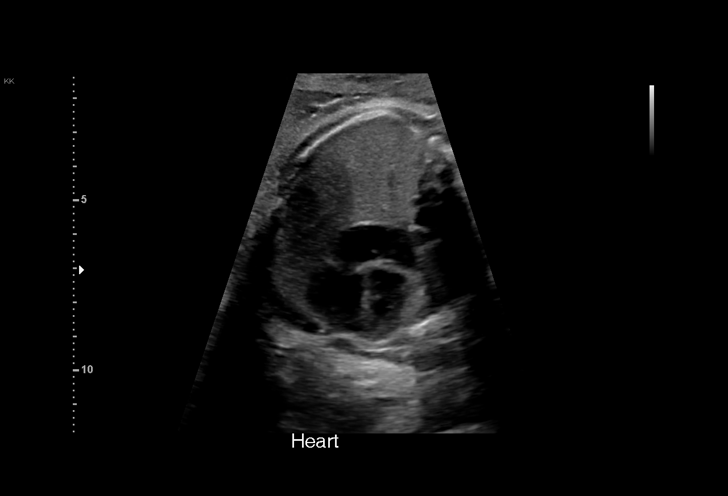
[im 44/108]
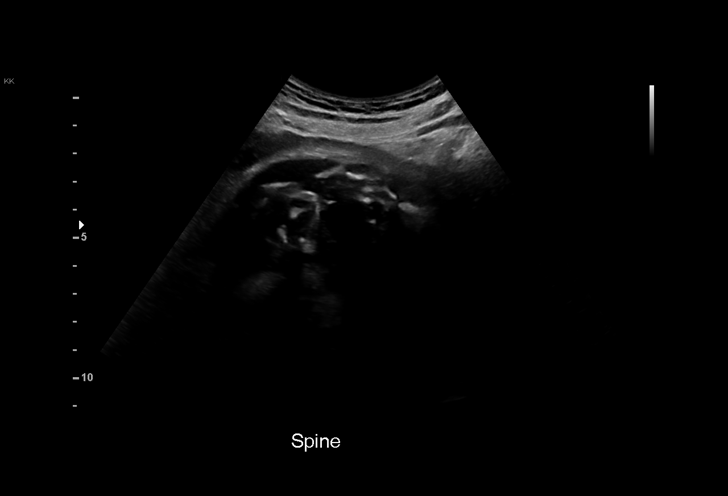
[im 52/108]
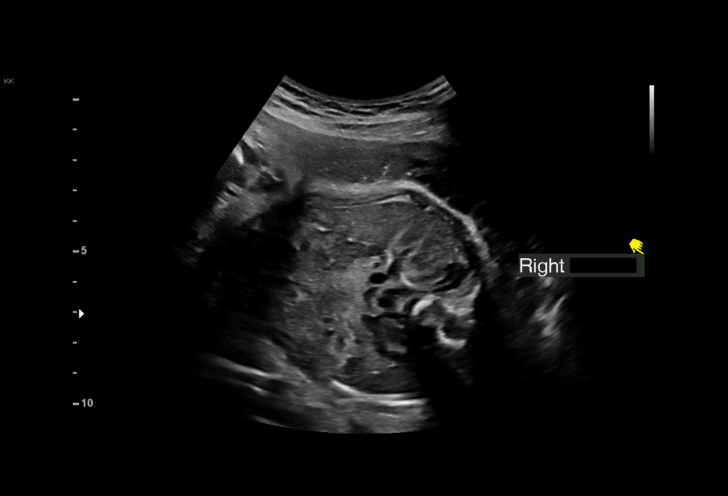
[im 60/108]
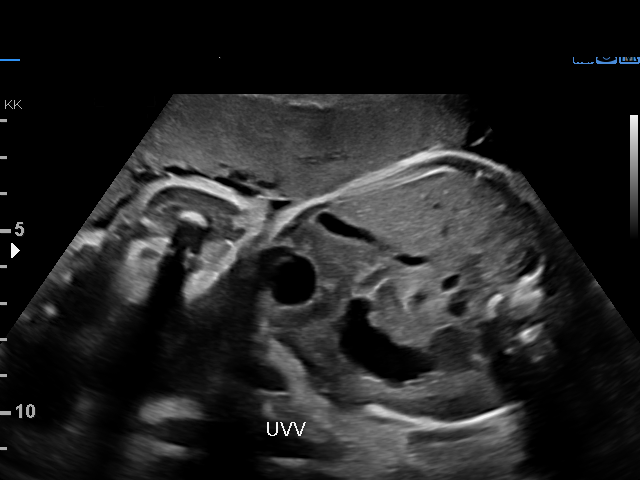
[im 68/108]
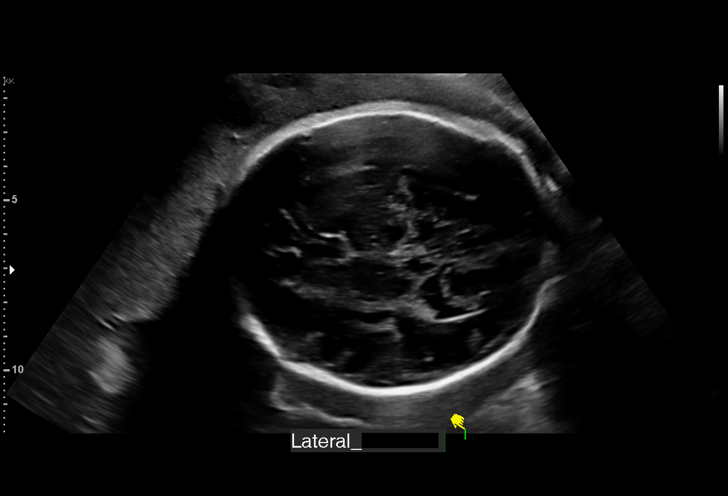
[im 76/108]
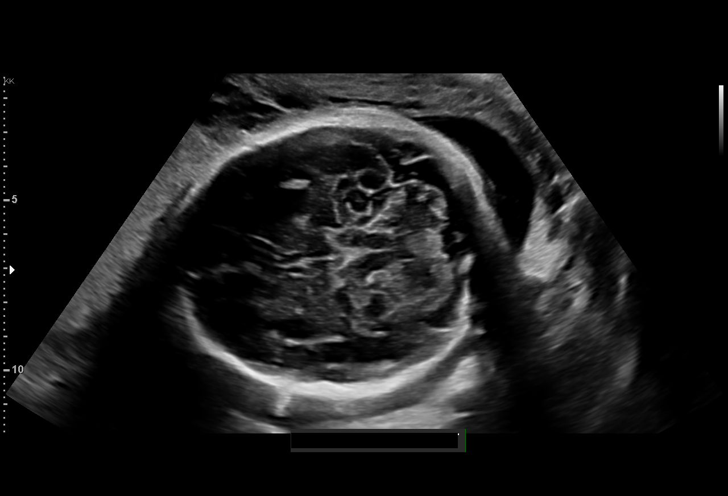
[im 84/108]
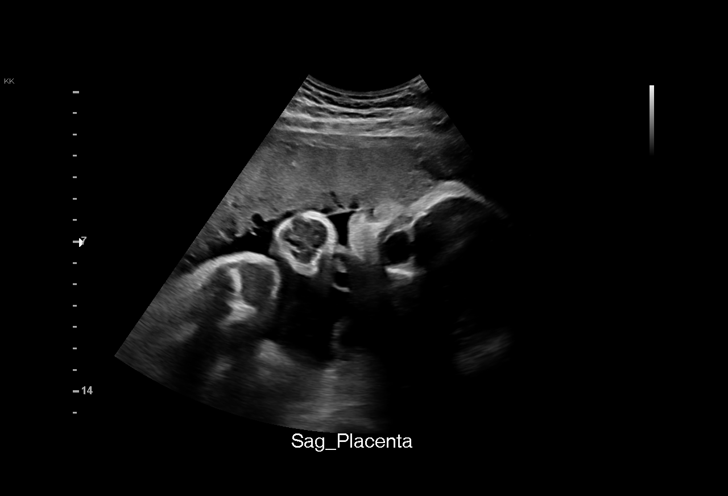
[im 92/108]
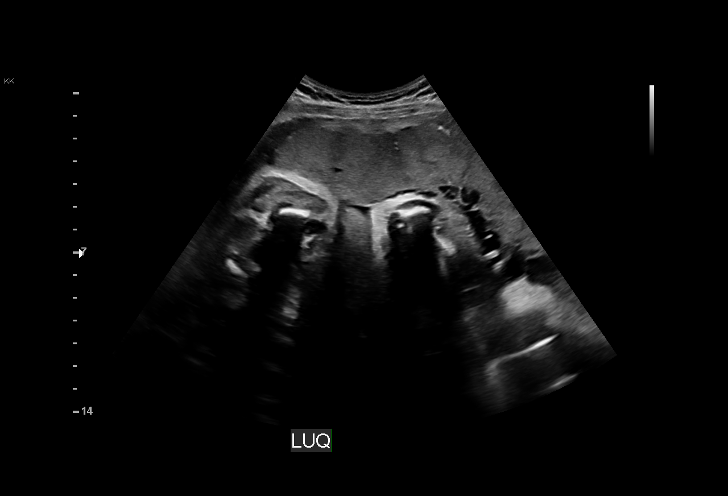
[im 100/108]
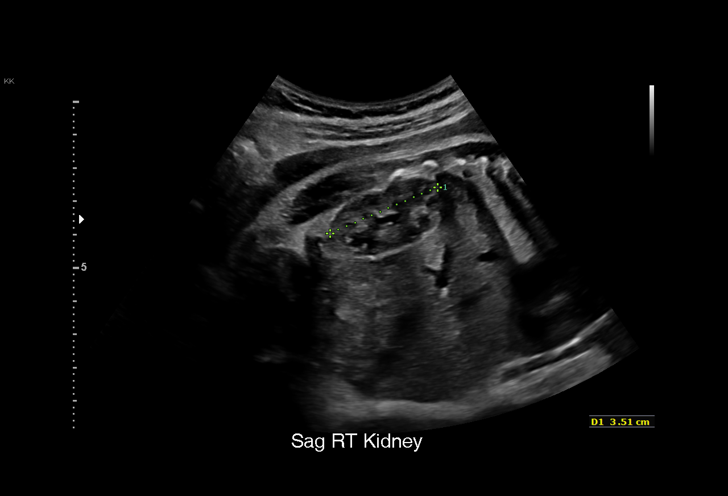
[im 108/108]
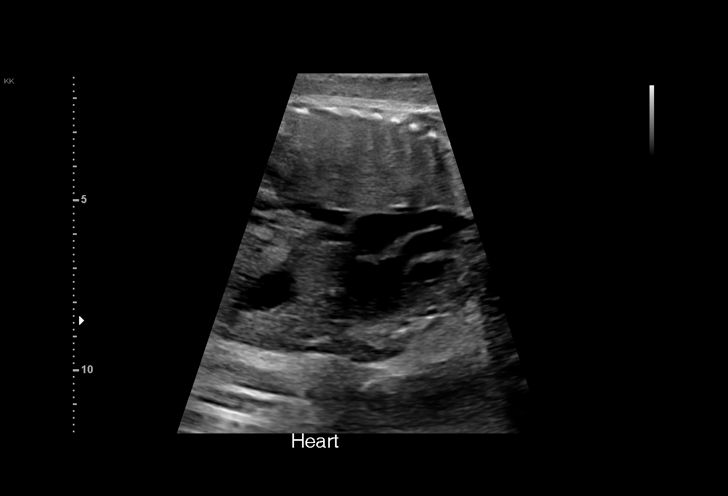

[14 of 28 positions shown; findings below may reference images not displayed]

Obstetrics &
Gynecology
[address]
[place].
MAU/Triage

1  BLAIN JUMPER              [id number]      [id number]     [id number]
Indications

32 weeks gestation of pregnancy
Abdominal pain in pregnancy
Preterm labor
OB History

Blood Type:            Height:  5'7"   Weight (lb):  171       BMI:
Gravidity:    3         Term:   1        Prem:   0        SAB:   1
TOP:          0       Ectopic:  0        Living: 1
Fetal Evaluation

Num Of Fetuses:     1
Fetal Heart         132
Rate(bpm):
Cardiac Activity:   Observed
Presentation:       Cephalic
Placenta:           Anterior, above cervical os
P. Cord Insertion:  Not well visualized

Amniotic Fluid
AFI FV:      Subjectively within normal limits

AFI Sum(cm)     %Tile       Largest Pocket(cm)
10.74           22

RUQ(cm)       RLQ(cm)                      LLQ(cm)
5.19
Biometry

BPD:      81.8  mm     G. Age:  32w 6d         64  %    CI:        77.52   %    70 - 86
FL/HC:      21.2   %    19.1 -
HC:      294.1  mm     G. Age:  32w 4d         22  %    HC/AC:      1.07        0.96 -
AC:      274.5  mm     G. Age:  31w 4d         31  %    FL/BPD:     76.3   %    71 - 87
FL:       62.4  mm     G. Age:  32w 2d         43  %    FL/AC:      22.7   %    20 - 24

Est. FW:    6765  gm      4 lb 2 oz     53  %
Gestational Age

LMP:           35w 0d        Date:  07/03/17                 EDD:   04/09/18
Clinical EDD:  32w 1d                                        EDD:   04/29/18
U/S Today:     32w 2d                                        EDD:   04/28/18
Best:          32w 1d     Det. By:  Clinical EDD             EDD:   04/29/18
Anatomy

Cranium:               Appears normal         Aortic Arch:            Not well visualized
Cavum:                 Appears normal         Ductal Arch:            Not well visualized
Ventricles:            Appears normal         Diaphragm:              Appears normal
Choroid Plexus:        Appears normal         Stomach:                Appears normal, left
sided
Cerebellum:            Appears normal         Abdomen:                Umbilical vein
varix 1.3 cm
Posterior Fossa:       Not well visualized    Abdominal Wall:         Not well visualized
Nuchal Fold:           Not applicable (>20    Cord Vessels:           Appears normal (3
wks GA)                                        vessel cord)
Face:                  Orbits appear          Kidneys:                Appear normal
normal
Lips:                  Appears normal         Bladder:                Appears normal
Thoracic:              Appears normal         Spine:                  Appears normal
Heart:                 Appears normal         Upper Extremities:      Not well visualized
(4CH, axis, and situs
RVOT:                  Not well visualized    Lower Extremities:      Appears normal
LVOT:                  Not well visualized

Other:  Complete fetal anatomic survey previously performed.
Cervix Uterus Adnexa

Cervix
Not visualized (advanced GA >63wks)
Impression

Singleton intrauterine pregnancy at 32+1 weeks with
suspected FRANSIK here for requested scan
Review of the anatomy shows no sonographic markers for
aneuploidy or structural anomalies
However, views of the posterior fossa, heart and distal
extremities should be considered suboptimal secondary to
fetal position and late gestation
Amniotic fluid volume is normal
Estimated fetal weight shows growth in the 53rd percentile
There is an umbilical vein varix noted measuring 13 mm. No
turbulent flow or filling defects are seen
Recommendations

Recommend starting UVV surveillance if patient is
undelivered. Recommend follow up scan with BPP and
doppler of the varix on [REDACTED], 03/08/2018

## 2020-09-15 ENCOUNTER — Emergency Department (HOSPITAL_COMMUNITY)
Admission: EM | Admit: 2020-09-15 | Discharge: 2020-09-16 | Disposition: A | Discharge status: Home / Self Care | Payer: Medicaid Other | Attending: Emergency Medicine | Admitting: Emergency Medicine

## 2020-09-15 ENCOUNTER — Other Ambulatory Visit: Payer: Self-pay

## 2020-09-15 DIAGNOSIS — Z5321 Procedure and treatment not carried out due to patient leaving prior to being seen by health care provider: Secondary | ICD-10-CM | POA: Insufficient documentation

## 2020-09-15 DIAGNOSIS — U071 COVID-19: Secondary | ICD-10-CM | POA: Insufficient documentation

## 2020-09-15 DIAGNOSIS — R509 Fever, unspecified: Secondary | ICD-10-CM | POA: Diagnosis present

## 2020-09-15 LAB — RESP PANEL BY RT-PCR (FLU A&B, COVID) ARPGX2
Influenza A by PCR: NEGATIVE
Influenza B by PCR: NEGATIVE
SARS Coronavirus 2 by RT PCR: POSITIVE — AB

## 2020-09-15 MED ORDER — ACETAMINOPHEN 500 MG PO TABS
1000.0000 mg | ORAL_TABLET | Freq: Once | ORAL | Status: AC
  Administered 2020-09-15: 1000 mg via ORAL
  Filled 2020-09-15: qty 2

## 2020-09-15 NOTE — ED Notes (Signed)
Pt called X3 for room with no response. Not seen outside lobby

## 2020-09-15 NOTE — ED Triage Notes (Signed)
Pt with fever, generalized body aches, sore throat, and cough today. Denies sick contacts.

## 2020-09-16 ENCOUNTER — Ambulatory Visit (HOSPITAL_COMMUNITY): Payer: Self-pay

## 2020-09-19 NOTE — L&D Delivery Note (Signed)
Delivery Note Progressed quickly to complete dilation and pushed twice for delivery  At 1:01 AM a viable and healthy female was delivered via Vaginal, Spontaneous (Presentation: Left Occiput Anterior).  APGAR: 9, 9; weight  .   Placenta status: Spontaneous, Intact.  Cord: 3 vessels with the following complications: tight nuchal cord x 1  Anesthesia: Epidural Episiotomy: None Lacerations:  none Suture Repair:  none Est. Blood Loss (mL):  760  Mom to postpartum.  Baby to Couplet care / Skin to Skin.  Wynelle Bourgeois 09/03/2021, 1:18 AM  Please schedule this patient for Postpartum visit in: 4 weeks with the following provider: Any provider Virtual For C/S patients schedule nurse incision check in weeks 2 weeks: no Low risk pregnancy complicated by:  none Delivery mode:  SVD Anticipated Birth Control:  other/unsure PP Procedures needed:  none   Edinburgh: negative Schedule Integrated BH visit: yes  No relevant baby issues

## 2020-10-22 DIAGNOSIS — Z3046 Encounter for surveillance of implantable subdermal contraceptive: Secondary | ICD-10-CM | POA: Diagnosis not present

## 2020-10-22 DIAGNOSIS — Z309 Encounter for contraceptive management, unspecified: Secondary | ICD-10-CM | POA: Diagnosis not present

## 2021-01-06 ENCOUNTER — Emergency Department (HOSPITAL_COMMUNITY)
Admission: EM | Admit: 2021-01-06 | Discharge: 2021-01-06 | Disposition: A | Discharge status: Home / Self Care | Payer: Medicaid Other | Attending: Emergency Medicine | Admitting: Emergency Medicine

## 2021-01-06 ENCOUNTER — Encounter (HOSPITAL_COMMUNITY): Payer: Self-pay

## 2021-01-06 ENCOUNTER — Other Ambulatory Visit: Payer: Self-pay

## 2021-01-06 DIAGNOSIS — Z3A00 Weeks of gestation of pregnancy not specified: Secondary | ICD-10-CM | POA: Diagnosis not present

## 2021-01-06 DIAGNOSIS — Z3A01 Less than 8 weeks gestation of pregnancy: Secondary | ICD-10-CM | POA: Diagnosis not present

## 2021-01-06 DIAGNOSIS — Z3491 Encounter for supervision of normal pregnancy, unspecified, first trimester: Secondary | ICD-10-CM

## 2021-01-06 DIAGNOSIS — K047 Periapical abscess without sinus: Secondary | ICD-10-CM

## 2021-01-06 DIAGNOSIS — O99611 Diseases of the digestive system complicating pregnancy, first trimester: Secondary | ICD-10-CM | POA: Insufficient documentation

## 2021-01-06 DIAGNOSIS — O26891 Other specified pregnancy related conditions, first trimester: Secondary | ICD-10-CM | POA: Diagnosis present

## 2021-01-06 LAB — PREGNANCY, URINE: Preg Test, Ur: POSITIVE — AB

## 2021-01-06 MED ORDER — ACETAMINOPHEN 325 MG PO TABS
650.0000 mg | ORAL_TABLET | Freq: Once | ORAL | Status: AC
  Administered 2021-01-06: 650 mg via ORAL
  Filled 2021-01-06: qty 2

## 2021-01-06 MED ORDER — PENICILLIN V POTASSIUM 500 MG PO TABS: 500.0000 mg | ORAL_TABLET | Freq: Four times a day (QID) | ORAL | 0 refills | Status: DC

## 2021-01-06 MED ORDER — PENICILLIN V POTASSIUM 500 MG PO TABS
500.0000 mg | ORAL_TABLET | Freq: Once | ORAL | Status: AC
  Administered 2021-01-06: 500 mg via ORAL
  Filled 2021-01-06: qty 1

## 2021-01-06 NOTE — ED Provider Notes (Signed)
MSE was initiated and I personally evaluated the patient and placed orders (if any) at  1:09 AM on January 06, 2021.  Patient to ED with left sided facial swelling and left lower dental pain x 1 day. Reports nausea - thinks she is pregnant. No fever.   Today's Vitals   01/06/21 0101  BP: 126/77  Pulse: 98  Resp: 18  Temp: 98.6 F (37 C)  TempSrc: Oral  SpO2: 100%  Weight: 69.4 kg  Height: 5\' 8"  (1.727 m)   Body mass index is 23.26 kg/m.  Left facial swelling. Generally good dentition. There is swelling along the left subglossal dental ridge as well.   The patient appears stable so that the remainder of the MSE may be completed by another provider.   , PA-C 01/06/21 0110    01/08/21, MD 01/06/21 [phone]

## 2021-01-06 NOTE — ED Provider Notes (Signed)
[facility]-EMERGENCY DEPT Provider Note   CSN: [id number] Arrival date & time: 01/06/21  0048   History Chief Complaint  Patient presents with  . Dental Pain    [facility] is a 25 y.o. female.  The history is provided by the patient.  Dental Pain She has history of depression and comes in complaining of dental pain and swelling of her jaw on the left side.  She also thinks that she is pregnant.  She had missed her expected menses on March 31.  She started having pain in the lower teeth on the left side about 2 days ago.  Today, she noted swelling on the left side of her jaw.  She is concerned that she does not want the infection to affect her baby.  If she is pregnant, she is gravida 3, para 1 with 1 miscarriage.  She has not taken anything for her pain.  Pain was rated at 7/10 on arrival in the ED.  She did receive a dose of acetaminophen and pain is down to 2/10.  Past Medical History:  Diagnosis Date  . Anemia   . Anxiety   . Chlamydia   . Depression   . Gonorrhea   . Hx of gastric ulcer   . Medical history non-contributory     Patient Active Problem List   Diagnosis Date Noted  . 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 Abdominal pain 03/11/2018  . Round ligament pain 03/11/2018  . Preterm labor 03/05/2018  . Depression   . Suicidal ideation   . Suicidal ideations   . 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 MDD (major depressive disorder), single episode, severe (HCC) 01/20/2016    Past Surgical History:  Procedure Laterality Date  . FRACTURE SURGERY     left arm     OB History    Gravida  3   Para  2   Term  2   Preterm      AB  1   Living  2     SAB  1   IAB      Ectopic      Multiple  0   Live Births  2           No family history on file.  Social History   Tobacco Use  . Smoking status: Never Smoker  .  Smokeless tobacco: Never Used  Substance Use Topics  . Alcohol use: No  . Drug use: No    Home Medications Prior to Admission medications   Medication Sig Start Date End Date Taking? Authorizing Provider  penicillin v potassium (VEETID) 500 MG tablet Take 1 tablet (500 mg total) by mouth 4 (four) times daily. 01/06/21  Yes Dione Booze, MD  ferrous sulfate 325 (65 FE) MG tablet Take 1 tablet (325 mg total) by mouth daily. 01/23/18   Rolm Bookbinder, CNM  ibuprofen (ADVIL,MOTRIN) 600 MG tablet Take 1 tablet (600 mg total) by mouth every 6 (six) hours as needed for mild pain or moderate pain. 04/11/18   Janeece Riggers, CNM  Prenatal Vit-Fe Fumarate-FA (PRENATAL MULTIVITAMIN) TABS tablet Take 1 tablet by mouth daily at 12 noon.    [provider]    Allergies    Shellfish allergy  Review of Systems   Review of Systems  All other systems reviewed and are negative.   Physical Exam Updated Vital Signs BP 126/77 (BP  Location: Right Arm)   Pulse 98   Temp 98.6 F (37 C) (Oral)   Resp 18   Ht 5\' 8"  (1.727 m)   Wt 69.4 kg   SpO2 100%   BMI 23.26 kg/m   Physical Exam Vitals and nursing note reviewed.   25 year old female, resting comfortably and in no acute distress. Vital signs are normal. Oxygen saturation is 100%, which is normal. Head is normocephalic and atraumatic. PERRLA, EOMI. Oropharynx is clear.  Dentition appears in generally good condition and there is no gingival swelling.  However, there is tenderness to percussion over teeth #18 and 19.  There is swelling of the left side of her jaw. Neck is nontender and supple without adenopathy or JVD. Back is nontender and there is no CVA tenderness. Lungs are clear without rales, wheezes, or rhonchi. Chest is nontender. Heart has regular rate and rhythm without murmur. Abdomen is soft, flat, nontender without masses or hepatosplenomegaly and peristalsis is normoactive. Extremities have no cyanosis or edema, full range of  motion is present. Skin is warm and dry without rash. Neurologic: Mental status is normal, cranial nerves are intact, there are no motor or sensory deficits.  ED Results / Procedures / Treatments   Labs (all labs ordered are listed, but only abnormal results are displayed) Labs Reviewed  PREGNANCY, URINE - Abnormal; Notable for the following components:      Result Value   Preg Test, Ur POSITIVE (*)    All other components within normal limits   Procedures Procedures   Medications Ordered in ED Medications  penicillin v potassium (VEETID) tablet 500 mg (500 mg Oral Given 01/06/21 0141)  acetaminophen (TYLENOL) tablet 650 mg (650 mg Oral Given 01/06/21 0140)    ED Course  I have reviewed the triage vital signs and the nursing notes.  Pertinent lab results that were available during my care of the patient were reviewed by me and considered in my medical decision making (see chart for details).  MDM Rules/Calculators/A&P Dental pain with probable abscess.  Pregnancy test is positive.  She is given a dose of acetaminophen as well as initial dose of penicillin V potassium and states pain is improved significantly.  Old records are reviewed, and she has no relevant past visits.  She is referred to on-call dentistry and is discharged with prescription for penicillin V potassium, advised to use over-the-counter acetaminophen as needed for pain.  She is a cigarette smoker and she was advised to stop smoking for numerous reasons including effect on pregnancy as well as dental health.  Return precautions discussed.  Final Clinical Impression(s) / ED Diagnoses Final diagnoses:  Dental abscess  First trimester pregnancy    Rx / DC Orders ED Discharge Orders         Ordered    penicillin v potassium (VEETID) 500 MG tablet  4 times daily        01/06/21 0348           01/08/21, MD 01/06/21 [phone]

## 2021-01-06 NOTE — ED Triage Notes (Signed)
Pt reports left lower dental pain that began 2 nights ago. Pt also admits to missing her march menstrual cycle.

## 2021-01-06 NOTE — Discharge Instructions (Addendum)
You may take acetaminophen for pain.  Please make sure to see a dentist.  Antibiotics will help with the infection but will not fix the underlying tooth problem.  Please make arrangements for prenatal care.  Return if you are having any problems.

## 2021-03-10 ENCOUNTER — Encounter: Payer: Medicaid Other | Admitting: Obstetrics

## 2021-03-15 ENCOUNTER — Encounter (HOSPITAL_COMMUNITY): Payer: Self-pay | Admitting: *Deleted

## 2021-03-15 ENCOUNTER — Inpatient Hospital Stay (HOSPITAL_BASED_OUTPATIENT_CLINIC_OR_DEPARTMENT_OTHER): Payer: Medicaid Other

## 2021-03-15 ENCOUNTER — Inpatient Hospital Stay (HOSPITAL_COMMUNITY)
Admission: AD | Admit: 2021-03-15 | Discharge: 2021-03-15 | Disposition: A | Discharge status: Home / Self Care | Payer: Medicaid Other | Attending: Obstetrics and Gynecology | Admitting: Obstetrics and Gynecology

## 2021-03-15 DIAGNOSIS — N760 Acute vaginitis: Secondary | ICD-10-CM

## 2021-03-15 DIAGNOSIS — O4692 Antepartum hemorrhage, unspecified, second trimester: Secondary | ICD-10-CM

## 2021-03-15 DIAGNOSIS — O23592 Infection of other part of genital tract in pregnancy, second trimester: Secondary | ICD-10-CM | POA: Insufficient documentation

## 2021-03-15 DIAGNOSIS — Z3A15 15 weeks gestation of pregnancy: Secondary | ICD-10-CM | POA: Insufficient documentation

## 2021-03-15 DIAGNOSIS — O99891 Other specified diseases and conditions complicating pregnancy: Secondary | ICD-10-CM | POA: Diagnosis not present

## 2021-03-15 DIAGNOSIS — O4442 Low lying placenta NOS or without hemorrhage, second trimester: Secondary | ICD-10-CM | POA: Insufficient documentation

## 2021-03-15 DIAGNOSIS — O039 Complete or unspecified spontaneous abortion without complication: Secondary | ICD-10-CM | POA: Diagnosis not present

## 2021-03-15 DIAGNOSIS — O0932 Supervision of pregnancy with insufficient antenatal care, second trimester: Secondary | ICD-10-CM | POA: Diagnosis not present

## 2021-03-15 DIAGNOSIS — O99332 Smoking (tobacco) complicating pregnancy, second trimester: Secondary | ICD-10-CM | POA: Insufficient documentation

## 2021-03-15 DIAGNOSIS — R1084 Generalized abdominal pain: Secondary | ICD-10-CM | POA: Diagnosis not present

## 2021-03-15 DIAGNOSIS — B9689 Other specified bacterial agents as the cause of diseases classified elsewhere: Secondary | ICD-10-CM | POA: Diagnosis not present

## 2021-03-15 DIAGNOSIS — O0931 Supervision of pregnancy with insufficient antenatal care, first trimester: Secondary | ICD-10-CM | POA: Diagnosis present

## 2021-03-15 DIAGNOSIS — F17210 Nicotine dependence, cigarettes, uncomplicated: Secondary | ICD-10-CM | POA: Diagnosis not present

## 2021-03-15 DIAGNOSIS — O4440 Low lying placenta NOS or without hemorrhage, unspecified trimester: Secondary | ICD-10-CM

## 2021-03-15 DIAGNOSIS — O200 Threatened abortion: Secondary | ICD-10-CM | POA: Diagnosis not present

## 2021-03-15 LAB — URINALYSIS, ROUTINE W REFLEX MICROSCOPIC
Bilirubin Urine: NEGATIVE
Glucose, UA: NEGATIVE mg/dL
Hgb urine dipstick: NEGATIVE
Ketones, ur: 20 mg/dL — AB
Leukocytes,Ua: NEGATIVE
Nitrite: NEGATIVE
Protein, ur: NEGATIVE mg/dL
Specific Gravity, Urine: 1.014 (ref 1.005–1.030)
pH: 7 (ref 5.0–8.0)

## 2021-03-15 LAB — WET PREP, GENITAL
Sperm: NONE SEEN
Trich, Wet Prep: NONE SEEN
Yeast Wet Prep HPF POC: NONE SEEN

## 2021-03-15 MED ORDER — METRONIDAZOLE 500 MG PO TABS: 500.0000 mg | ORAL_TABLET | Freq: Two times a day (BID) | ORAL | 0 refills | Status: AC
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
## 2021-03-16 LAB — GC/CHLAMYDIA PROBE AMP (~~LOC~~) NOT AT ARMC
Chlamydia: NEGATIVE
Comment: NEGATIVE
Comment: NORMAL
Neisseria Gonorrhea: NEGATIVE

## 2021-04-22 ENCOUNTER — Ambulatory Visit (INDEPENDENT_AMBULATORY_CARE_PROVIDER_SITE_OTHER): Payer: Medicaid Other | Admitting: *Deleted

## 2021-04-22 VITALS — BP 110/70 | HR 90 | Wt 165.6 lb

## 2021-04-22 DIAGNOSIS — Z3480 Encounter for supervision of other normal pregnancy, unspecified trimester: Secondary | ICD-10-CM | POA: Insufficient documentation

## 2021-04-22 NOTE — Progress Notes (Signed)
New OB Intake  I connected with  [facility] on 04/22/21 at  9:00 AM EDT by In person and verified that I am speaking with the correct person using two identifiers. Nurse is located at [facility] and pt is located at [facility].  I discussed the limitations, risks, security and privacy concerns of performing an evaluation and management service by telephone and the availability of in person appointments. I also discussed with the patient that there may be a patient responsible charge related to this service. The patient expressed understanding and agreed to proceed.  I explained I am completing New OB Intake today. We discussed her EDD of 09/03/21 that is based on LMP of 11/27/20. Pt is G4/P2. I reviewed her allergies, medications, Medical/Surgical/OB history, and appropriate screenings. I informed her of [facility] services. Based on history, this is a/an  pregnancy complicated by late to care  .   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   MyChart/Babyscripts MyChart access verified. I explained pt will have some visits in office and some virtually. Babyscripts instructions given and order placed. Patient verifies receipt of registration text/e-mail. Account successfully created and app downloaded.  Blood Pressure Cuff  BP cuff not ordered. Patient declines Babyscripts and states would not be comfortable taking her BP.   Weight scale:Patient declined  Anatomy US Explained first scheduled [place] will be around 19  weeks. Anatomy US scheduled for TBD at TBD. Pt notified to arrive at TBD. Pt is 20.6 today. Anatomy US ordered.  Labs Discussed Avelina Laine genetic screening with patient. Would like both Panorama and Horizon drawn at new OB visit. Routine prenatal labs needed. Patient deciding on Panorama and Horizon  Covid Vaccine Patient has not covid vaccine.   Social Determinants of Health Food Insecurity: Patient denies food insecurity. WIC Referral: Patient is not interested in referral to [facility].  Transportation: Patient denies transportation needs. Childcare: Discussed no children allowed at ultrasound appointments. Offered childcare services; patient declines childcare services at this time.  First visit review I reviewed new OB appt with pt. I explained she will have a pelvic exam, ob bloodwork with genetic screening, and PAP smear. Explained pt will be seen by Dr. Clearance Coots at first visit; encounter routed to appropriate provider. Explained that patient will be seen by pregnancy navigator following visit with provider. [facility] information placed in AVS.   Harrel Lemon, RN 04/22/2021  8:57 AM

## 2021-04-30 ENCOUNTER — Ambulatory Visit (INDEPENDENT_AMBULATORY_CARE_PROVIDER_SITE_OTHER): Payer: Medicaid Other | Admitting: Obstetrics

## 2021-04-30 ENCOUNTER — Other Ambulatory Visit (HOSPITAL_COMMUNITY)
Admission: RE | Admit: 2021-04-30 | Discharge: 2021-04-30 | Disposition: A | Discharge status: Home / Self Care | Payer: Medicaid Other | Source: Ambulatory Visit | Attending: Obstetrics | Admitting: Obstetrics

## 2021-04-30 ENCOUNTER — Other Ambulatory Visit: Payer: Self-pay

## 2021-04-30 ENCOUNTER — Encounter: Payer: Self-pay | Admitting: Obstetrics

## 2021-04-30 VITALS — BP 118/65 | HR 92 | Wt 161.0 lb

## 2021-04-30 DIAGNOSIS — Z31430 Encounter of female for testing for genetic disease carrier status for procreative management: Secondary | ICD-10-CM | POA: Diagnosis not present

## 2021-04-30 DIAGNOSIS — Z3A22 22 weeks gestation of pregnancy: Secondary | ICD-10-CM

## 2021-04-30 DIAGNOSIS — Z3480 Encounter for supervision of other normal pregnancy, unspecified trimester: Secondary | ICD-10-CM | POA: Diagnosis not present

## 2021-04-30 DIAGNOSIS — F32A Depression, unspecified: Secondary | ICD-10-CM

## 2021-04-30 DIAGNOSIS — N760 Acute vaginitis: Secondary | ICD-10-CM

## 2021-04-30 DIAGNOSIS — F17200 Nicotine dependence, unspecified, uncomplicated: Secondary | ICD-10-CM

## 2021-04-30 DIAGNOSIS — O26899 Other specified pregnancy related conditions, unspecified trimester: Secondary | ICD-10-CM

## 2021-04-30 DIAGNOSIS — R12 Heartburn: Secondary | ICD-10-CM

## 2021-04-30 DIAGNOSIS — B9689 Other specified bacterial agents as the cause of diseases classified elsewhere: Secondary | ICD-10-CM

## 2021-04-30 LAB — HEPATITIS C ANTIBODY: HCV Ab: NEGATIVE

## 2021-04-30 MED ORDER — CITRANATAL BLOOM 90-1 MG PO TABS: 1.0000 | ORAL_TABLET | Freq: Every day | ORAL | 4 refills | Status: DC

## 2021-04-30 MED ORDER — METRONIDAZOLE 500 MG PO TABS: 500.0000 mg | ORAL_TABLET | Freq: Two times a day (BID) | ORAL | 2 refills | Status: DC

## 2021-04-30 MED ORDER — GOJJI WEIGHT SCALE MISC: 1.0000 | 0 refills | Status: DC | PRN

## 2021-04-30 MED ORDER — BLOOD PRESSURE KIT DEVI: 1.0000 | 0 refills | Status: DC | PRN

## 2021-04-30 MED ORDER — PANTOPRAZOLE SODIUM 40 MG PO TBEC: 40.0000 mg | DELAYED_RELEASE_TABLET | Freq: Every day | ORAL | 6 refills | Status: DC

## 2021-04-30 NOTE — Progress Notes (Signed)
Pt presents for late to care NOB. This not a planned pregnancy, FOB is supportive, and they are living together.

## 2021-04-30 NOTE — Progress Notes (Signed)
Subjective:    Pamela Jefferson is being seen today for her first obstetrical visit.  This is not a planned pregnancy. She is at 55w0dgestation. Her obstetrical history is significant for smoker. Relationship with FOB: significant other, living together. Patient does intend to breast feed. Pregnancy history fully reviewed.  The information documented in the HPI was reviewed and verified.  Menstrual History: OB History     Gravida  4   Para  2   Term  2   Preterm      AB  1   Living  2      SAB  1   IAB      Ectopic      Multiple  0   Live Births  2           Patient's last menstrual period was 11/27/2020 (exact date).    Past Medical History:  Diagnosis Date   Anemia    Anxiety    Chlamydia    Depression    "about ready to give up:", no help, just tired   Gonorrhea    Hx of gastric ulcer    Medical history non-contributory     Past Surgical History:  Procedure Laterality Date   FRACTURE SURGERY     left arm    (Not in a hospital admission)  Allergies  Allergen Reactions   Shellfish Allergy Anaphylaxis    Social History   Tobacco Use   Smoking status: Every Day    Packs/day: 0.50    Years: 4.00    Pack years: 2.00    Types: Cigarettes   Smokeless tobacco: Never  Substance Use Topics   Alcohol use: No    Family History  Problem Relation Age of Onset   Neurologic Disorder Mother    Hip fracture Maternal Grandmother    Cancer Maternal Grandfather      Review of Systems Constitutional: negative for weight loss Gastrointestinal: negative for vomiting Genitourinary:negative for genital lesions and vaginal discharge and dysuria Musculoskeletal:negative for back pain Behavioral/Psych: negative for abusive relationship, depression, illegal drug usage and tobacco use    Objective:    BP 118/65   Pulse 92   Wt 161 lb (73 kg)   LMP 11/27/2020 (Exact Date)   BMI 24.48 kg/m  General Appearance:    Alert, cooperative, no distress,  appears stated age  Head:    Normocephalic, without obvious abnormality, atraumatic  Eyes:    PERRL, conjunctiva/corneas clear, EOM's intact, fundi    benign, both eyes  Ears:    Normal TM's and external ear canals, both ears  Nose:   Nares normal, septum midline, mucosa normal, no drainage    or sinus tenderness  Throat:   Lips, mucosa, and tongue normal; teeth and gums normal  Neck:   Supple, symmetrical, trachea midline, no adenopathy;    thyroid:  no enlargement/tenderness/nodules; no carotid   bruit or JVD  Back:     Symmetric, no curvature, ROM normal, no CVA tenderness  Lungs:     Clear to auscultation bilaterally, respirations unlabored  Chest Wall:    No tenderness or deformity   Heart:    Regular rate and rhythm, S1 and S2 normal, no murmur, rub   or gallop  Breast Exam:    No tenderness, masses, or nipple abnormality  Abdomen:     Soft, non-tender, bowel sounds active all four quadrants,    no masses, no organomegaly  Genitalia:    Normal female without lesion,  discharge or tenderness  Extremities:   Extremities normal, atraumatic, no cyanosis or edema  Pulses:   2+ and symmetric all extremities  Skin:   Skin color, texture, turgor normal, no rashes or lesions  Lymph nodes:   Cervical, supraclavicular, and axillary nodes normal  Neurologic:   CNII-XII intact, normal strength, sensation and reflexes    throughout      Lab Review Urine pregnancy test Labs reviewed yes Radiologic studies reviewed no  Assessment:    Pregnancy at 30w0dweeks    Plan:    1. Supervision of other normal pregnancy, antepartum Rx: - Culture, OB Urine - Genetic Screening - Hepatitis C Antibody - Cervicovaginal ancillary only( [place]) - Cytology - PAP( [place]) - Obstetric Panel, Including HIV - [place]FM OB COMP + 115WK; Future - Prenatal-DSS-FeCb-FeGl-FA (CITRANATAL BLOOM) 90-1 MG TABS; Take 1 tablet by mouth daily before breakfast.  Dispense: 90 tablet; Refill: 4  2. BV  (bacterial vaginosis) Rx: - metroNIDAZOLE (FLAGYL) 500 MG tablet; Take 1 tablet (500 mg total) by mouth 2 (two) times daily.  Dispense: 14 tablet; Refill: 2  3. Depression, unspecified depression type - clinically stable  4. Heartburn during pregnancy, antepartum -Rx: - pantoprazole (PROTONIX) 40 MG tablet; Take 1 tablet (40 mg total) by mouth daily.  Dispense: 30 tablet; Refill: 6  5. Tobacco dependence - cessation with the aid of medication and behavioral modification recommended    Prenatal vitamins.  Counseling provided regarding continued use of seat belts, cessation of alcohol consumption, smoking or use of illicit drugs; infection precautions i.e., influenza/TDAP immunizations, toxoplasmosis,CMV, parvovirus, listeria and varicella; workplace safety, exercise during pregnancy; routine dental care, safe medications, sexual activity, hot tubs, saunas, pools, travel, caffeine use, fish and methlymercury, potential toxins, hair treatments, varicose veins Weight gain recommendations per IOM guidelines reviewed: underweight/BMI< 18.5--> gain 28 - 40 lbs; normal weight/BMI 18.5 - 24.9--> gain 25 - 35 lbs; overweight/BMI 25 - 29.9--> gain 15 - 25 lbs; obese/BMI >30->gain  11 - 20 lbs Problem list reviewed and updated. FIRST/CF mutation testing/NIPT/QUAD SCREEN/fragile X/Ashkenazi Jewish population testing/Spinal muscular atrophy discussed: requested. Role of ultrasound in pregnancy discussed; fetal survey: requested. Amniocentesis discussed: not indicated.  Meds ordered this encounter  Medications   Misc. Devices (GOJJI WEIGHT SCALE) MISC    Sig: 1 Device by Does not apply route as needed.    Dispense:  1 each    Refill:  0   Blood Pressure Monitoring (BLOOD PRESSURE KIT) DEVI    Sig: 1 Device by Does not apply route as needed.    Dispense:  1 each    Refill:  0   Orders Placed This Encounter  Procedures   Culture, OB Urine   UKoreaMFM OB COMP + 14 WK    Standing Status:   Future     Standing Expiration Date:   04/30/2022    Order Specific Question:   Reason for Exam (SYMPTOM  OR DIAGNOSIS REQUIRED)    Answer:   anatomy    Order Specific Question:   Preferred Location    Answer:   [facility] Ultrasound   Genetic Screening    [facility]   Hepatitis C Antibody   Obstetric Panel, Including HIV    Follow up in 4 weeks.  I have spent a total of 20 minutes of face-to-face time, excluding clinical staff time, reviewing notes and preparing to see patient, ordering tests and/or medications, and counseling the patient.   HShelly Bombard MD 04/30/2021 10:22 AM

## 2021-05-01 ENCOUNTER — Other Ambulatory Visit: Payer: Self-pay | Admitting: Obstetrics

## 2021-05-01 LAB — OBSTETRIC PANEL, INCLUDING HIV
Antibody Screen: NEGATIVE
Basophils Absolute: 0.1 10*3/uL (ref 0.0–0.2)
Basos: 1 %
EOS (ABSOLUTE): 0.2 10*3/uL (ref 0.0–0.4)
Eos: 2 %
HIV Screen 4th Generation wRfx: NONREACTIVE
Hematocrit: 30.3 % — ABNORMAL LOW (ref 34.0–46.6)
Hemoglobin: 10.2 g/dL — ABNORMAL LOW (ref 11.1–15.9)
Hepatitis B Surface Ag: NEGATIVE
Immature Grans (Abs): 0.1 10*3/uL (ref 0.0–0.1)
Immature Granulocytes: 1 %
Lymphocytes Absolute: 1.5 10*3/uL (ref 0.7–3.1)
Lymphs: 14 %
MCH: 32.6 pg (ref 26.6–33.0)
MCHC: 33.7 g/dL (ref 31.5–35.7)
MCV: 97 fL (ref 79–97)
Monocytes Absolute: 0.6 10*3/uL (ref 0.1–0.9)
Monocytes: 6 %
Neutrophils Absolute: 7.9 10*3/uL — ABNORMAL HIGH (ref 1.4–7.0)
Neutrophils: 76 %
Platelets: 275 10*3/uL (ref 150–450)
RBC: 3.13 x10E6/uL — ABNORMAL LOW (ref 3.77–5.28)
RDW: 13.1 % (ref 11.7–15.4)
RPR Ser Ql: NONREACTIVE
Rh Factor: POSITIVE
Rubella Antibodies, IGG: 1.65 index (ref >0.99)
WBC: 10.2 10*3/uL (ref 3.4–10.8)

## 2021-05-01 LAB — HEPATITIS C ANTIBODY: Hep C Virus Ab: 0.1 s/co ratio (ref 0.0–0.9)

## 2021-05-02 LAB — URINE CULTURE, OB REFLEX

## 2021-05-02 LAB — CULTURE, OB URINE

## 2021-05-03 ENCOUNTER — Telehealth: Payer: Self-pay

## 2021-05-03 ENCOUNTER — Other Ambulatory Visit: Payer: Self-pay | Admitting: Obstetrics

## 2021-05-03 DIAGNOSIS — Z3400 Encounter for supervision of normal first pregnancy, unspecified trimester: Secondary | ICD-10-CM | POA: Diagnosis not present

## 2021-05-03 LAB — CERVICOVAGINAL ANCILLARY ONLY
Bacterial Vaginitis (gardnerella): POSITIVE — AB
Candida Glabrata: NEGATIVE
Candida Vaginitis: POSITIVE — AB
Chlamydia: NEGATIVE
Comment: NEGATIVE
Comment: NEGATIVE
Comment: NEGATIVE
Comment: NEGATIVE
Comment: NEGATIVE
Comment: NORMAL
Neisseria Gonorrhea: NEGATIVE
Trichomonas: NEGATIVE

## 2021-05-03 NOTE — Telephone Encounter (Signed)
Call patient to inform of test results. No answer or voice mail to leave a message.

## 2021-05-03 NOTE — Telephone Encounter (Signed)
-----   Message from Brock Bad, MD sent at 05/01/2021  8:28 AM EDT ----- Anemia.  Citranatal Bloom.  Rx

## 2021-05-05 LAB — CYTOLOGY - PAP: Diagnosis: NEGATIVE

## 2021-05-10 ENCOUNTER — Encounter: Payer: Self-pay | Admitting: Obstetrics

## 2021-05-13 ENCOUNTER — Inpatient Hospital Stay (HOSPITAL_COMMUNITY)
Admission: AD | Admit: 2021-05-13 | Discharge: 2021-05-13 | Disposition: A | Discharge status: Home / Self Care | Payer: Medicaid Other | Attending: Obstetrics & Gynecology | Admitting: Obstetrics & Gynecology

## 2021-05-13 ENCOUNTER — Other Ambulatory Visit: Payer: Self-pay

## 2021-05-13 ENCOUNTER — Encounter (HOSPITAL_COMMUNITY): Payer: Self-pay | Admitting: Obstetrics & Gynecology

## 2021-05-13 DIAGNOSIS — Z87891 Personal history of nicotine dependence: Secondary | ICD-10-CM | POA: Insufficient documentation

## 2021-05-13 DIAGNOSIS — Z3A23 23 weeks gestation of pregnancy: Secondary | ICD-10-CM | POA: Diagnosis not present

## 2021-05-13 DIAGNOSIS — W501XXA Accidental kick by another person, initial encounter: Secondary | ICD-10-CM | POA: Diagnosis not present

## 2021-05-13 DIAGNOSIS — O9A212 Injury, poisoning and certain other consequences of external causes complicating pregnancy, second trimester: Secondary | ICD-10-CM | POA: Diagnosis not present

## 2021-05-13 DIAGNOSIS — O368120 Decreased fetal movements, second trimester, not applicable or unspecified: Secondary | ICD-10-CM | POA: Diagnosis not present

## 2021-05-13 DIAGNOSIS — Z3480 Encounter for supervision of other normal pregnancy, unspecified trimester: Secondary | ICD-10-CM

## 2021-05-13 LAB — URINALYSIS, ROUTINE W REFLEX MICROSCOPIC
Bilirubin Urine: NEGATIVE
Glucose, UA: NEGATIVE mg/dL
Hgb urine dipstick: NEGATIVE
Ketones, ur: NEGATIVE mg/dL
Nitrite: NEGATIVE
Protein, ur: NEGATIVE mg/dL
Specific Gravity, Urine: 1.017 (ref 1.005–1.030)
Squamous Epithelial / HPF: >50 — ABNORMAL HIGH (ref 0–5)
WBC, UA: >50 WBC/hpf — ABNORMAL HIGH (ref 0–5)
pH: 9 — ABNORMAL HIGH (ref 5.0–8.0)
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
## 2021-05-14 ENCOUNTER — Encounter: Payer: Self-pay | Admitting: Obstetrics

## 2021-05-18 ENCOUNTER — Other Ambulatory Visit: Payer: Self-pay

## 2021-05-18 ENCOUNTER — Encounter: Payer: Self-pay | Admitting: *Deleted

## 2021-05-18 ENCOUNTER — Ambulatory Visit: Payer: Medicaid Other | Attending: Obstetrics

## 2021-05-18 ENCOUNTER — Ambulatory Visit: Payer: Medicaid Other | Admitting: *Deleted

## 2021-05-18 VITALS — BP 122/61 | HR 84

## 2021-05-18 DIAGNOSIS — Z3480 Encounter for supervision of other normal pregnancy, unspecified trimester: Secondary | ICD-10-CM | POA: Insufficient documentation

## 2021-05-19 ENCOUNTER — Telehealth: Payer: Self-pay

## 2021-05-19 NOTE — Telephone Encounter (Signed)
Left message for patient to call office to schedule a follow up ultrasound to complete anatomy in 4 weeks from 05/18/21. Order has been scanned into patients chart.

## 2021-05-20 ENCOUNTER — Other Ambulatory Visit: Payer: Self-pay | Admitting: *Deleted

## 2021-05-20 DIAGNOSIS — Z362 Encounter for other antenatal screening follow-up: Secondary | ICD-10-CM

## 2021-05-28 ENCOUNTER — Encounter: Payer: Medicaid Other | Admitting: Obstetrics

## 2021-06-15 ENCOUNTER — Ambulatory Visit: Payer: Medicaid Other | Admitting: *Deleted

## 2021-06-15 ENCOUNTER — Ambulatory Visit: Payer: Medicaid Other | Attending: Obstetrics

## 2021-06-15 ENCOUNTER — Other Ambulatory Visit: Payer: Self-pay

## 2021-06-15 ENCOUNTER — Encounter: Payer: Self-pay | Admitting: *Deleted

## 2021-06-15 VITALS — BP 127/64 | HR 83

## 2021-06-15 DIAGNOSIS — Z3A28 28 weeks gestation of pregnancy: Secondary | ICD-10-CM

## 2021-06-15 DIAGNOSIS — Z3480 Encounter for supervision of other normal pregnancy, unspecified trimester: Secondary | ICD-10-CM

## 2021-06-15 DIAGNOSIS — Z362 Encounter for other antenatal screening follow-up: Secondary | ICD-10-CM | POA: Insufficient documentation

## 2021-06-15 DIAGNOSIS — O0933 Supervision of pregnancy with insufficient antenatal care, third trimester: Secondary | ICD-10-CM | POA: Diagnosis not present

## 2021-06-17 ENCOUNTER — Other Ambulatory Visit: Payer: Self-pay

## 2021-06-17 ENCOUNTER — Encounter: Payer: Self-pay | Admitting: Obstetrics

## 2021-06-17 ENCOUNTER — Ambulatory Visit (INDEPENDENT_AMBULATORY_CARE_PROVIDER_SITE_OTHER): Payer: Medicaid Other | Admitting: Obstetrics

## 2021-06-17 DIAGNOSIS — Z3480 Encounter for supervision of other normal pregnancy, unspecified trimester: Secondary | ICD-10-CM

## 2021-06-17 NOTE — Progress Notes (Signed)
Subjective:  Pamela Jefferson is a 25 y.o. [phone] at [redacted]w[redacted]d being seen today for ongoing prenatal care.  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  Patient reports no complaints.  Contractions: Not present. Vag. Bleeding: None.  Movement: Present. Denies leaking of fluid.   The following portions of the patient's history were reviewed and updated as appropriate: allergies, current medications, past family history, past medical history, past social history, past surgical history and problem list. Problem list updated.  Objective:   Vitals:   06/17/21 0848  BP: 124/77  Pulse: (!) 104  Weight: 184 lb (83.5 kg)    Fetal Status:     Movement: Present     General:  Alert, oriented and cooperative. Patient is in no acute distress.  Skin: Skin is warm and dry. No rash noted.   Cardiovascular: Normal heart rate noted  Respiratory: Normal respiratory effort, no problems with respiration noted  Abdomen: Soft, gravid, appropriate for gestational age. Pain/Pressure: Absent     Pelvic:  Cervical exam deferred        Extremities: Normal range of motion.     Mental Status: Normal mood and affect. Normal behavior. Normal judgment and thought content.   Urinalysis:      Assessment and Plan:  Pregnancy: [id number] at [redacted]w[redacted]d  1. Supervision of other normal pregnancy, antepartum   Preterm labor symptoms and general obstetric precautions including but not limited to vaginal bleeding, contractions, leaking of fluid and fetal movement were reviewed in detail with the  patient. Please refer to After Visit Summary for other counseling recommendations.   Return in about 2 weeks (around 07/01/2021) for ROB.   Brock Bad, MD  06/17/21

## 2021-06-25 ENCOUNTER — Other Ambulatory Visit: Payer: Medicaid Other

## 2021-07-01 ENCOUNTER — Encounter: Payer: Medicaid Other | Admitting: Obstetrics and Gynecology

## 2021-07-01 ENCOUNTER — Other Ambulatory Visit: Payer: Medicaid Other

## 2021-07-08 ENCOUNTER — Telehealth: Payer: Self-pay | Admitting: Obstetrics and Gynecology

## 2021-07-21 ENCOUNTER — Other Ambulatory Visit: Payer: Medicaid Other

## 2021-07-21 ENCOUNTER — Encounter: Payer: Medicaid Other | Admitting: Obstetrics & Gynecology

## 2021-07-23 ENCOUNTER — Other Ambulatory Visit: Payer: Medicaid Other

## 2021-07-23 ENCOUNTER — Encounter: Payer: Medicaid Other | Admitting: Obstetrics

## 2021-07-28 ENCOUNTER — Inpatient Hospital Stay (HOSPITAL_COMMUNITY)
Admission: AD | Admit: 2021-07-28 | Discharge: 2021-07-28 | Disposition: A | Discharge status: Home / Self Care | Payer: Medicaid Other | Attending: Obstetrics and Gynecology | Admitting: Obstetrics and Gynecology

## 2021-07-28 ENCOUNTER — Encounter (HOSPITAL_COMMUNITY): Payer: Self-pay | Admitting: Obstetrics and Gynecology

## 2021-07-28 ENCOUNTER — Other Ambulatory Visit: Payer: Self-pay

## 2021-07-28 DIAGNOSIS — M545 Low back pain, unspecified: Secondary | ICD-10-CM | POA: Insufficient documentation

## 2021-07-28 DIAGNOSIS — O26893 Other specified pregnancy related conditions, third trimester: Secondary | ICD-10-CM | POA: Insufficient documentation

## 2021-07-28 DIAGNOSIS — O212 Late vomiting of pregnancy: Secondary | ICD-10-CM | POA: Insufficient documentation

## 2021-07-28 DIAGNOSIS — R519 Headache, unspecified: Secondary | ICD-10-CM | POA: Diagnosis not present

## 2021-07-28 DIAGNOSIS — J029 Acute pharyngitis, unspecified: Secondary | ICD-10-CM | POA: Diagnosis not present

## 2021-07-28 DIAGNOSIS — R059 Cough, unspecified: Secondary | ICD-10-CM | POA: Insufficient documentation

## 2021-07-28 DIAGNOSIS — Z3689 Encounter for other specified antenatal screening: Secondary | ICD-10-CM

## 2021-07-28 DIAGNOSIS — Z20822 Contact with and (suspected) exposure to covid-19: Secondary | ICD-10-CM | POA: Insufficient documentation

## 2021-07-28 DIAGNOSIS — O99513 Diseases of the respiratory system complicating pregnancy, third trimester: Secondary | ICD-10-CM | POA: Diagnosis not present

## 2021-07-28 DIAGNOSIS — Z3A34 34 weeks gestation of pregnancy: Secondary | ICD-10-CM | POA: Diagnosis not present

## 2021-07-28 DIAGNOSIS — R6889 Other general symptoms and signs: Secondary | ICD-10-CM

## 2021-07-28 LAB — URINALYSIS, ROUTINE W REFLEX MICROSCOPIC
Bilirubin Urine: NEGATIVE
Glucose, UA: NEGATIVE mg/dL
Hgb urine dipstick: NEGATIVE
Ketones, ur: NEGATIVE mg/dL
Nitrite: NEGATIVE
Protein, ur: 30 mg/dL — AB
Specific Gravity, Urine: 1.019 (ref 1.005–1.030)
pH: 7 (ref 5.0–8.0)

## 2021-07-28 LAB — RESP PANEL BY RT-PCR (FLU A&B, COVID) ARPGX2
Influenza A by PCR: POSITIVE — AB
Influenza B by PCR: NEGATIVE
SARS Coronavirus 2 by RT PCR: NEGATIVE

## 2021-07-28 MED ORDER — GUAIFENESIN 100 MG/5ML PO LIQD: 100.0000 mg | ORAL | 0 refills | Status: DC | PRN

## 2021-07-28 MED ORDER — ACETAMINOPHEN 500 MG PO TABS: 500.0000 mg | ORAL_TABLET | Freq: Four times a day (QID) | ORAL | 1 refills | Status: DC | PRN

## 2021-07-28 MED ORDER — ACETAMINOPHEN 500 MG PO TABS
1000.0000 mg | ORAL_TABLET | Freq: Once | ORAL | Status: AC
  Administered 2021-07-28: 1000 mg via ORAL
  Filled 2021-07-28: qty 2

## 2021-07-28 MED ORDER — BENZOCAINE-MENTHOL 6-10 MG MT LOZG: 1.0000 | LOZENGE | OROMUCOSAL | 1 refills | Status: DC | PRN

## 2021-07-28 NOTE — Discharge Instructions (Signed)

## 2021-07-28 NOTE — MAU Note (Signed)
Not feeling good.  Threw up blood this morning, last night couldn't sleep, back was hurting.  Has a HA, has been coughing and had a sore throat. Denies fever.

## 2021-07-28 NOTE — MAU Provider Note (Signed)
History     CSN: [id number]  Arrival date and time: 07/28/21 1008   Event Date/Time   First Provider Initiated Contact with Patient 07/28/21 1109      Chief Complaint  Patient presents with   Headache   Emesis   Back Pain   Sore Throat   Cough   Derrick Orris is a 25 y.o. [id number] at 59w5dwho receives care at [facility].  She presents today for Headache, Emesis, Back Pain, Sore Throat, and Cough.  She states her symptoms began last night around 11pm.  She denies being around sick individuals, but works outside the home at [employer]  She reports she worked yesterday from [employer] She states her HA "just hurts" and rates it a 8/10. She reports her back pain is in her lower back and describes it as "numb."  She states she has not taken any medications, but reports she took Thermaflu yesterday before work because she noticed a cough. She reports she had one incident of vomiting prior to arrival.  She denies current nausea and a history of allergies.  She endorses fetal movement and denies vaginal bleeding, discharge, or leaking.     OB History     Gravida  4   Para  2   Term  2   Preterm      AB  1   Living  2      SAB  1   IAB      Ectopic      Multiple  0   Live Births  2           Past Medical History:  Diagnosis Date   Anemia    Anxiety    Chlamydia    Depression    "about ready to give up:", no help, just tired   Gonorrhea    Hx of gastric ulcer    Medical history non-contributory     Past Surgical History:  Procedure Laterality Date   FRACTURE SURGERY     left arm    Family History  Problem Relation Age of Onset   Neurologic Disorder Mother    Hip fracture Maternal Grandmother    Cancer Maternal Grandfather     Social History   Tobacco Use   Smoking status: Former    Packs/day: 0.50    Years: 4.00    Pack years: 2.00    Types: Cigarettes   Smokeless tobacco: Never  Vaping Use   Vaping Use: Never used  Substance Use Topics    Alcohol use: No   Drug use: No    Allergies:  Allergies  Allergen Reactions   Shellfish Allergy Anaphylaxis    Medications Prior to Admission  Medication Sig Dispense Refill Last Dose   pantoprazole (PROTONIX) 40 MG tablet Take 1 tablet (40 mg total) by mouth daily. 30 tablet 6 Past Month   Prenatal Vit-Fe Fumarate-FA (PRENATAL MULTIVITAMIN) TABS tablet Take 1 tablet by mouth daily at 12 noon.   07/27/2021   Blood Pressure Monitoring (BLOOD PRESSURE KIT) DEVI 1 Device by Does not apply route as needed. 1 each 0    Misc. Devices (GOJJI WEIGHT SCALE) MISC 1 Device by Does not apply route as needed. 1 each 0    Prenatal-DSS-FeCb-FeGl-FA (CITRANATAL BLOOM) 90-1 MG TABS Take 1 tablet by mouth daily before breakfast. 90 tablet 4     Review of Systems  Constitutional:  Positive for chills. Negative for fever.  HENT:  Positive for congestion, rhinorrhea and  sore throat. Negative for sinus pressure and sinus pain.   Respiratory:  Positive for cough. Negative for chest tightness and shortness of breath.   Cardiovascular:  Negative for chest pain.  Gastrointestinal:  Positive for nausea and vomiting. Negative for abdominal pain.  Genitourinary:  Negative for difficulty urinating, dysuria, vaginal bleeding and vaginal discharge.  Musculoskeletal:  Positive for back pain.  Neurological:  Positive for headaches. Negative for dizziness and light-headedness.  Physical Exam   Blood pressure (!) 102/56, pulse (!) 112, temperature 100 F (37.8 C), temperature source Oral, resp. rate 16, height _0  (1.727 m), weight 85.8 kg, last menstrual period 11/27/2020, SpO2 99 %, unknown if currently breastfeeding.  Physical Exam Constitutional:      Appearance: Normal appearance. She is well-developed.  HENT:     Head: Normocephalic and atraumatic.     Mouth/Throat:     Tonsils: No tonsillar exudate or tonsillar abscesses. 0 on the right.  Eyes:     Conjunctiva/sclera: Conjunctivae normal.   Cardiovascular:     Rate and Rhythm: Regular rhythm. Tachycardia present.     Heart sounds: Normal heart sounds.  Pulmonary:     Effort: Pulmonary effort is normal. No respiratory distress.     Breath sounds: Normal breath sounds. No wheezing, rhonchi or rales.  Abdominal:     Palpations: Abdomen is soft.     Tenderness: There is no abdominal tenderness.  Musculoskeletal:        General: Normal range of motion.     Cervical back: Normal range of motion.  Skin:    General: Skin is warm and dry.  Neurological:     Mental Status: She is alert and oriented to person, place, and time.  Psychiatric:        Mood and Affect: Mood normal.        Behavior: Behavior normal.    Fetal Assessment 150 bpm, Mod Var, -Decels, +Accels Toco: No ctx graphed  MAU Course   Results for orders placed or performed during the hospital encounter of 07/28/21 (from the past 24 hour(s))  Urinalysis, Routine w reflex microscopic Urine, Clean Catch     Status: Abnormal   Collection Time: 07/28/21 10:37 AM  Result Value Ref Range   Color, Urine AMBER (A) YELLOW   APPearance CLOUDY (A) CLEAR   Specific Gravity, Urine 1.019 1.005 - 1.030   pH 7.0 5.0 - 8.0   Glucose, UA NEGATIVE NEGATIVE mg/dL   Hgb urine dipstick NEGATIVE NEGATIVE   Bilirubin Urine NEGATIVE NEGATIVE   Ketones, ur NEGATIVE NEGATIVE mg/dL   Protein, ur 30 (A) NEGATIVE mg/dL   Nitrite NEGATIVE NEGATIVE   Leukocytes,Ua LARGE (A) NEGATIVE   RBC / HPF 6-10 0 - 5 RBC/hpf   WBC, UA 11-20 0 - 5 WBC/hpf   Bacteria, UA RARE (A) NONE SEEN   Squamous Epithelial / LPF 21-50 0 - 5   Mucus PRESENT    No results found.  MDM PE Labs: Covid, UA EFM Pain medication Assessment and Plan  25 year old [id number]  SIUP at 34.5 weeks Cat I FT Flu-Like Symptoms  -POC discussed -Covid collected and pending -Exam performed and findings discussed. -Informed that covid results take 2-6 hours to return and will be released to mychart. -Patient states  she odes not have access.  PW reset by provider. -UA pending  Maryann Conners MSN, CNM 07/28/2021, 11:09 AM   Reassessment (12:03 PM)  -Patient reports improvement in back pain with heat and tylenol. -Also  reports able to access mychart.  -UA returns without significant findings.  -Medications safe for pregnancy discussed. -will send prescription for tylenol, Robitussin, and lozenges.  -Encouraged to hydrate while at home. -Encouraged to call primary office or return to MAU if symptoms worsen or with the onset of new symptoms. -Discharged to home in stable condition.   Maryann Conners MSN, CNM Advanced Practice Provider, [facility] 5:10 PM Influenza returns positive Message sent to patient via mychart.  Maryann Conners MSN, CNM Advanced Practice Provider, [facility] for [employer] [facility]

## 2021-08-16 ENCOUNTER — Other Ambulatory Visit: Payer: Self-pay | Admitting: Family Medicine

## 2021-08-16 DIAGNOSIS — Z3480 Encounter for supervision of other normal pregnancy, unspecified trimester: Secondary | ICD-10-CM

## 2021-08-17 ENCOUNTER — Other Ambulatory Visit: Payer: Self-pay

## 2021-08-17 ENCOUNTER — Ambulatory Visit (INDEPENDENT_AMBULATORY_CARE_PROVIDER_SITE_OTHER): Payer: Medicaid Other | Admitting: Family Medicine

## 2021-08-17 ENCOUNTER — Other Ambulatory Visit: Payer: Medicaid Other

## 2021-08-17 ENCOUNTER — Other Ambulatory Visit (HOSPITAL_COMMUNITY)
Admission: RE | Admit: 2021-08-17 | Discharge: 2021-08-17 | Disposition: A | Discharge status: Home / Self Care | Payer: Medicaid Other | Source: Ambulatory Visit | Attending: Family Medicine | Admitting: Family Medicine

## 2021-08-17 VITALS — BP 121/75 | HR 90 | Wt 193.0 lb

## 2021-08-17 DIAGNOSIS — Z3480 Encounter for supervision of other normal pregnancy, unspecified trimester: Secondary | ICD-10-CM | POA: Insufficient documentation

## 2021-08-17 DIAGNOSIS — O0933 Supervision of pregnancy with insufficient antenatal care, third trimester: Secondary | ICD-10-CM

## 2021-08-17 DIAGNOSIS — Z3A37 37 weeks gestation of pregnancy: Secondary | ICD-10-CM

## 2021-08-17 DIAGNOSIS — O368130 Decreased fetal movements, third trimester, not applicable or unspecified: Secondary | ICD-10-CM

## 2021-08-17 DIAGNOSIS — Z23 Encounter for immunization: Secondary | ICD-10-CM | POA: Diagnosis not present

## 2021-08-17 DIAGNOSIS — O0930 Supervision of pregnancy with insufficient antenatal care, unspecified trimester: Secondary | ICD-10-CM

## 2021-08-17 LAB — OB RESULTS CONSOLE GC/CHLAMYDIA: Gonorrhea: NEGATIVE

## 2021-08-17 NOTE — Progress Notes (Addendum)
ROB/GTT/GBS.  TDAP given in LD, tolerated well.

## 2021-08-17 NOTE — Progress Notes (Addendum)
Subjective:  Pamela Jefferson is a 25 y.o. [phone] at [redacted]w[redacted]d being seen today for ongoing prenatal care.  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  Of note, patient's last visit was on 06/17/2021. She reports she did not get seen since then as she did not feel well and did not feel like getting out of bed. Reports currently depression symptoms have improved and she denies any thoughts of harming self or others.  Patient reports  she has not felt baby move as much the last few days as before . Did not know about kick counts but discussed them today. She had a family member who had a stillborn child so she is concerned about something being wrong with her baby.  Contractions: Not present. Vag. Bleeding: None.  Movement: Present. Denies leaking of fluid.   The following portions of the patient's history were reviewed and updated as appropriate: allergies, current medications, past family history, past medical history, past social history, past surgical history and problem list. Problem list updated.  Objective:   Vitals:   08/17/21 0838  BP: 121/75  Pulse: 90  Weight: 193 lb (87.5 kg)    Fetal Status: Fetal Heart Rate (bpm): 139   Movement: Present     NST done in office: Reactive with following findings- baseline HR 130s, with 6 15x15 accels and no decels  No contractions  General:  Alert, oriented and cooperative. Patient is in no acute distress.  Skin: Skin is warm and dry. No rash noted.   Cardiovascular: Normal heart rate noted  Respiratory: Normal  respiratory effort, no problems with respiration noted  Abdomen: Soft, gravid, appropriate for gestational age. Pain/Pressure: Absent     Pelvic: Vag. Bleeding: None     Cervical exam performed      1 cm dilated/30%effaced/-3 station  Extremities: Normal range of motion.  Edema: None  Mental Status: Normal mood and affect. Normal behavior. Normal judgment and thought content.    Assessment and Plan:  Pregnancy: [id number] at [redacted]w[redacted]d  1. Supervision of other normal pregnancy, antepartum Did not have GTT or other 28 week labs yet. Will get today as patient is fasting. Also doing swabs for GBS and GC/CT - Culture, beta strep (group b only) - Cervicovaginal ancillary only( ) - Glucose Tolerance, 2 Hours w/1 Hour - RPR - CBC - HIV antibody (with reflex)  2. [redacted] weeks gestation of pregnancy - Culture, beta strep (group b only) - Cervicovaginal ancillary only( )  3. Limited prenatal care, unspecified trimester  4. Decreased fetal movement NST done in office after patient took glucola drink for GTT. NST reactive with baseline HR 130s, with 6 15x15 accels and no decels  - discussed kick counts and MAU precautions  5. Fundal height less than dates Fundal height borderline at 35cm. Given limited care will send for growth [place]  Term labor symptoms and general obstetric precautions including but not limited to vaginal bleeding, contractions, leaking of fluid and fetal movement were reviewed in detail with the patient. Please refer to After Visit Summary for other counseling recommendations.  Return in about 1 week (around 08/24/2021) for ROB, w/Dr. Clearance Coots if possible.  Warner Mccreedy, MD, MPH OB Fellow, Faculty [facility]

## 2021-08-18 LAB — GLUCOSE TOLERANCE, 2 HOURS W/ 1HR
Glucose, 1 hour: 114 mg/dL (ref 70–179)
Glucose, 2 hour: 70 mg/dL (ref 70–152)
Glucose, Fasting: 76 mg/dL (ref 70–91)

## 2021-08-18 LAB — RPR: RPR Ser Ql: NONREACTIVE

## 2021-08-18 LAB — CBC
Hematocrit: 29.6 % — ABNORMAL LOW (ref 34.0–46.6)
Hemoglobin: 10.1 g/dL — ABNORMAL LOW (ref 11.1–15.9)
MCH: 31.3 pg (ref 26.6–33.0)
MCHC: 34.1 g/dL (ref 31.5–35.7)
MCV: 92 fL (ref 79–97)
Platelets: 403 10*3/uL (ref 150–450)
RBC: 3.23 x10E6/uL — ABNORMAL LOW (ref 3.77–5.28)
RDW: 11.9 % (ref 11.7–15.4)
WBC: 9.9 10*3/uL (ref 3.4–10.8)

## 2021-08-18 LAB — HIV ANTIBODY (ROUTINE TESTING W REFLEX): HIV Screen 4th Generation wRfx: NONREACTIVE

## 2021-08-18 LAB — CERVICOVAGINAL ANCILLARY ONLY
Bacterial Vaginitis (gardnerella): POSITIVE — AB
Candida Glabrata: NEGATIVE
Candida Vaginitis: POSITIVE — AB
Chlamydia: NEGATIVE
Comment: NEGATIVE
Comment: NEGATIVE
Comment: NEGATIVE
Comment: NEGATIVE
Comment: NEGATIVE
Comment: NORMAL
Neisseria Gonorrhea: NEGATIVE
Trichomonas: NEGATIVE

## 2021-08-21 ENCOUNTER — Other Ambulatory Visit: Payer: Self-pay | Admitting: Family Medicine

## 2021-08-21 ENCOUNTER — Encounter: Payer: Self-pay | Admitting: Family Medicine

## 2021-08-21 DIAGNOSIS — O99019 Anemia complicating pregnancy, unspecified trimester: Secondary | ICD-10-CM | POA: Insufficient documentation

## 2021-08-21 LAB — CULTURE, BETA STREP (GROUP B ONLY): Strep Gp B Culture: NEGATIVE

## 2021-08-21 MED ORDER — FERROUS SULFATE 325 (65 FE) MG PO TABS: 325.0000 mg | ORAL_TABLET | ORAL | 0 refills | Status: DC

## 2021-08-21 MED ORDER — TERCONAZOLE 0.8 % VA CREA: 1.0000 | TOPICAL_CREAM | Freq: Every day | VAGINAL | 0 refills | Status: AC

## 2021-08-21 MED ORDER — METRONIDAZOLE 1 % EX GEL: Freq: Every day | CUTANEOUS | 0 refills | Status: AC

## 2021-08-24 ENCOUNTER — Ambulatory Visit (INDEPENDENT_AMBULATORY_CARE_PROVIDER_SITE_OTHER): Payer: Medicaid Other | Admitting: Obstetrics

## 2021-08-24 ENCOUNTER — Encounter: Payer: Self-pay | Admitting: Obstetrics

## 2021-08-24 ENCOUNTER — Other Ambulatory Visit: Payer: Self-pay

## 2021-08-24 VITALS — BP 133/81 | HR 97 | Wt 199.0 lb

## 2021-08-24 DIAGNOSIS — Z3480 Encounter for supervision of other normal pregnancy, unspecified trimester: Secondary | ICD-10-CM

## 2021-08-24 NOTE — Progress Notes (Signed)
   Patient in clinic for ROB [redacted]w[redacted]d. Complaints of pelvic pressure, irregular contractions, milky white discharge-no odor, itching or burning and swelling of feet. Positive fetal movement.   Clovis Pu, RN

## 2021-08-24 NOTE — Progress Notes (Signed)
Subjective:  Pamela Jefferson is a 25 y.o. [phone] at [redacted]w[redacted]d being seen today for ongoing prenatal care.  She is currently monitored for the following issues for this low-risk pregnancy and has Severe major depression without psychotic features (HCC); MDD (major depressive disorder), single episode, severe (HCC); Depression; Suicidal ideation; Suicidal ideations; Preterm labor; Abdominal pain; Round ligament pain; Pregnancy complicated by umbilical cord varix, antepartum, not applicable or unspecified fetus; Supervision of other normal pregnancy, antepartum; and Anemia of pregnancy on their problem list.  Patient reports no complaints.  Contractions: Irregular. Vag. Bleeding: None.  Movement: Present. Denies leaking of fluid.   The following portions of the patient's history were reviewed and updated as appropriate: allergies, current medications, past family history, past medical history, past social history, past surgical history and problem list. Problem list updated.  Objective:   Vitals:   08/24/21 0836  BP: 133/81  Pulse: 97  Weight: 199 lb (90.3 kg)    Fetal Status:     Movement: Present     General:  Alert, oriented and cooperative. Patient is in no acute distress.  Skin: Skin is warm and dry. No rash noted.   Cardiovascular: Normal heart rate noted  Respiratory: Normal respiratory effort, no problems with respiration noted  Abdomen: Soft, gravid, appropriate for gestational age. Pain/Pressure: Present     Pelvic:  Cervical exam deferred        Extremities: Normal range of motion.  Edema: Trace  Mental Status: Normal mood and affect. Normal behavior. Normal judgment and thought content.   Urinalysis:      Assessment and Plan:  Pregnancy: [id number] at [redacted]w[redacted]d  There are no diagnoses linked to this encounter. Term labor symptoms and general obstetric precautions including but not limited to vaginal bleeding, contractions, leaking of fluid and fetal movement were reviewed in detail  with the patient. Please refer to After Visit Summary for other counseling recommendations.   Return in about 1 week (around 08/31/2021) for ROB.   Brock Bad, MD  08/24/21

## 2021-08-27 ENCOUNTER — Ambulatory Visit: Payer: Medicaid Other | Attending: Family Medicine

## 2021-08-27 ENCOUNTER — Other Ambulatory Visit: Payer: Self-pay

## 2021-08-27 ENCOUNTER — Encounter: Payer: Self-pay | Admitting: *Deleted

## 2021-08-27 ENCOUNTER — Ambulatory Visit: Payer: Medicaid Other | Admitting: *Deleted

## 2021-08-27 VITALS — BP 126/63 | HR 86

## 2021-08-27 DIAGNOSIS — Z3480 Encounter for supervision of other normal pregnancy, unspecified trimester: Secondary | ICD-10-CM | POA: Insufficient documentation

## 2021-08-27 DIAGNOSIS — Z3A37 37 weeks gestation of pregnancy: Secondary | ICD-10-CM | POA: Insufficient documentation

## 2021-08-31 ENCOUNTER — Other Ambulatory Visit: Payer: Self-pay

## 2021-08-31 ENCOUNTER — Other Ambulatory Visit: Payer: Self-pay | Admitting: Women's Health

## 2021-08-31 ENCOUNTER — Other Ambulatory Visit: Payer: Self-pay | Admitting: Obstetrics and Gynecology

## 2021-08-31 ENCOUNTER — Ambulatory Visit (INDEPENDENT_AMBULATORY_CARE_PROVIDER_SITE_OTHER): Payer: Medicaid Other | Admitting: Obstetrics and Gynecology

## 2021-08-31 VITALS — BP 123/75 | HR 84 | Wt 199.8 lb

## 2021-08-31 DIAGNOSIS — Z3480 Encounter for supervision of other normal pregnancy, unspecified trimester: Secondary | ICD-10-CM

## 2021-08-31 DIAGNOSIS — O99013 Anemia complicating pregnancy, third trimester: Secondary | ICD-10-CM

## 2021-08-31 NOTE — Progress Notes (Signed)
   PRENATAL VISIT NOTE  Subjective:  Pamela Jefferson is a 25 y.o. [phone] at 102w4d being seen today for ongoing prenatal care.  She is currently monitored for the following issues for this low-risk pregnancy and has Depression; Suicidal ideation; Supervision of other normal pregnancy, antepartum; and Anemia of pregnancy on their problem list.  Patient reports no complaints.  Contractions: Regular. Vag. Bleeding: None.  Movement: Present. Denies leaking of fluid.   The following portions of the patient's history were reviewed and updated as appropriate: allergies, current medications, past family history, past medical history, past social history, past surgical history and problem list.   Objective:   Vitals:   08/31/21 0853  BP: 123/75  Pulse: 84  Weight: 199 lb 12.8 oz (90.6 kg)    Fetal Status: Fetal Heart Rate (bpm): 135 Fundal Height: 37 cm Movement: Present     General:  Alert, oriented and cooperative. Patient is in no acute distress.  Skin: Skin is warm and dry. No rash noted.   Cardiovascular: Normal heart rate noted  Respiratory: Normal respiratory effort, no problems with respiration noted  Abdomen: Soft, gravid, appropriate for gestational age.  Pain/Pressure: Present     Pelvic: Cervical exam deferred        Extremities: Normal range of motion.     Mental Status: Normal mood and affect. Normal behavior. Normal judgment and thought content.   Assessment and Plan:  Pregnancy: [id number] at [redacted]w[redacted]d 1. Anemia during pregnancy in third trimester Continue PO Iron, last hgb was 10.1  2. Supervision of other normal pregnancy, antepartum Discussing timing of delivery. Discussed options for IOL vs labor. She would like IOL at any time. Will scheduled for 12/15.   Term labor symptoms and general obstetric precautions including but not limited to vaginal bleeding, contractions, leaking of fluid and fetal movement were reviewed in detail with the patient. Please refer to After Visit  Summary for other counseling recommendations.   No follow-ups on file.  No future appointments.   Milas Hock, MD

## 2021-09-02 ENCOUNTER — Other Ambulatory Visit: Payer: Self-pay

## 2021-09-02 ENCOUNTER — Inpatient Hospital Stay (HOSPITAL_COMMUNITY): Payer: Medicaid Other | Admitting: Anesthesiology

## 2021-09-02 ENCOUNTER — Encounter (HOSPITAL_COMMUNITY): Payer: Self-pay | Admitting: Obstetrics and Gynecology

## 2021-09-02 ENCOUNTER — Inpatient Hospital Stay (HOSPITAL_COMMUNITY): Payer: Medicaid Other

## 2021-09-02 ENCOUNTER — Inpatient Hospital Stay (HOSPITAL_COMMUNITY)
Admission: AD | Admit: 2021-09-02 | Discharge: 2021-09-04 | DRG: 807 | Disposition: A | Discharge status: Home / Self Care | Payer: Medicaid Other | Attending: Obstetrics and Gynecology | Admitting: Obstetrics and Gynecology

## 2021-09-02 DIAGNOSIS — O9902 Anemia complicating childbirth: Secondary | ICD-10-CM | POA: Diagnosis not present

## 2021-09-02 DIAGNOSIS — F17210 Nicotine dependence, cigarettes, uncomplicated: Secondary | ICD-10-CM | POA: Diagnosis not present

## 2021-09-02 DIAGNOSIS — D509 Iron deficiency anemia, unspecified: Secondary | ICD-10-CM | POA: Diagnosis present

## 2021-09-02 DIAGNOSIS — O99019 Anemia complicating pregnancy, unspecified trimester: Secondary | ICD-10-CM | POA: Diagnosis present

## 2021-09-02 DIAGNOSIS — O99334 Smoking (tobacco) complicating childbirth: Secondary | ICD-10-CM | POA: Diagnosis present

## 2021-09-02 DIAGNOSIS — Z3A40 40 weeks gestation of pregnancy: Secondary | ICD-10-CM | POA: Diagnosis not present

## 2021-09-02 DIAGNOSIS — Z3480 Encounter for supervision of other normal pregnancy, unspecified trimester: Secondary | ICD-10-CM

## 2021-09-02 DIAGNOSIS — Z20822 Contact with and (suspected) exposure to covid-19: Secondary | ICD-10-CM | POA: Diagnosis present

## 2021-09-02 DIAGNOSIS — D649 Anemia, unspecified: Secondary | ICD-10-CM | POA: Diagnosis not present

## 2021-09-02 DIAGNOSIS — O26893 Other specified pregnancy related conditions, third trimester: Secondary | ICD-10-CM | POA: Diagnosis not present

## 2021-09-02 DIAGNOSIS — Z3A39 39 weeks gestation of pregnancy: Secondary | ICD-10-CM

## 2021-09-02 DIAGNOSIS — Z3490 Encounter for supervision of normal pregnancy, unspecified, unspecified trimester: Secondary | ICD-10-CM

## 2021-09-02 LAB — CBC
HCT: 31.7 % — ABNORMAL LOW (ref 36.0–46.0)
Hemoglobin: 10.4 g/dL — ABNORMAL LOW (ref 12.0–15.0)
MCH: 31.7 pg (ref 26.0–34.0)
MCHC: 32.8 g/dL (ref 30.0–36.0)
MCV: 96.6 fL (ref 80.0–100.0)
Platelets: 330 10*3/uL (ref 150–400)
RBC: 3.28 MIL/uL — ABNORMAL LOW (ref 3.87–5.11)
RDW: 13.4 % (ref 11.5–15.5)
WBC: 13.1 10*3/uL — ABNORMAL HIGH (ref 4.0–10.5)
nRBC: 0 % (ref 0.0–0.2)

## 2021-09-02 LAB — TYPE AND SCREEN
ABO/RH(D): B POS
Antibody Screen: NEGATIVE

## 2021-09-02 LAB — RESP PANEL BY RT-PCR (FLU A&B, COVID) ARPGX2
Influenza A by PCR: NEGATIVE
Influenza B by PCR: NEGATIVE
SARS Coronavirus 2 by RT PCR: NEGATIVE

## 2021-09-02 MED ORDER — SOD CITRATE-CITRIC ACID 500-334 MG/5ML PO SOLN: 30.0000 mL | ORAL | Status: DC | PRN

## 2021-09-02 MED ORDER — PHENYLEPHRINE 40 MCG/ML (10ML) SYRINGE FOR IV PUSH (FOR BLOOD PRESSURE SUPPORT): 80.0000 ug | PREFILLED_SYRINGE | INTRAVENOUS | Status: DC | PRN

## 2021-09-02 MED ORDER — OXYTOCIN-SODIUM CHLORIDE 30-0.9 UT/500ML-% IV SOLN
2.5000 [IU]/h | INTRAVENOUS | Status: DC
  Filled 2021-09-02: qty 500

## 2021-09-02 MED ORDER — EPHEDRINE 5 MG/ML INJ: 10.0000 mg | INTRAVENOUS | Status: DC | PRN

## 2021-09-02 MED ORDER — OXYTOCIN BOLUS FROM INFUSION
333.0000 mL | Freq: Once | INTRAVENOUS | Status: DC
  Administered 2021-09-03: 333 mL via INTRAVENOUS

## 2021-09-02 MED ORDER — TERBUTALINE SULFATE 1 MG/ML IJ SOLN
0.2500 mg | Freq: Once | INTRAMUSCULAR | Status: AC | PRN
  Administered 2021-09-02: 0.25 mg via SUBCUTANEOUS
  Filled 2021-09-02: qty 1

## 2021-09-02 MED ORDER — LACTATED RINGERS IV SOLN
500.0000 mL | INTRAVENOUS | Status: DC | PRN
  Administered 2021-09-02: 500 mL via INTRAVENOUS

## 2021-09-02 MED ORDER — ACETAMINOPHEN 325 MG PO TABS: 650.0000 mg | ORAL_TABLET | ORAL | Status: DC | PRN

## 2021-09-02 MED ORDER — OXYCODONE-ACETAMINOPHEN 5-325 MG PO TABS: 2.0000 | ORAL_TABLET | ORAL | Status: DC | PRN

## 2021-09-02 MED ORDER — LACTATED RINGERS AMNIOINFUSION: INTRAVENOUS | Status: DC

## 2021-09-02 MED ORDER — LIDOCAINE HCL (PF) 1 % IJ SOLN
INTRAMUSCULAR | Status: DC | PRN
  Administered 2021-09-02: 2 mL via EPIDURAL
  Administered 2021-09-02: 10 mL via EPIDURAL

## 2021-09-02 MED ORDER — FENTANYL-BUPIVACAINE-NACL 0.5-0.125-0.9 MG/250ML-% EP SOLN
EPIDURAL | Status: DC | PRN
  Administered 2021-09-02: 12 mL/h via EPIDURAL

## 2021-09-02 MED ORDER — MISOPROSTOL 50MCG HALF TABLET
50.0000 ug | ORAL_TABLET | ORAL | Status: DC | PRN
  Administered 2021-09-02: 50 ug via BUCCAL
  Filled 2021-09-02: qty 1

## 2021-09-02 MED ORDER — OXYTOCIN-SODIUM CHLORIDE 30-0.9 UT/500ML-% IV SOLN
1.0000 m[IU]/min | INTRAVENOUS | Status: DC
  Administered 2021-09-02: 2 m[IU]/min via INTRAVENOUS

## 2021-09-02 MED ORDER — LIDOCAINE HCL (PF) 1 % IJ SOLN: 30.0000 mL | INTRAMUSCULAR | Status: DC | PRN

## 2021-09-02 MED ORDER — LACTATED RINGERS IV SOLN: INTRAVENOUS | Status: DC

## 2021-09-02 MED ORDER — LACTATED RINGERS IV SOLN
500.0000 mL | Freq: Once | INTRAVENOUS | Status: AC
  Administered 2021-09-02: 500 mL via INTRAVENOUS

## 2021-09-02 MED ORDER — DIPHENHYDRAMINE HCL 50 MG/ML IJ SOLN: 12.5000 mg | INTRAMUSCULAR | Status: DC | PRN

## 2021-09-02 MED ORDER — ONDANSETRON HCL 4 MG/2ML IJ SOLN: 4.0000 mg | Freq: Four times a day (QID) | INTRAMUSCULAR | Status: DC | PRN

## 2021-09-02 MED ORDER — FENTANYL-BUPIVACAINE-NACL 0.5-0.125-0.9 MG/250ML-% EP SOLN
12.0000 mL/h | EPIDURAL | Status: DC | PRN
  Filled 2021-09-02: qty 250

## 2021-09-02 MED ORDER — FENTANYL CITRATE (PF) 100 MCG/2ML IJ SOLN
50.0000 ug | INTRAMUSCULAR | Status: DC | PRN
Start: 2021-09-02 — End: 2021-09-03
  Administered 2021-09-02 (×3): 100 ug via INTRAVENOUS
  Filled 2021-09-02 (×3): qty 2

## 2021-09-02 MED ORDER — OXYCODONE-ACETAMINOPHEN 5-325 MG PO TABS: 1.0000 | ORAL_TABLET | ORAL | Status: DC | PRN

## 2021-09-02 NOTE — H&P (Signed)
OBSTETRIC ADMISSION HISTORY AND PHYSICAL  Pamela Jefferson is a 25 y.o. female [phone] with IUP at [redacted]w[redacted]d by LMP presenting for eIOL. She reports +FMs, no LOF, no VB, no blurry vision, headaches, peripheral edema, or RUQ pain.  She plans on formula feeding. She is undecided about what she would like to do for birth control postpartum.   She received her prenatal care at [facility].   Dating: By LMP --->  Estimated Date of Delivery: 09/03/21  Sono:   @[redacted]w[redacted]d , CWD, normal anatomy, cephalic presentation, posterior placental lie, 3668 g, 70% EFW  Prenatal History/Complications:  Limited prenatal care Iron deficiency anemia (on PO iron) Hx of depression and anxiety (no meds, controlled)   Past Medical History: Past Medical History:  Diagnosis Date   Anemia    Anxiety    Chlamydia    Depression    "about ready to give up:", no help, just tired   Gonorrhea    Hx of gastric ulcer     Past Surgical History: Past Surgical History:  Procedure Laterality Date   FRACTURE SURGERY     left arm    Obstetrical History: OB History     Gravida  4   Para  2   Term  2   Preterm      AB  1   Living  2      SAB  1   IAB      Ectopic      Multiple  0   Live Births  2           Social History Social History   Socioeconomic History   Marital status: Single    Spouse name: Not on file   Number of children: 2   Years of education: Not on file   Highest education level: 12th grade  Occupational History   Not on file  Tobacco Use   Smoking status: Every Day    Packs/day: 0.25    Years: 4.00    Pack years: 1.00    Types: Cigarettes   Smokeless tobacco: Never  Vaping Use   Vaping Use: Never used  Substance and Sexual Activity   Alcohol use: No   Drug use: No   Sexual activity: Not Currently    Birth control/protection: None  Other Topics Concern   Not on file  Social History Narrative   Not on file   Social Determinants of Health   Financial Resource  Strain: Not on file  Food Insecurity: Not on file  Transportation Needs: Not on file  Physical Activity: Not on file  Stress: Not on file  Social Connections: Not on file    Family History: Family History  Problem Relation Age of Onset   Neurologic Disorder Mother    Hip fracture Maternal Grandmother    Cancer Maternal Grandfather     Allergies: Allergies  Allergen Reactions   Shellfish Allergy Anaphylaxis    No medications prior to admission.     Review of Systems  All systems reviewed and negative except as stated in HPI  Blood pressure (!) 114/58, pulse 96, temperature 97.7 F (36.5 C), temperature source Oral, resp. rate 18, height 5\' 8"  (1.727 m), weight 89.9 kg, last menstrual period 11/27/2020, unknown if currently breastfeeding.  General appearance: alert, cooperative, and no distress Lungs: normal work of breathing on room air  Heart: normal rate, warm and well perfused  Abdomen: soft, non-tender, gravid  Extremities: no LE edema or calf tenderness to palpation  Presentation:  Cephalic Fetal monitoring: Baseline 140 bpm, moderate variability, + accels, no decels  Uterine activity: Occasional Dilation: 2 Effacement (%): 50 Station: -3 Exam by:: Dr. Mathis Fare   Prenatal labs: ABO, Rh: --/--/B POS (12/15 1145) Antibody: NEG (12/15 1145) Rubella: 1.65 (08/12 1101) RPR: Non Reactive (11/29 1012)  HBsAg: Negative (08/12 1101)  HIV: Non Reactive (11/29 1012)  GBS: Negative/-- (11/29 1019)  2 hr Glucola normal  Genetic screening - LR NIPS, Horizon neg Anatomy US normal   Prenatal Transfer Tool  Maternal Diabetes: No Genetic Screening: Normal Maternal Ultrasounds/Referrals: Normal Fetal Ultrasounds or other Referrals:  None Maternal Substance Abuse:  Yes:  Type: Smoker (tobacco) Significant Maternal Medications:  None Significant Maternal Lab Results: Group B Strep negative  Results for orders placed or performed during the hospital encounter of  09/02/21 (from the past 24 hour(s))  Type and screen   Collection Time: 09/02/21 11:45 AM  Result Value Ref Range   ABO/RH(D) B POS    Antibody Screen NEG    Sample Expiration      09/05/2021,2359 Performed at [facility], [address] [place], [place] [address]   CBC   Collection Time: 09/02/21 12:00 PM  Result Value Ref Range   WBC 13.1 (H) 4.0 - 10.5 K/uL   RBC 3.28 (L) 3.87 - 5.11 MIL/uL   Hemoglobin 10.4 (L) 12.0 - 15.0 g/dL   HCT 78.2 (L) 42.3 - 53.6 %   MCV 96.6 80.0 - 100.0 fL   MCH 31.7 26.0 - 34.0 pg   MCHC 32.8 30.0 - 36.0 g/dL   RDW 14.4 31.5 - 40.0 %   Platelets 330 150 - 400 K/uL   nRBC 0.0 0.0 - 0.2 %  Resp Panel by RT-PCR (Flu A&B, Covid) Nasopharyngeal Swab   Collection Time: 09/02/21 12:05 PM   Specimen: Nasopharyngeal Swab; Nasopharyngeal(NP) swabs in vial transport medium  Result Value Ref Range   SARS Coronavirus 2 by RT PCR NEGATIVE NEGATIVE   Influenza A by PCR NEGATIVE NEGATIVE   Influenza B by PCR NEGATIVE NEGATIVE    Patient Active Problem List   Diagnosis Date Noted   Pregnancy 09/02/2021   Anemia of pregnancy 08/21/2021   Supervision of other normal pregnancy, antepartum 04/22/2021   Depression    Suicidal ideation     Assessment/Plan:  Pamela Jefferson is a 25 y.o. [id number] at [redacted]w[redacted]d here for eIOL.   #Labor: Induction started with foley balloon placement and buccal Cytotec. Mom and baby tolerated FB placement well. Will reassess in 4 hours.  #Pain: PRN, planning for epidural when ready #FWB: Cat 1 #ID:  GBS neg #MOF: Formula  #MOC: Undecided  #Iron deficiency anemia: Hemoglobin on admit 10.4.   Worthy Rancher, MD  09/02/2021, 1:51 PM

## 2021-09-02 NOTE — Anesthesia Procedure Notes (Signed)
Epidural Patient location during procedure: OB Start time: 09/02/2021 8:21 PM End time: 09/02/2021 8:29 PM  Staffing Anesthesiologist: Lannie Fields, DO Performed: anesthesiologist   Preanesthetic Checklist Completed: patient identified, IV checked, risks and benefits discussed, monitors and equipment checked, pre-op evaluation and timeout performed  Epidural Patient position: sitting Prep: DuraPrep and site prepped and draped Patient monitoring: continuous pulse ox, blood pressure, heart rate and cardiac monitor Approach: midline Location: L3-L4 Injection technique: LOR air  Needle:  Needle type: Tuohy  Needle gauge: 17 G Needle length: 9 cm Needle insertion depth: 5 cm Catheter type: closed end flexible Catheter size: 19 Gauge Catheter at skin depth: 10 cm Test dose: negative  Assessment Sensory level: T8 Events: blood not aspirated, injection not painful, no injection resistance, no paresthesia and negative IV test  Additional Notes Patient identified. Risks/Benefits/Options discussed with patient including but not limited to bleeding, infection, nerve damage, paralysis, failed block, incomplete pain control, headache, blood pressure changes, nausea, vomiting, reactions to medication both or allergic, itching and postpartum back pain. Confirmed with bedside nurse the patient's most recent platelet count. Confirmed with patient that they are not currently taking any anticoagulation, have any bleeding history or any family history of bleeding disorders. Patient expressed understanding and wished to proceed. All questions were answered. Sterile technique was used throughout the entire procedure. Please see nursing notes for vital signs. Test dose was given through epidural catheter and negative prior to continuing to dose epidural or start infusion. Warning signs of high block given to the patient including shortness of breath, tingling/numbness in hands, complete motor  block, or any concerning symptoms with instructions to call for help. Patient was given instructions on fall risk and not to get out of bed. All questions and concerns addressed with instructions to call with any issues or inadequate analgesia.  Reason for block:procedure for pain

## 2021-09-02 NOTE — Progress Notes (Signed)
Labor Progress Note Pamela Jefferson is a 25 y.o. [id number] at [redacted]w[redacted]d who presented for eIOL.  S: Doing well. No concerns at this time. Feeling her contractions, more painful. Foley balloon came out ~1730.   O:  BP 116/69    Pulse 84    Temp 97.8 F (36.6 C) (Oral)    Resp 16    Ht 5\' 8"  (1.727 m)    Wt 89.9 kg    LMP 11/27/2020 (Exact Date)    BMI 30.14 kg/m   EFM: Baseline 130 bpm, moderate variability, + accels, no decels  Toco: Every 2-5 minutes   CVE: Dilation: 4.5 Effacement (%): 50 Station: -2 Presentation: Vertex Exam by:: k fields, rn   A&P: 25 y.o. 25 [redacted]w[redacted]d   #Labor: Progressing well s/p Cytotec and foley balloon placement. Will start Pitocin 2x2. Confirmed vertex via BSUS. Plan for AROM once more comfortable with epidural.  #Pain: Requesting epidural, will notify anesthesia  #FWB: Cat 1  #GBS negative #Anticipated MOD: SVD  [redacted]w[redacted]d, MD 6:10 PM

## 2021-09-02 NOTE — Anesthesia Preprocedure Evaluation (Signed)
Anesthesia Evaluation  Patient identified by MRN, date of birth, ID band Patient awake    Reviewed: Allergy & Precautions, Patient's Chart, lab work & pertinent test results  Airway Mallampati: II  TM Distance: >3 FB Neck ROM: Full    Dental no notable dental hx.    Pulmonary Current Smoker,    Pulmonary exam normal breath sounds clear to auscultation       Cardiovascular negative cardio ROS Normal cardiovascular exam Rhythm:Regular Rate:Normal     Neuro/Psych PSYCHIATRIC DISORDERS Anxiety Depression negative neurological ROS     GI/Hepatic Neg liver ROS, PUD,   Endo/Other  negative endocrine ROS  Renal/GU negative Renal ROS  negative genitourinary   Musculoskeletal negative musculoskeletal ROS (+)   Abdominal   Peds  Hematology  (+) Blood dyscrasia, anemia , hct 31.7, plt 330   Anesthesia Other Findings   Reproductive/Obstetrics (+) Pregnancy                             Anesthesia Physical Anesthesia Plan  ASA: 2  Anesthesia Plan: Epidural   Post-op Pain Management:    Induction:   PONV Risk Score and Plan: 2  Airway Management Planned: Natural Airway  Additional Equipment: None  Intra-op Plan:   Post-operative Plan:   Informed Consent: I have reviewed the patients History and Physical, chart, labs and discussed the procedure including the risks, benefits and alternatives for the proposed anesthesia with the patient or authorized representative who has indicated his/her understanding and acceptance.       Plan Discussed with:   Anesthesia Plan Comments:         Anesthesia Quick Evaluation

## 2021-09-02 NOTE — Progress Notes (Signed)
Patient ID: Pamela Jefferson, female   DOB: 1995-10-07, 25 y.o.   MRN: [id number] Just got epidural  Vitals:   09/02/21 2031 09/02/21 2035 09/02/21 2040 09/02/21 2045  BP: 116/74 118/72 119/75 122/74  Pulse: 81 77 80 82  Resp:      Temp:      TempSrc:      SpO2: 100% 100% 100% 100%  Weight:      Height:       FHR reassuring  Getting foley placed now so will AROM later

## 2021-09-02 NOTE — Progress Notes (Signed)
Patient ID: Pamela Jefferson, female   DOB: [birth year], 25 y.o.   MRN: [id number] Requesting to have membranes ruptured now  Vitals:   09/02/21 2040 09/02/21 2045 09/02/21 2057 09/02/21 2100  BP: 119/75 122/74 119/75 (!) 114/50  Pulse: 80 82 80 82  Resp:      Temp:      TempSrc:      SpO2: 100% 100% 100% 100%  Weight:      Height:       FHR reactive UCs irregular  Dilation: 4 Effacement (%): 60 Station: -3 Presentation: Vertex Exam by:: Artelia Laroche, CNM  AROM clear fluid. Anticipate increased labor

## 2021-09-03 ENCOUNTER — Encounter (HOSPITAL_COMMUNITY): Payer: Self-pay | Admitting: Obstetrics and Gynecology

## 2021-09-03 DIAGNOSIS — Z3A39 39 weeks gestation of pregnancy: Secondary | ICD-10-CM | POA: Diagnosis not present

## 2021-09-03 LAB — CBC
HCT: 26.3 % — ABNORMAL LOW (ref 36.0–46.0)
Hemoglobin: 8.8 g/dL — ABNORMAL LOW (ref 12.0–15.0)
MCH: 32.1 pg (ref 26.0–34.0)
MCHC: 33.5 g/dL (ref 30.0–36.0)
MCV: 96 fL (ref 80.0–100.0)
Platelets: 270 10*3/uL (ref 150–400)
RBC: 2.74 MIL/uL — ABNORMAL LOW (ref 3.87–5.11)
RDW: 13.4 % (ref 11.5–15.5)
WBC: 21.8 10*3/uL — ABNORMAL HIGH (ref 4.0–10.5)
nRBC: 0 % (ref 0.0–0.2)

## 2021-09-03 LAB — RPR: RPR Ser Ql: NONREACTIVE

## 2021-09-03 MED ORDER — KETOROLAC TROMETHAMINE 30 MG/ML IJ SOLN
30.0000 mg | Freq: Once | INTRAMUSCULAR | Status: DC
  Filled 2021-09-03: qty 1

## 2021-09-03 MED ORDER — PRENATAL MULTIVITAMIN CH
1.0000 | ORAL_TABLET | Freq: Every day | ORAL | Status: DC
  Administered 2021-09-03 – 2021-09-04 (×2): 1 via ORAL
  Filled 2021-09-03 (×2): qty 1

## 2021-09-03 MED ORDER — COCONUT OIL OIL: 1.0000 "application " | TOPICAL_OIL | Status: DC | PRN

## 2021-09-03 MED ORDER — ONDANSETRON HCL 4 MG PO TABS: 4.0000 mg | ORAL_TABLET | ORAL | Status: DC | PRN

## 2021-09-03 MED ORDER — DIBUCAINE (PERIANAL) 1 % EX OINT: 1.0000 "application " | TOPICAL_OINTMENT | CUTANEOUS | Status: DC | PRN

## 2021-09-03 MED ORDER — SIMETHICONE 80 MG PO CHEW: 80.0000 mg | CHEWABLE_TABLET | ORAL | Status: DC | PRN

## 2021-09-03 MED ORDER — WITCH HAZEL-GLYCERIN EX PADS: 1.0000 "application " | MEDICATED_PAD | CUTANEOUS | Status: DC | PRN

## 2021-09-03 MED ORDER — IBUPROFEN 600 MG PO TABS
600.0000 mg | ORAL_TABLET | Freq: Four times a day (QID) | ORAL | Status: DC
  Administered 2021-09-03 (×3): 600 mg via ORAL
  Filled 2021-09-03 (×3): qty 1

## 2021-09-03 MED ORDER — ACETAMINOPHEN 325 MG PO TABS
650.0000 mg | ORAL_TABLET | ORAL | Status: DC | PRN
  Administered 2021-09-03 (×2): 650 mg via ORAL
  Filled 2021-09-03 (×3): qty 2

## 2021-09-03 MED ORDER — ONDANSETRON HCL 4 MG/2ML IJ SOLN: 4.0000 mg | INTRAMUSCULAR | Status: DC | PRN

## 2021-09-03 MED ORDER — ZOLPIDEM TARTRATE 5 MG PO TABS: 5.0000 mg | ORAL_TABLET | Freq: Every evening | ORAL | Status: DC | PRN

## 2021-09-03 MED ORDER — SODIUM CHLORIDE 0.9 % IV SOLN
500.0000 mg | Freq: Once | INTRAVENOUS | Status: AC
  Administered 2021-09-03: 500 mg via INTRAVENOUS
  Filled 2021-09-03 (×2): qty 25

## 2021-09-03 MED ORDER — IBUPROFEN 600 MG PO TABS
600.0000 mg | ORAL_TABLET | Freq: Four times a day (QID) | ORAL | Status: DC
  Administered 2021-09-04 (×2): 600 mg via ORAL
  Filled 2021-09-03 (×2): qty 1

## 2021-09-03 MED ORDER — OXYCODONE HCL 5 MG PO TABS
5.0000 mg | ORAL_TABLET | Freq: Once | ORAL | Status: AC
  Administered 2021-09-03: 5 mg via ORAL
  Filled 2021-09-03: qty 1

## 2021-09-03 MED ORDER — ACETAMINOPHEN 500 MG PO TABS
1000.0000 mg | ORAL_TABLET | Freq: Four times a day (QID) | ORAL | Status: DC | PRN
  Administered 2021-09-04 (×2): 1000 mg via ORAL
  Filled 2021-09-03 (×2): qty 2

## 2021-09-03 MED ORDER — BENZOCAINE-MENTHOL 20-0.5 % EX AERO: 1.0000 "application " | INHALATION_SPRAY | CUTANEOUS | Status: DC | PRN

## 2021-09-03 MED ORDER — DIPHENHYDRAMINE HCL 25 MG PO CAPS: 25.0000 mg | ORAL_CAPSULE | Freq: Four times a day (QID) | ORAL | Status: DC | PRN

## 2021-09-03 MED ORDER — TETANUS-DIPHTH-ACELL PERTUSSIS 5-2.5-18.5 LF-MCG/0.5 IM SUSY: 0.5000 mL | PREFILLED_SYRINGE | Freq: Once | INTRAMUSCULAR | Status: DC

## 2021-09-03 MED ORDER — SENNOSIDES-DOCUSATE SODIUM 8.6-50 MG PO TABS
2.0000 | ORAL_TABLET | ORAL | Status: DC
  Administered 2021-09-03 – 2021-09-04 (×2): 2 via ORAL
  Filled 2021-09-03 (×2): qty 2

## 2021-09-03 NOTE — Anesthesia Postprocedure Evaluation (Signed)
Anesthesia Post Note  Patient: Pamela Jefferson  Procedure(s) Performed: AN AD HOC LABOR EPIDURAL     Patient location during evaluation: Mother Baby Anesthesia Type: Epidural Level of consciousness: awake and alert Pain management: pain level controlled Vital Signs Assessment: post-procedure vital signs reviewed and stable Respiratory status: spontaneous breathing, nonlabored ventilation and respiratory function stable Cardiovascular status: stable Postop Assessment: no headache, no backache, epidural receding, no apparent nausea or vomiting, patient able to bend at knees, adequate PO intake and able to ambulate Anesthetic complications: no   No notable events documented.  Last Vitals:  Vitals:   09/03/21 0400 09/03/21 0745  BP: (!) 100/54 123/70  Pulse: 83 62  Resp: 16 18  Temp: 37 C 36.8 C  SpO2: 100%     Last Pain:  Vitals:   09/03/21 0745  TempSrc: Oral  PainSc: 0-No pain   Pain Goal:                   [employer]
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
## 2021-09-03 NOTE — Social Work (Signed)
CSW acknowledged consult and attempted to meet with MOB. However, MOB requested to seen by social work tomorrow. Social Work will see MOB tomorrow.   Vivi Barrack, MSW, LCSW [facility]  Clinical Social Worker  [phone] 2020/10/07  2:34 PM

## 2021-09-03 NOTE — Discharge Summary (Signed)
Postpartum Discharge Summary     Patient Name: Pamela Jefferson DOB: 06-02-1996 MRN: [id number]  Date of admission: 09/02/2021 Delivery date:09/03/2021  Delivering provider: Seabron Spates  Date of discharge: 09/04/2021  Admitting diagnosis: Pregnancy [Z34.90] Intrauterine pregnancy: [redacted]w[redacted]d    Secondary diagnosis:  Principal Problem:   Pregnancy Active Problems:   Supervision of other normal pregnancy, antepartum   Anemia of pregnancy   Vaginal delivery  Additional problems: >5035mEBL (760)    Discharge diagnosis: Term Pregnancy Delivered                                              Post partum procedures: none Augmentation: AROM and Pitocin Foley and Cytotec Complications: None  Hospital course: Induction of Labor With Vaginal Delivery   [age].o. yo [id number] admitted to the hospital 09/02/2021 for induction of labor.  Indication for induction: Elective.  Patient had an uncomplicated labor course as follows: Membrane Rupture Time/Date: 9:31 PM ,09/02/2021   Delivery Method:Vaginal, Spontaneous  Episiotomy: None  Lacerations:  None  Details of delivery can be found in separate delivery note.  Patient had a routine postpartum course. Patient is discharged home 09/04/21.  Newborn Data: Birth date:09/03/2021  Birth time:1:01 AM  Gender:Female  Living status:Living  Apgars:9 ,9  Weight:3371 g   Magnesium Sulfate received: No BMZ received: No Rhophylac:N/A MMR:N/A T-DaP:Given prenatally Flu: No Transfusion:No  Physical exam  Vitals:   09/03/21 1145 09/03/21 1545 09/03/21 1950 09/04/21 0544  BP: 120/75 116/68 127/71 (!) 105/52  Pulse: 80 81 79   Resp: 17 18 18 18   Temp: 98.4 F (36.9 C) 98.4 F (36.9 C) 98.7 F (37.1 C) 98.4 F (36.9 C)  TempSrc: Oral Oral Oral Oral  SpO2:   100% 100%  Weight:      Height:       General: alert, cooperative, and no distress Lochia: appropriate Uterine Fundus: firm Incision: N/A DVT Evaluation: No  evidence of DVT seen on physical exam. Labs: Lab Results  Component Value Date   WBC 21.8 (H) 09/03/2021   HGB 8.8 (L) 09/03/2021   HCT 26.3 (L) 09/03/2021   MCV 96.0 09/03/2021   PLT 270 09/03/2021   CMP Latest Ref Rng & Units 01/23/2018  Glucose 65 - 99 mg/dL 93  BUN 6 - 20 mg/dL 9  Creatinine 0.44 - 1.00 mg/dL 0.48  Sodium 135 - 145 mmol/L 136  Potassium 3.5 - 5.1 mmol/L 3.4(L)  Chloride 101 - 111 mmol/L 106  CO2 22 - 32 mmol/L 21(L)  Calcium 8.9 - 10.3 mg/dL 8.4(L)  Total Protein 6.5 - 8.1 g/dL 6.9  Total Bilirubin 0.3 - 1.2 mg/dL 0.2(L)  Alkaline Phos 38 - 126 U/L 73  AST 15 - 41 U/L 13(L)  ALT 14 - 54 U/L 11(L)   Edinburgh Score: Edinburgh Postnatal Depression Scale Screening Tool 04/11/2018  I have been able to laugh and see the funny side of things. 0  I have looked forward with enjoyment to things. 0  I have blamed myself unnecessarily when things went wrong. 1  I have been anxious or worried for no good reason. 0  I have felt scared or panicky for no good reason. 0  Things have been getting on top of me. 1  I have been so unhappy that I have had difficulty sleeping. 0  I have felt sad or miserable. 0  I have been so unhappy that I have been crying. 0  The thought of harming myself has occurred to me. 0  Edinburgh Postnatal Depression Scale Total 2      After visit meds:  Allergies as of 09/04/2021       Reactions   Iodine Anaphylaxis   Shellfish Allergy Anaphylaxis        Medication List     TAKE these medications    acetaminophen 500 MG tablet Commonly known as: TYLENOL Take 1 tablet (500 mg total) by mouth every 6 (six) hours as needed for moderate pain (for pain scale < 4).   ibuprofen 600 MG tablet Commonly known as: ADVIL Take 1 tablet (600 mg total) by mouth every 6 (six) hours as needed.   prenatal multivitamin Tabs tablet Take 1 tablet by mouth daily.       Please schedule this patient for Postpartum visit in: 4 weeks with the  following provider: Any provider Virtual For C/S patients schedule nurse incision check in weeks 2 weeks: no Low risk pregnancy complicated by: none Delivery mode:  SVD Anticipated Birth Control:  other/unsure PP Procedures needed: none  Edinburgh: negative Schedule Integrated BH visit: yes  No relevant baby issues     Discharge home in stable condition   Infant Feeding: Bottle Infant Disposition:home with mother Discharge instruction: per After Visit Summary and Postpartum booklet. Activity: Advance as tolerated. Pelvic rest for 6 weeks.  Diet: routine diet Anticipated Birth Control: Unsure Postpartum Appointment:4 weeks Additional Postpartum F/U: Postpartum Depression checkup Future Appointments: Future Appointments  Date Time Provider [place]  10/06/2021 10:15 AM Shelly Bombard, MD [place] None   Follow up Visit:  [place]. Call in 5 week(s).   Specialty: Obstetrics and Gynecology Contact information: [address] [place] [phone]                    09/04/2021 Annalee Genta, DO

## 2021-09-03 NOTE — Progress Notes (Signed)
Patient ID: Pamela Jefferson, female   DOB: 1995/12/24, 24 y.o.   MRN: [id number] Developed repetitive decels to 80s to 90s at 2335   Patient turned side to side Pitocin off  FSE/IUPC inserted Amnioinfusion started\  Terbutaline given to rest baby,  UCs continued despite this dose  FHR recovered soon thereafter and is now reactive  Cervix dilation progressed to Dilation: 8 Effacement (%): 90 Station: 0 Presentation: Vertex Exam by:: Wynelle Bourgeois, CNM  Will observe for now with pitocin off

## 2021-09-04 MED ORDER — IBUPROFEN 600 MG PO TABS
600.0000 mg | ORAL_TABLET | Freq: Four times a day (QID) | ORAL | 0 refills | Status: DC | PRN
Start: 2021-09-04 — End: 2022-06-18

## 2021-09-04 MED ORDER — ACETAMINOPHEN 500 MG PO TABS: 500.0000 mg | ORAL_TABLET | Freq: Four times a day (QID) | ORAL | 0 refills | Status: DC | PRN

## 2021-09-04 NOTE — Progress Notes (Addendum)
CSW met with MOB to complete consult for history mental health. CSW observed MOB sitting up in bed, and infant in bassinet. CSW explained role, and reason for consult. MOB was pleasant, and polite during engagement with CSW. MOB reported, history of anxiety, depression, and SI in 2018. MOB denied saying she "wants to give up, no help". MOB reported, history of weekly support group through pregnancy network. MOB reported, now that infant is born, she will transition into the mother, and baby group. MOB reported, it was rough at the beginning of her pregnancy. MOB denied any history of psychotropic medication involvement. CSW encourage MOB to implement healthy coping skills when symptoms arises.  ° °CSW provided education regarding the baby blues period vs. perinatal mood disorders, discussed treatment and gave resources for mental health follow up if concerns arise. CSW recommends self- evaluation during the postpartum time period using the New Mom Checklist from Postpartum Progress and encouraged MOB to contact a medical professional if symptoms are noted at any time.  ° °MOB reported, since delivery she feels, "exhausted". MOB reported, she does not have any support. MOB denied SI, HI, and DV when CSW assessed for safety.  ° °MOB reported, there are no transportation barriers to follow up infant's care. MOB reported, she has all essentials needed to care for infant. MOB reported, infant has a car seat, and bassinet. MOB denied any additional barriers.   °  °CSW provided education on sudden infant death syndrome (SIDS). ° °CSW provided perinatal mood disorders resources.  ° °CSW identifies no further need for intervention or barriers to discharge at this time. ° °Nalani Andreen, MSW, LCSW-A °Clinical Social Worker- Weekends °[phone]  °

## 2021-09-16 ENCOUNTER — Telehealth (HOSPITAL_COMMUNITY): Payer: Self-pay | Admitting: *Deleted

## 2021-09-16 NOTE — Telephone Encounter (Signed)
Phone voicemail message left to return nurse call.  Duffy Rhody, RN 09-16-2021 at 3:51pm

## 2021-09-19 NOTE — L&D Delivery Note (Signed)
Delivery Note At 12:18 PM a viable female was delivered via Vaginal, Spontaneous (Presentation: Left Occiput Anterior).  APGAR: 9, 9; weight  pending.   Placenta status: Spontaneous, Intact.  Cord: 3 vessels with the following complications: None.  Cord pH: n/a  Anesthesia: Epidural Episiotomy: None Lacerations: 2nd degree, minor periurethral lac Suture Repair: 2.0 vicryl Est. Blood Loss (mL):  28 cc  Mom to postpartum.  Baby to Couplet care / Skin to Skin.  Purcell Nails 08/13/2022, 1:13 PM

## 2021-10-06 ENCOUNTER — Ambulatory Visit: Payer: Medicaid Other | Admitting: Obstetrics

## 2021-10-18 ENCOUNTER — Ambulatory Visit: Payer: Medicaid Other | Admitting: Obstetrics and Gynecology

## 2021-10-18 ENCOUNTER — Telehealth: Payer: Self-pay

## 2021-10-18 NOTE — Telephone Encounter (Signed)
°  No Ans.  LVMM to call office

## 2021-10-22 ENCOUNTER — Telehealth: Payer: Self-pay | Admitting: *Deleted

## 2021-10-22 NOTE — Telephone Encounter (Signed)
Pt had called to office, left message regarding some forms.  Attempt to contact pt, LVM she may call office.   ?forms that were received this week in office for Incontinence supplies.  Forms were not completed and denied as no notes of this need in her chart.

## 2021-11-10 ENCOUNTER — Ambulatory Visit: Payer: Medicaid Other | Admitting: Advanced Practice Midwife

## 2021-11-15 ENCOUNTER — Encounter: Payer: Medicaid Other | Admitting: Obstetrics and Gynecology

## 2021-12-14 ENCOUNTER — Ambulatory Visit: Payer: Self-pay | Admitting: *Deleted

## 2021-12-14 NOTE — Telephone Encounter (Signed)
?  Chief Complaint: pregnant ?Symptoms: overwhelmed feeling like will hurt self ?Frequency: now ?Pertinent Negatives: Patient denies that she will actually kill herself but feels helpless ?Disposition: [] ED /[] Urgent Care (no appt availability in office) / [] Appointment(In office/virtual)/ []  [facility]/ [] Home Care/ [] Refused Recommended Disposition /[] [facility]/ []  Follow-up with PCP ?Additional Notes: 911 called, sent police for a wellness check. Pt assured that they were not coming to take her or her children. Stayed on phone with pt, told her I would stay until they got there. She eventually hung up. Called back and no answer. ? ? ?Answer Assessment - Initial Assessment Questions ?1. REASON FOR CALL or QUESTION: "What is your reason for calling today?" or "How can I best help you?" or "What question do you have that I can help answer?" ?    Pt distraught, states she has a 43 month old and now she is pregnant again. She has three children already. States she is trying to not kill herself if she could find somewhere that could help her. ? ?Protocols used: Information Only Call - No Triage-A-AH ? ?

## 2022-03-01 DIAGNOSIS — N898 Other specified noninflammatory disorders of vagina: Secondary | ICD-10-CM | POA: Diagnosis not present

## 2022-03-01 DIAGNOSIS — Z363 Encounter for antenatal screening for malformations: Secondary | ICD-10-CM | POA: Diagnosis not present

## 2022-03-01 DIAGNOSIS — Z113 Encounter for screening for infections with a predominantly sexual mode of transmission: Secondary | ICD-10-CM | POA: Diagnosis not present

## 2022-03-01 DIAGNOSIS — Z369 Encounter for antenatal screening, unspecified: Secondary | ICD-10-CM | POA: Diagnosis not present

## 2022-03-01 DIAGNOSIS — Z3A16 16 weeks gestation of pregnancy: Secondary | ICD-10-CM | POA: Diagnosis not present

## 2022-03-01 DIAGNOSIS — N925 Other specified irregular menstruation: Secondary | ICD-10-CM | POA: Diagnosis not present

## 2022-03-01 DIAGNOSIS — Z3403 Encounter for supervision of normal first pregnancy, third trimester: Secondary | ICD-10-CM | POA: Diagnosis not present

## 2022-03-01 LAB — OB RESULTS CONSOLE GC/CHLAMYDIA
Chlamydia: NEGATIVE
Neisseria Gonorrhea: NEGATIVE

## 2022-03-01 LAB — OB RESULTS CONSOLE HIV ANTIBODY (ROUTINE TESTING): HIV: NONREACTIVE

## 2022-03-01 LAB — OB RESULTS CONSOLE HEPATITIS B SURFACE ANTIGEN: Hepatitis B Surface Ag: NEGATIVE

## 2022-03-01 LAB — OB RESULTS CONSOLE RUBELLA ANTIBODY, IGM: Rubella: IMMUNE

## 2022-03-30 ENCOUNTER — Encounter (HOSPITAL_COMMUNITY): Payer: Self-pay | Admitting: Obstetrics & Gynecology

## 2022-03-30 ENCOUNTER — Inpatient Hospital Stay (HOSPITAL_COMMUNITY)
Admission: AD | Admit: 2022-03-30 | Discharge: 2022-03-30 | Disposition: A | Discharge status: Home / Self Care | Payer: Medicaid Other | Attending: Obstetrics & Gynecology | Admitting: Obstetrics & Gynecology

## 2022-03-30 ENCOUNTER — Other Ambulatory Visit: Payer: Self-pay

## 2022-03-30 DIAGNOSIS — Z3A20 20 weeks gestation of pregnancy: Secondary | ICD-10-CM | POA: Diagnosis not present

## 2022-03-30 DIAGNOSIS — N949 Unspecified condition associated with female genital organs and menstrual cycle: Secondary | ICD-10-CM

## 2022-03-30 DIAGNOSIS — Z711 Person with feared health complaint in whom no diagnosis is made: Secondary | ICD-10-CM

## 2022-03-30 DIAGNOSIS — O26892 Other specified pregnancy related conditions, second trimester: Secondary | ICD-10-CM | POA: Diagnosis not present

## 2022-03-30 DIAGNOSIS — R102 Pelvic and perineal pain: Secondary | ICD-10-CM | POA: Diagnosis not present

## 2022-03-30 NOTE — MAU Note (Signed)
Pamela Jefferson is a 26 y.o. here in MAU reporting: lower abdomina pain since about 0700. Pain has been intermittent but none currently. States pain happened 2 times this AM. No bleeding or LOF.  Onset of complaint: today  Pain score: 0/10  Vitals:   03/30/22 1218  BP: 109/68  Pulse: 79  Resp: 16  Temp: 98.5 F (36.9 C)  SpO2: 100%     FHT:138  Lab orders placed from triage: none

## 2022-03-30 NOTE — MAU Provider Note (Signed)
Event Date/Time   First Provider Initiated Contact with Patient 03/30/22 1228      S Pamela Jefferson is a 26 y.o. [phone] patient who presents to MAU today with complaint with abdominal pain. Patient states this is new onset. She states she had one pain this morning at 0700 lasting about 1-2 mins, then had another at 1120 am with the same presentation. States feels like a "period cramp," rates pain 8/10 when it occurs. Currently no pain at this time. Denies feeling with movement or motion. Per Pt, infant measuring small, [place] ordered from [facility], but has not been done yet. She denies vaginal bleeding, leaking of fluid, abnormal discharge, urinary symptoms. She denies fetal movement, but states she has not felt movement throughout this pregnancy. FHT of 138 obtained in triage.   O BP 109/68 (BP Location: Right Arm)   Pulse 79   Temp 98.5 F (36.9 C) (Oral)   Resp 16   Wt 98.7 kg   SpO2 100% Comment: room air  BMI 33.07 kg/m  Physical Exam Vitals and nursing note reviewed.  Constitutional:      General: She is not in acute distress.    Appearance: Normal appearance.  HENT:     Head: Normocephalic.  Pulmonary:     Effort: Pulmonary effort is normal.  Abdominal:     Palpations: Abdomen is soft.     Tenderness: There is no abdominal tenderness.     Comments: Pregnant   Musculoskeletal:     Cervical back: Normal range of motion.  Skin:    General: Skin is warm and dry.  Neurological:     Mental Status: She is alert and oriented to person, place, and time.  Psychiatric:        Mood and Affect: Mood normal.     A Medical screening exam complete  1. Physically well but worried   2. [redacted] weeks gestation of pregnancy   3. Round ligament pain    P - Discussed round ligament pain and at home comfort measures.  - Warning signs for worsening condition that would warrant emergency follow-up discussed - Discharge from MAU in stable condition - Patient may return to MAU as needed    Carlynn Herald, [place] 03/30/2022 2:51 PM

## 2022-04-08 ENCOUNTER — Other Ambulatory Visit: Payer: Self-pay

## 2022-04-08 ENCOUNTER — Other Ambulatory Visit: Payer: Self-pay | Admitting: Obstetrics and Gynecology

## 2022-04-08 DIAGNOSIS — Z363 Encounter for antenatal screening for malformations: Secondary | ICD-10-CM

## 2022-04-28 ENCOUNTER — Encounter: Payer: Self-pay | Admitting: *Deleted

## 2022-04-28 ENCOUNTER — Ambulatory Visit: Payer: Medicaid Other | Attending: Obstetrics and Gynecology

## 2022-04-28 ENCOUNTER — Other Ambulatory Visit: Payer: Self-pay | Admitting: *Deleted

## 2022-04-28 ENCOUNTER — Ambulatory Visit: Payer: Medicaid Other | Admitting: *Deleted

## 2022-04-28 VITALS — BP 122/58 | HR 91

## 2022-04-28 DIAGNOSIS — Z3492 Encounter for supervision of normal pregnancy, unspecified, second trimester: Secondary | ICD-10-CM

## 2022-04-28 DIAGNOSIS — Z3A25 25 weeks gestation of pregnancy: Secondary | ICD-10-CM | POA: Insufficient documentation

## 2022-04-28 DIAGNOSIS — O09892 Supervision of other high risk pregnancies, second trimester: Secondary | ICD-10-CM | POA: Insufficient documentation

## 2022-04-28 DIAGNOSIS — Z363 Encounter for antenatal screening for malformations: Secondary | ICD-10-CM | POA: Diagnosis not present

## 2022-04-28 DIAGNOSIS — Z362 Encounter for other antenatal screening follow-up: Secondary | ICD-10-CM

## 2022-04-28 DIAGNOSIS — O99212 Obesity complicating pregnancy, second trimester: Secondary | ICD-10-CM | POA: Diagnosis not present

## 2022-05-03 DIAGNOSIS — Z3482 Encounter for supervision of other normal pregnancy, second trimester: Secondary | ICD-10-CM | POA: Diagnosis not present

## 2022-05-03 DIAGNOSIS — Z315 Encounter for genetic counseling: Secondary | ICD-10-CM | POA: Diagnosis not present

## 2022-05-26 ENCOUNTER — Ambulatory Visit: Payer: Medicaid Other | Attending: Obstetrics

## 2022-05-26 ENCOUNTER — Ambulatory Visit: Payer: Medicaid Other

## 2022-06-08 DIAGNOSIS — F419 Anxiety disorder, unspecified: Secondary | ICD-10-CM | POA: Diagnosis not present

## 2022-06-08 DIAGNOSIS — Z3A30 30 weeks gestation of pregnancy: Secondary | ICD-10-CM | POA: Diagnosis not present

## 2022-06-08 DIAGNOSIS — Z362 Encounter for other antenatal screening follow-up: Secondary | ICD-10-CM | POA: Diagnosis not present

## 2022-06-08 DIAGNOSIS — Z91013 Allergy to seafood: Secondary | ICD-10-CM | POA: Diagnosis not present

## 2022-06-08 DIAGNOSIS — O0932 Supervision of pregnancy with insufficient antenatal care, second trimester: Secondary | ICD-10-CM | POA: Diagnosis not present

## 2022-06-08 DIAGNOSIS — L989 Disorder of the skin and subcutaneous tissue, unspecified: Secondary | ICD-10-CM | POA: Diagnosis not present

## 2022-06-08 DIAGNOSIS — Z23 Encounter for immunization: Secondary | ICD-10-CM | POA: Diagnosis not present

## 2022-06-08 DIAGNOSIS — F53 Postpartum depression: Secondary | ICD-10-CM | POA: Diagnosis not present

## 2022-06-08 DIAGNOSIS — E559 Vitamin D deficiency, unspecified: Secondary | ICD-10-CM | POA: Diagnosis not present

## 2022-06-08 DIAGNOSIS — Z331 Pregnant state, incidental: Secondary | ICD-10-CM | POA: Diagnosis not present

## 2022-06-08 LAB — OB RESULTS CONSOLE RPR: RPR: NONREACTIVE

## 2022-06-15 ENCOUNTER — Ambulatory Visit: Payer: Medicaid Other | Admitting: *Deleted

## 2022-06-15 ENCOUNTER — Ambulatory Visit: Payer: Medicaid Other | Attending: Obstetrics

## 2022-06-15 VITALS — BP 125/73 | HR 94

## 2022-06-15 DIAGNOSIS — O99212 Obesity complicating pregnancy, second trimester: Secondary | ICD-10-CM | POA: Diagnosis not present

## 2022-06-15 DIAGNOSIS — Z362 Encounter for other antenatal screening follow-up: Secondary | ICD-10-CM | POA: Diagnosis not present

## 2022-06-15 DIAGNOSIS — E669 Obesity, unspecified: Secondary | ICD-10-CM

## 2022-06-15 DIAGNOSIS — Z3A31 31 weeks gestation of pregnancy: Secondary | ICD-10-CM

## 2022-06-15 DIAGNOSIS — O358XX0 Maternal care for other (suspected) fetal abnormality and damage, not applicable or unspecified: Secondary | ICD-10-CM | POA: Diagnosis not present

## 2022-06-15 DIAGNOSIS — O09899 Supervision of other high risk pregnancies, unspecified trimester: Secondary | ICD-10-CM | POA: Diagnosis present

## 2022-06-18 ENCOUNTER — Encounter (HOSPITAL_COMMUNITY): Payer: Self-pay | Admitting: Obstetrics and Gynecology

## 2022-06-18 ENCOUNTER — Inpatient Hospital Stay (HOSPITAL_COMMUNITY)
Admission: AD | Admit: 2022-06-18 | Discharge: 2022-06-18 | Disposition: A | Discharge status: Home / Self Care | Payer: Medicaid Other | Attending: Obstetrics and Gynecology | Admitting: Obstetrics and Gynecology

## 2022-06-18 DIAGNOSIS — O26893 Other specified pregnancy related conditions, third trimester: Secondary | ICD-10-CM | POA: Diagnosis not present

## 2022-06-18 DIAGNOSIS — O26899 Other specified pregnancy related conditions, unspecified trimester: Secondary | ICD-10-CM | POA: Diagnosis not present

## 2022-06-18 DIAGNOSIS — R109 Unspecified abdominal pain: Secondary | ICD-10-CM | POA: Diagnosis not present

## 2022-06-18 DIAGNOSIS — Z3A32 32 weeks gestation of pregnancy: Secondary | ICD-10-CM | POA: Insufficient documentation

## 2022-06-18 DIAGNOSIS — Z3689 Encounter for other specified antenatal screening: Secondary | ICD-10-CM

## 2022-06-18 LAB — URINALYSIS, ROUTINE W REFLEX MICROSCOPIC
Bacteria, UA: NONE SEEN
Bilirubin Urine: NEGATIVE
Glucose, UA: NEGATIVE mg/dL
Ketones, ur: NEGATIVE mg/dL
Nitrite: NEGATIVE
Protein, ur: NEGATIVE mg/dL
Specific Gravity, Urine: 1.015 (ref 1.005–1.030)
pH: 6 (ref 5.0–8.0)

## 2022-06-18 LAB — FETAL FIBRONECTIN: Fetal Fibronectin: NEGATIVE

## 2022-06-18 LAB — WET PREP, GENITAL
Clue Cells Wet Prep HPF POC: NONE SEEN
Trich, Wet Prep: NONE SEEN
WBC, Wet Prep HPF POC: >=10 — AB (ref <10)
Yeast Wet Prep HPF POC: NONE SEEN

## 2022-06-18 NOTE — MAU Provider Note (Signed)
Patient Pamela Jefferson is a 26 y.o. [phone]  At [redacted]w[redacted]d here with complaints of contractions that started at 5 am. She denies LOF, she reports a burning sensation when she urinates. She denies VB. She reports strong fetal movements. She has had an uncomplicted pregnancy thus far. She states that she "didn't have contractions" with her daughter and so she doesn't know what is going on. She denies recent IC.   She is very active; she has a 25 year old, 25 year old and 9 months.  History     CSN: [id number]  Arrival date and time: 06/18/22 0254   None     No chief complaint on file.  Abdominal Pain This is a new problem. The current episode started today. The problem occurs intermittently. The pain is at a severity of 8/10. The abdominal pain does not radiate. Associated symptoms include dysuria. Pertinent negatives include no hematuria or vomiting. Nothing aggravates the pain. The pain is relieved by Nothing.  Dysuria  This is a new problem. The current episode started in the past 7 days. The problem occurs intermittently. The quality of the pain is described as burning. There has been no fever. Pertinent negatives include no discharge, flank pain, hematuria, urgency or vomiting.   She reports that it feels "burning" down there on her skin. She has not changed soaps recently. She was going to be seen at her OB on Wednesday but could not wait.  OB History     Gravida  5   Para  3   Term  3   Preterm      AB  1   Living  3      SAB  1   IAB      Ectopic      Multiple  0   Live Births  3           Past Medical History:  Diagnosis Date   Anemia    Anxiety    Chlamydia    Depression    "about ready to give up:", no help, just tired   Gonorrhea    Hx of gastric ulcer     Past Surgical History:  Procedure Laterality Date   FRACTURE SURGERY     left arm    Family History  Problem Relation Age of Onset   Neurologic Disorder Mother    Hip fracture Maternal  Grandmother    Diabetes Maternal Grandfather    Cancer Maternal Grandfather    Asthma Neg Hx    Heart disease Neg Hx    Hypertension Neg Hx    Stroke Neg Hx     Social History   Tobacco Use   Smoking status: Former    Packs/day: 0.25    Years: 4.00    Total pack years: 1.00    Types: Cigarettes    Quit date: 04/07/2022    Years since quitting: 0.1   Smokeless tobacco: Never  Vaping Use   Vaping Use: Never used  Substance Use Topics   Alcohol use: No   Drug use: No    Allergies:  Allergies  Allergen Reactions   Iodine Anaphylaxis   Shellfish Allergy Anaphylaxis    Medications Prior to Admission  Medication Sig Dispense Refill Last Dose   Prenatal Vit-Fe Fumarate-FA (PRENATAL MULTIVITAMIN) TABS tablet Take 1 tablet by mouth daily.   06/17/2022   acetaminophen (TYLENOL) 500 MG tablet Take 1 tablet (500 mg total) by mouth every 6 (six) hours as needed for  moderate pain (for pain scale < 4). (Patient not taking: Reported on 04/28/2022) 30 tablet 0    ibuprofen (ADVIL) 600 MG tablet Take 1 tablet (600 mg total) by mouth every 6 (six) hours as needed. (Patient not taking: Reported on 04/28/2022) 30 tablet 0     Review of Systems  Constitutional: Negative.   HENT: Negative.    Respiratory: Negative.    Cardiovascular: Negative.   Gastrointestinal:  Positive for abdominal pain. Negative for vomiting.  Genitourinary:  Positive for dysuria. Negative for flank pain, hematuria and urgency.  Musculoskeletal: Negative.   Neurological: Negative.   Hematological: Negative.   Psychiatric/Behavioral: Negative.     Physical Exam   Blood pressure 125/63, pulse 84, temperature 98 F (36.7 C), temperature source Oral, resp. rate 14, height 5\' 8"  (1.727 m), weight 98.6 kg, last menstrual period 10/09/2021, SpO2 99 %, unknown if currently breastfeeding.  Physical Exam Constitutional:      Appearance: Normal appearance.  Abdominal:     General: Abdomen is flat. There is no distension.      Tenderness: There is no abdominal tenderness.  Genitourinary:    General: Normal vulva.     Vagina: Vaginal discharge present.     Comments: NEFG; white leukorrhea present in the vagina, vaginal walls are slightly reddened but no lesions, no odor. Cervix is long, posterior and 1 cm.  Musculoskeletal:        General: Normal range of motion.  Skin:    General: Skin is warm.  Neurological:     General: No focal deficit present.     Mental Status: She is alert.  Psychiatric:        Mood and Affect: Mood normal.     MAU Course  Procedures  MDM -FFN negative, wet prep negative except for sperm noted; patient states she had intercourse on Wednesday -NST: 130 bpm, mod var, present acel, no decels, no contractions -patient is not able to time her contractions, states it is an overall pain that comes and goes. She appears very comfortable in bed and is using her phone. She continues to deny LOF, VB, decreased fetal movements, nausea, vomiting.  Assessment and Plan   1. Abdominal pain affecting pregnancy   2. [redacted] weeks gestation of pregnancy   3. NST (non-stress test) reactive   -keep appt this Wednesday for OB -Urine culture pending -GC CT pending - strict return precautions reviewed; patient is stable for discharge.  Wednesday Deslyn Cavenaugh 06/18/2022, 10:02 AM

## 2022-06-18 NOTE — MAU Note (Signed)
...  Pamela Jefferson is a 26 y.o. at [redacted]w[redacted]d here in MAU reporting: She reports last night around midnight she began experiencing sharp/shooting vaginal pains that made it hard for her to sit down. Around 0500 this morning she began experiencing CTX's and reports they are every 5-10 minutes. Endorses thin white vaginal discharge with vaginal itching that began after getting out of the bath tub last night around 2300. Denies recent IC. Denies VB or LOF. +FM.  Next OB visit 10/4.  Pain scores:  7/10 lower abdomen 9/10 vagina  FHT: 128 initial external Lab orders placed from triage:  UA

## 2022-06-19 LAB — CULTURE, OB URINE

## 2022-06-20 LAB — GC/CHLAMYDIA PROBE AMP (~~LOC~~) NOT AT ARMC
Chlamydia: NEGATIVE
Comment: NEGATIVE
Comment: NORMAL
Neisseria Gonorrhea: NEGATIVE

## 2022-07-02 ENCOUNTER — Inpatient Hospital Stay (HOSPITAL_COMMUNITY)
Admission: AD | Admit: 2022-07-02 | Discharge: 2022-07-03 | Disposition: A | Discharge status: Home / Self Care | Payer: Medicaid Other | Attending: Obstetrics and Gynecology | Admitting: Obstetrics and Gynecology

## 2022-07-02 ENCOUNTER — Encounter (HOSPITAL_COMMUNITY): Payer: Self-pay | Admitting: Obstetrics and Gynecology

## 2022-07-02 DIAGNOSIS — Z3A34 34 weeks gestation of pregnancy: Secondary | ICD-10-CM | POA: Diagnosis not present

## 2022-07-02 DIAGNOSIS — O479 False labor, unspecified: Secondary | ICD-10-CM | POA: Diagnosis not present

## 2022-07-02 DIAGNOSIS — O4703 False labor before 37 completed weeks of gestation, third trimester: Secondary | ICD-10-CM | POA: Insufficient documentation

## 2022-07-02 LAB — URINALYSIS, ROUTINE W REFLEX MICROSCOPIC
Bilirubin Urine: NEGATIVE
Glucose, UA: NEGATIVE mg/dL
Hgb urine dipstick: NEGATIVE
Ketones, ur: NEGATIVE mg/dL
Nitrite: NEGATIVE
Protein, ur: 30 mg/dL — AB
Specific Gravity, Urine: 1.029 (ref 1.005–1.030)
pH: 6 (ref 5.0–8.0)

## 2022-07-02 NOTE — MAU Note (Signed)
.  Pamela Jefferson is a 26 y.o. at [redacted]w[redacted]d here in MAU reporting: infrequency ctx and pressure while pt walks and sits since 1700 today. Pt states she has been having ctx on and off for a few weeks, and has been evaluated in MAU on 9/30. Pt states she did have a yeast infection, but wasn't given anything for treatment. Pt denies VB, LOF, DFM, abnormal discharge, and complications in the pregnancy.  SVE on 9/30 1 cm Onset of complaint: 1700 Pain score: 7/10 Vitals:   07/02/22 2238  BP: 137/71  Pulse: (!) 108  Resp: 18  Temp: 98.1 F (36.7 C)     FHT:140 Lab orders placed from triage:  ua

## 2022-07-02 NOTE — MAU Provider Note (Signed)
History     CSN: [id number]  Arrival date and time: 07/02/22 2211   Event Date/Time   First Provider Initiated Contact with Patient 07/02/22 2310      Chief Complaint  Patient presents with   Contractions   HPI  [name] is a 26 y.o. female [phone] @ [redacted]w[redacted]d here in MAU with complaints of contractions. She reports contractions that are irregular. The contractions are not any stronger than what she has experienced in the past. She has no bleeding,  no changes in her fetal movements. She does reports good fetal movements. No history of preterm delivery.   OB History     Gravida  5   Para  3   Term  3   Preterm      AB  1   Living  3      SAB  1   IAB      Ectopic      Multiple  0   Live Births  3           Past Medical History:  Diagnosis Date   Anemia    Anxiety    Chlamydia    Depression    "about ready to give up:", no help, just tired   Gonorrhea    Hx of gastric ulcer     Past Surgical History:  Procedure Laterality Date   FRACTURE SURGERY     left arm    Family History  Problem Relation Age of Onset   Neurologic Disorder Mother    Hip fracture Maternal Grandmother    Diabetes Maternal Grandfather    Cancer Maternal Grandfather    Asthma Neg Hx    Heart disease Neg Hx    Hypertension Neg Hx    Stroke Neg Hx     Social History   Tobacco Use   Smoking status: Former    Packs/day: 0.25    Years: 4.00    Total pack years: 1.00    Types: Cigarettes    Quit date: 04/07/2022    Years since quitting: 0.2   Smokeless tobacco: Never  Vaping Use   Vaping Use: Never used  Substance Use Topics   Alcohol use: No   Drug use: No    Allergies:  Allergies  Allergen Reactions   Iodine Anaphylaxis   Shellfish Allergy Anaphylaxis    Medications Prior to Admission  Medication Sig Dispense Refill Last Dose   acetaminophen (TYLENOL) 500 MG tablet Take 1 tablet (500 mg total) by mouth every 6 (six) hours as needed for  moderate pain (for pain scale < 4). (Patient not taking: Reported on 04/28/2022) 30 tablet 0    Prenatal Vit-Fe Fumarate-FA (PRENATAL MULTIVITAMIN) TABS tablet Take 1 tablet by mouth daily.      Results for orders placed or performed during the hospital encounter of 07/02/22 (from the past 48 hour(s))  Urinalysis, Routine w reflex microscopic     Status: Abnormal   Collection Time: 07/02/22 10:40 PM  Result Value Ref Range   Color, Urine YELLOW YELLOW   APPearance CLOUDY (A) CLEAR   Specific Gravity, Urine 1.029 1.005 - 1.030   pH 6.0 5.0 - 8.0   Glucose, UA NEGATIVE NEGATIVE mg/dL   Hgb urine dipstick NEGATIVE NEGATIVE   Bilirubin Urine NEGATIVE NEGATIVE   Ketones, ur NEGATIVE NEGATIVE mg/dL   Protein, ur 30 (A) NEGATIVE mg/dL   Nitrite NEGATIVE NEGATIVE   Leukocytes,Ua MODERATE (A) NEGATIVE   RBC / HPF 0-5 0 -  5 RBC/hpf   WBC, UA 0-5 0 - 5 WBC/hpf   Bacteria, UA RARE (A) NONE SEEN   Squamous Epithelial / LPF 21-50 0 - 5   Mucus PRESENT     Comment: Performed at [facility], [place] [address], [place], [place] [address]     Review of Systems  Constitutional:  Negative for fever.  Gastrointestinal:  Positive for abdominal pain.  Genitourinary:  Negative for vaginal bleeding and vaginal discharge.   Physical Exam   Blood pressure (!) 141/80, pulse (!) 116, temperature 98.1 F (36.7 C), temperature source Oral, resp. rate 18, height 5\' 8"  (1.727 m), weight 98.3 kg, last menstrual period 10/09/2021, unknown if currently breastfeeding.  Patient Vitals for the past 24 hrs:  BP Temp Temp src Pulse Resp SpO2 Height Weight  07/02/22 2333 117/71 98.8 F (37.1 C) Oral (!) 111 18 99 % -- --  07/02/22 2258 (!) 141/80 -- -- (!) 116 -- -- -- --  07/02/22 2238 137/71 98.1 F (36.7 C) Oral (!) 108 18 -- 5\' 8"  (1.727 m) 98.3 kg     Physical Exam Vitals and nursing note reviewed.  Constitutional:      General: She is not in acute distress.    Appearance: Normal appearance. She is  not ill-appearing, toxic-appearing or diaphoretic.  Abdominal:     Palpations: Abdomen is soft.  Genitourinary:    Comments: Cervix closed, thick, anterior.  Exam by Noni Saupe, NP  Skin:    General: Skin is warm.  Neurological:     Mental Status: She is alert and oriented to person, place, and time.  Psychiatric:        Behavior: Behavior normal.    Fetal Tracing: Baseline: 140 bpm Variability: Moderate  Accelerations: 15x15 Decelerations: None Toco: UI  MAU Course  Procedures  MDM  Cervix is closed and long.  Patient is requesting to leave.  Once mildly elevated BP, subsequent readings are WNL  Assessment and Plan   A:  1. Braxton Hicks contractions   2. [redacted] weeks gestation of pregnancy      P:  Dc home Return to MAU if symptoms worsen Labor precautions.  Increase oral fluid intake.  Lezlie Lye, NP 07/02/2022 11:44 PM

## 2022-07-03 DIAGNOSIS — O4703 False labor before 37 completed weeks of gestation, third trimester: Secondary | ICD-10-CM | POA: Diagnosis not present

## 2022-07-03 DIAGNOSIS — Z3A34 34 weeks gestation of pregnancy: Secondary | ICD-10-CM | POA: Diagnosis not present

## 2022-07-03 DIAGNOSIS — O479 False labor, unspecified: Secondary | ICD-10-CM | POA: Diagnosis not present

## 2022-07-18 ENCOUNTER — Telehealth (HOSPITAL_COMMUNITY): Payer: Self-pay

## 2022-07-18 NOTE — Telephone Encounter (Signed)
Preadmission screening 

## 2022-07-29 ENCOUNTER — Inpatient Hospital Stay (HOSPITAL_COMMUNITY)
Admission: AD | Admit: 2022-07-29 | Discharge: 2022-07-30 | Disposition: A | Discharge status: Home / Self Care | Payer: Medicaid Other | Attending: Obstetrics & Gynecology | Admitting: Obstetrics & Gynecology

## 2022-07-29 ENCOUNTER — Encounter (HOSPITAL_COMMUNITY): Payer: Self-pay | Admitting: Obstetrics & Gynecology

## 2022-07-29 DIAGNOSIS — Z3A38 38 weeks gestation of pregnancy: Secondary | ICD-10-CM | POA: Insufficient documentation

## 2022-07-29 DIAGNOSIS — O479 False labor, unspecified: Secondary | ICD-10-CM

## 2022-07-29 DIAGNOSIS — O471 False labor at or after 37 completed weeks of gestation: Secondary | ICD-10-CM | POA: Insufficient documentation

## 2022-07-29 NOTE — MAU Note (Signed)
Pt says she feels UC's  Strong since 815pm [facility]- [facility]- saw [facility] , CNM - NO VE - 2-3 weeks ago GBS- Not collected yet Denies HSV

## 2022-07-30 ENCOUNTER — Inpatient Hospital Stay (HOSPITAL_COMMUNITY)
Admission: AD | Admit: 2022-07-30 | Discharge: 2022-07-31 | Disposition: A | Discharge status: Home / Self Care | Payer: Medicaid Other | Source: Home / Self Care | Attending: Obstetrics & Gynecology | Admitting: Obstetrics & Gynecology

## 2022-07-30 ENCOUNTER — Encounter (HOSPITAL_COMMUNITY): Payer: Self-pay | Admitting: Obstetrics & Gynecology

## 2022-07-30 DIAGNOSIS — O479 False labor, unspecified: Secondary | ICD-10-CM

## 2022-07-30 DIAGNOSIS — O471 False labor at or after 37 completed weeks of gestation: Secondary | ICD-10-CM | POA: Diagnosis not present

## 2022-07-30 DIAGNOSIS — Z3A38 38 weeks gestation of pregnancy: Secondary | ICD-10-CM | POA: Insufficient documentation

## 2022-07-30 LAB — GROUP B STREP BY PCR: Group B strep by PCR: NEGATIVE

## 2022-07-30 NOTE — MAU Note (Signed)
I have communicated with Judeth Horn; who spoke with [facility] and reviewed vital signs:  Vitals:   07/29/22 2241 07/30/22 0028  BP: 122/73 (!) 105/53  Pulse: (!) 119 97  Resp: 16 17  Temp: 98.4 F (36.9 C) 98.4 F (36.9 C)  SpO2:  100%    Vaginal exam:  Dilation: 2.5 Effacement (%): 70 Cervical Position: Middle Station: -2 Presentation: Vertex Exam by:: Ginnie Smart RN,   Also reviewed contraction pattern and that non-stress test is reactive.  It has been documented that patient is no longer contracting. SVE defered.  Patient denies any other complaints.  Based on this report provider has given order for discharge.  A discharge order and diagnosis entered by a provider.   Labor discharge instructions reviewed with patient.

## 2022-07-30 NOTE — MAU Note (Signed)
.  Pamela Jefferson is a 26 y.o. at [redacted]w[redacted]d here in MAU reporting: contractions that started around 1900 today and have picked up to 4-6 minutes apart.  Pt reports +FM and denies VB.    Onset of complaint: 1900 Pain score: 9 Vitals:   07/30/22 2209 07/30/22 2210  BP:  127/73  Pulse:  (!) 116  Resp:  17  Temp:  98.3 F (36.8 C)  SpO2: 100% 100%     FHT:125  Lab orders placed from triage:    none

## 2022-07-30 NOTE — MAU Provider Note (Signed)
S: Patient is here for RN labor evaluation. Fetal tracing, vital signs, & chart reviewed   O:  Vitals:   07/30/22 2209 07/30/22 2210 07/30/22 2348  BP:  127/73 115/73  Pulse:  (!) 116 91  Resp:  17 18  Temp:  98.3 F (36.8 C) 97.9 F (36.6 C)  TempSrc:  Oral Oral  SpO2: 100% 100% 98%  Weight:  99.7 kg   Height:  5\' 8"  (1.727 m)    No results found for this or any previous visit (from the past 24 hour(s)).  Dilation: 3 Effacement (%): 60 Station: -3 Exam by:: J.Bellamy, RN   FHR: 125 bpm, Mod Var, no Decels, 15x15 Accels UC: irregular   A: 1. False labor   2. [redacted] weeks gestation of pregnancy      P:  RN to discharge home in stable condition with return precautions & fetal kick counts  [email or web address], NP @T @ 11:51 PM

## 2022-07-30 NOTE — MAU Provider Note (Signed)
None      S: [name] is a 26 y.o. [phone] at [redacted]w[redacted]d  who presents to MAU today complaining contractions 8 pm. She denies vaginal bleeding. She denies LOF. She reports normal fetal movement.    O: BP 122/73 (BP Location: Right Arm)   Pulse (!) 119   Temp 98.4 F (36.9 C) (Oral)   Resp 16   Ht 5\' 8"  (1.727 m)   Wt 99.3 kg   LMP 10/09/2021   BMI 33.28 kg/m  GENERAL: Well-developed, well-nourished female in no acute distress.  HEAD: Normocephalic, atraumatic.  CHEST: Normal effort of breathing, regular heart rate ABDOMEN: Soft, nontender, gravid  Cervical exam:  Dilation: 2.5 Effacement (%): 70 Cervical Position: Middle Station: -2 Presentation: Vertex Exam by:: [email or web address] RN   Fetal Monitoring: Baseline: 135 Variability: moderate Accelerations: 15x15 Decelerations: variable Contractions: irregular  MDM Reviewed prenatal records scanned in media. Normal prenatal care thus far but patient has missed last few appointments due to work schedule. GBS PCR collected today while in MAU.   Decel x 1. Had more than an hour of reactive monitoring after fetal deceleration. Reviewed tracing with Dr. Ginnie Smart - ok for discharge home.  Dr. Despina Hidden notified of tracing & lack of prenatal appointments. Will have office call patient on Monday to schedule next appointment  Per patient (and TOCO) no longer contracting & declines repeat cervical exam.   A: SIUP at [redacted]w[redacted]d  False labor  P: Discharge home Labor precautions & kick counts Office will call Monday for f/u appointment GBS PCR pending  Monday, NP 07/30/2022 12:21 AM

## 2022-07-31 DIAGNOSIS — O471 False labor at or after 37 completed weeks of gestation: Secondary | ICD-10-CM | POA: Diagnosis not present

## 2022-07-31 DIAGNOSIS — Z3A38 38 weeks gestation of pregnancy: Secondary | ICD-10-CM | POA: Diagnosis not present

## 2022-08-01 ENCOUNTER — Encounter (HOSPITAL_COMMUNITY): Payer: Self-pay | Admitting: Obstetrics and Gynecology

## 2022-08-01 ENCOUNTER — Inpatient Hospital Stay (HOSPITAL_COMMUNITY)
Admission: AD | Admit: 2022-08-01 | Discharge: 2022-08-01 | Disposition: A | Discharge status: Home / Self Care | Payer: Medicaid Other | Attending: Obstetrics and Gynecology | Admitting: Obstetrics and Gynecology

## 2022-08-01 ENCOUNTER — Other Ambulatory Visit: Payer: Self-pay

## 2022-08-01 DIAGNOSIS — O471 False labor at or after 37 completed weeks of gestation: Secondary | ICD-10-CM | POA: Diagnosis not present

## 2022-08-01 DIAGNOSIS — Z3A38 38 weeks gestation of pregnancy: Secondary | ICD-10-CM

## 2022-08-01 NOTE — MAU Note (Signed)
I have communicated with Antony Odea, CNM and reviewed vital signs:  Vitals:   08/01/22 2236 08/01/22 2311  BP: 133/72 126/73  Pulse: (!) 115 94  Resp: (!) 24 12  Temp: 98.3 F (36.8 C)   SpO2: 99%     Vaginal exam:  Dilation: 3 Effacement (%): 60 Presentation: Vertex Exam by:: Anastasio Champion, RN,   Also reviewed contraction pattern and that non-stress test is reactive.  It has been documented that patient is irregularly  contracting with patient stating she has not felt a contraction since 2145 not indicating active labor. Pt was offered a second cervix check or go home. Pt prefers to go home.  Patient denies any other complaints.  Based on this report provider has given order for discharge.  A discharge order and diagnosis entered by a provider.   Labor discharge instructions reviewed with patient.

## 2022-08-01 NOTE — MAU Provider Note (Signed)
  S: [name] is a 26 y.o. [phone] at [redacted]w[redacted]d  who presents to MAU today complaining contractions q 5 minutes. She reports she has not had a contraction since 2145.    She denies vaginal bleeding. She denies LOF. She reports normal fetal movement.    O: BP 133/72 (BP Location: Left Arm)   Pulse (!) 115   Temp 98.3 F (36.8 C)   Resp (!) 24   LMP 10/09/2021   SpO2 99%  GENERAL: Well-developed, well-nourished female in no acute distress.  HEAD: Normocephalic, atraumatic.  CHEST: Normal effort of breathing, regular heart rate ABDOMEN: Soft, nontender, gravid  Cervical exam:  Dilation: 3 Effacement (%): 60 Presentation: Vertex Exam by:: Anastasio Champion, RN   Fetal Monitoring: Baseline: 125 Variability: moderate Accelerations: 15x15 Decelerations: none Contractions: none  Offered to recheck in 1 hour or d/c home. Patient desires discharge since she is not contracting anymore   A: SIUP at [redacted]w[redacted]d  False labor  P: -Discharge home in stable condition -Labor precautions discussed -Patient advised to follow-up with OB as scheduled for prenatal care -Patient may return to MAU as needed or if her condition were to change or worsen  Rolm Bookbinder, [place] 08/01/2022 11:08 PM

## 2022-08-01 NOTE — MAU Note (Signed)
.  Pamela Jefferson is a 26 y.o. at [redacted]w[redacted]d here in MAU reporting: ctx's since 1800 reports they are 4-5 minutes apart. Denies vaginal bleeding or LOF.  Reports +FM   Onset of complaint: today 1800 Pain score: 8/10  There were no vitals filed for this visit.    Lab orders placed from triage:   none

## 2022-08-01 NOTE — Discharge Instructions (Signed)

## 2022-08-05 ENCOUNTER — Inpatient Hospital Stay (HOSPITAL_COMMUNITY)
Admission: AD | Admit: 2022-08-05 | Discharge: 2022-08-05 | Disposition: A | Discharge status: Home / Self Care | Payer: Medicaid Other | Attending: Obstetrics and Gynecology | Admitting: Obstetrics and Gynecology

## 2022-08-05 ENCOUNTER — Other Ambulatory Visit: Payer: Self-pay

## 2022-08-05 ENCOUNTER — Encounter (HOSPITAL_COMMUNITY): Payer: Self-pay | Admitting: Obstetrics and Gynecology

## 2022-08-05 DIAGNOSIS — R102 Pelvic and perineal pain: Secondary | ICD-10-CM | POA: Insufficient documentation

## 2022-08-05 DIAGNOSIS — Z3A39 39 weeks gestation of pregnancy: Secondary | ICD-10-CM | POA: Diagnosis not present

## 2022-08-05 DIAGNOSIS — O26893 Other specified pregnancy related conditions, third trimester: Secondary | ICD-10-CM | POA: Insufficient documentation

## 2022-08-05 DIAGNOSIS — O471 False labor at or after 37 completed weeks of gestation: Secondary | ICD-10-CM | POA: Diagnosis not present

## 2022-08-05 LAB — POCT FERN TEST: POCT Fern Test: NEGATIVE

## 2022-08-05 NOTE — Discharge Instructions (Signed)

## 2022-08-05 NOTE — MAU Provider Note (Signed)
Event Date/Time   First Provider Initiated Contact with Patient 08/05/22 1041     S: [name] is a 26 y.o. [phone] at [redacted]w[redacted]d  who presents to MAU today complaining of pain in her vagina when she walks. She denies vaginal bleeding. She denies LOF. She reports normal fetal movement.    Upon assessment, patient states she is here because she wants to be induced. She states her office is telling her she has to wait for 41 weeks and she does not want to do that. She states she is miserable and uncomfortable and wants to be induced.   O: BP 112/77 (BP Location: Right Arm)   Pulse (!) 105   Temp 98.3 F (36.8 C) (Oral)   Resp 19   Ht 5\' 8"  (1.727 m)   Wt 100 kg   LMP 10/09/2021   SpO2 100%   BMI 33.53 kg/m  GENERAL: Well-developed, well-nourished female in no acute distress.  HEAD: Normocephalic, atraumatic.  CHEST: Normal effort of breathing, regular heart rate ABDOMEN: Soft, nontender, gravid  Cervical exam:  Dilation: 3.5 Effacement (%): 60 Station: -3 Exam by:: Berdella Bacot cnm   Fetal Monitoring: Baseline: 130 Variability: moderate Accelerations: 15x15 Decelerations: none Contractions: UI   A: SIUP at [redacted]w[redacted]d  False labor  CNM discussed with patient that she is not able to electively induce from MAU as CNM is not a part of her practice. Encouraged patient to have this discussion with OB at next prenatal appointment.    P: -Discharge home in stable condition -Labor precautions discussed -Patient advised to follow-up with OB as scheduled for prenatal care -Patient may return to MAU as needed or if her condition were to change or worsen   [redacted]w[redacted]d, Rolm Bookbinder 08/05/2022 10:52 AM

## 2022-08-05 NOTE — MAU Note (Signed)
Pamela Jefferson is a 26 y.o. at [redacted]w[redacted]d here in MAU reporting: She had regular ctxs last night, but not currently having ctxs.    Also reports has vaginal pain, states worse with walking.  Reports had leaking last night, unsure if water broke.  Denies VB.  Endorses +FM. LMP: NA Onset of complaint: yesterday Pain score: 9 Vitals:   08/05/22 1010  BP: 112/77  Pulse: (!) 105  Resp: 19  Temp: 98.3 F (36.8 C)  SpO2: 100%     FHT:144 bpm Lab orders placed from triage:   None

## 2022-08-09 ENCOUNTER — Encounter (HOSPITAL_COMMUNITY): Payer: Self-pay | Admitting: *Deleted

## 2022-08-09 ENCOUNTER — Telehealth (HOSPITAL_COMMUNITY): Payer: Self-pay | Admitting: *Deleted

## 2022-08-09 NOTE — Telephone Encounter (Signed)
Preadmission screen  

## 2022-08-13 ENCOUNTER — Inpatient Hospital Stay (HOSPITAL_COMMUNITY): Payer: Medicaid Other | Admitting: Anesthesiology

## 2022-08-13 ENCOUNTER — Other Ambulatory Visit: Payer: Self-pay

## 2022-08-13 ENCOUNTER — Encounter (HOSPITAL_COMMUNITY): Payer: Self-pay | Admitting: Obstetrics and Gynecology

## 2022-08-13 ENCOUNTER — Inpatient Hospital Stay (HOSPITAL_COMMUNITY): Payer: Medicaid Other

## 2022-08-13 ENCOUNTER — Inpatient Hospital Stay (HOSPITAL_COMMUNITY)
Admission: AD | Admit: 2022-08-13 | Discharge: 2022-08-14 | DRG: 806 | Disposition: A | Discharge status: Home / Self Care | Payer: Medicaid Other | Attending: Obstetrics and Gynecology | Admitting: Obstetrics and Gynecology

## 2022-08-13 DIAGNOSIS — Z87891 Personal history of nicotine dependence: Secondary | ICD-10-CM

## 2022-08-13 DIAGNOSIS — D649 Anemia, unspecified: Secondary | ICD-10-CM | POA: Diagnosis not present

## 2022-08-13 DIAGNOSIS — O99214 Obesity complicating childbirth: Secondary | ICD-10-CM | POA: Diagnosis not present

## 2022-08-13 DIAGNOSIS — D62 Acute posthemorrhagic anemia: Secondary | ICD-10-CM | POA: Diagnosis not present

## 2022-08-13 DIAGNOSIS — Z8711 Personal history of peptic ulcer disease: Secondary | ICD-10-CM

## 2022-08-13 DIAGNOSIS — Z3490 Encounter for supervision of normal pregnancy, unspecified, unspecified trimester: Secondary | ICD-10-CM | POA: Diagnosis not present

## 2022-08-13 DIAGNOSIS — Z3A40 40 weeks gestation of pregnancy: Secondary | ICD-10-CM | POA: Diagnosis not present

## 2022-08-13 DIAGNOSIS — O480 Post-term pregnancy: Principal | ICD-10-CM | POA: Diagnosis present

## 2022-08-13 DIAGNOSIS — O9902 Anemia complicating childbirth: Secondary | ICD-10-CM | POA: Diagnosis not present

## 2022-08-13 DIAGNOSIS — O9081 Anemia of the puerperium: Secondary | ICD-10-CM | POA: Diagnosis not present

## 2022-08-13 LAB — RPR: RPR Ser Ql: NONREACTIVE

## 2022-08-13 LAB — TYPE AND SCREEN
ABO/RH(D): B POS
Antibody Screen: NEGATIVE

## 2022-08-13 LAB — CBC
HCT: 31.1 % — ABNORMAL LOW (ref 36.0–46.0)
Hemoglobin: 10.6 g/dL — ABNORMAL LOW (ref 12.0–15.0)
MCH: 32.3 pg (ref 26.0–34.0)
MCHC: 34.1 g/dL (ref 30.0–36.0)
MCV: 94.8 fL (ref 80.0–100.0)
Platelets: 377 10*3/uL (ref 150–400)
RBC: 3.28 MIL/uL — ABNORMAL LOW (ref 3.87–5.11)
RDW: 15.3 % (ref 11.5–15.5)
WBC: 12.1 10*3/uL — ABNORMAL HIGH (ref 4.0–10.5)
nRBC: 0 % (ref 0.0–0.2)

## 2022-08-13 MED ORDER — PHENYLEPHRINE 80 MCG/ML (10ML) SYRINGE FOR IV PUSH (FOR BLOOD PRESSURE SUPPORT): 80.0000 ug | PREFILLED_SYRINGE | INTRAVENOUS | Status: DC | PRN

## 2022-08-13 MED ORDER — FENTANYL-BUPIVACAINE-NACL 0.5-0.125-0.9 MG/250ML-% EP SOLN
12.0000 mL/h | EPIDURAL | Status: DC | PRN
  Administered 2022-08-13: 12 mL/h via EPIDURAL
  Filled 2022-08-13: qty 250

## 2022-08-13 MED ORDER — LACTATED RINGERS IV SOLN: INTRAVENOUS | Status: DC

## 2022-08-13 MED ORDER — OXYTOCIN-SODIUM CHLORIDE 30-0.9 UT/500ML-% IV SOLN: 2.5000 [IU]/h | INTRAVENOUS | Status: DC | PRN

## 2022-08-13 MED ORDER — DIBUCAINE (PERIANAL) 1 % EX OINT: 1.0000 | TOPICAL_OINTMENT | CUTANEOUS | Status: DC | PRN

## 2022-08-13 MED ORDER — FLEET ENEMA 7-19 GM/118ML RE ENEM: 1.0000 | ENEMA | RECTAL | Status: DC | PRN

## 2022-08-13 MED ORDER — SOD CITRATE-CITRIC ACID 500-334 MG/5ML PO SOLN
30.0000 mL | ORAL | Status: DC | PRN
  Administered 2022-08-13: 30 mL via ORAL
  Filled 2022-08-13: qty 30

## 2022-08-13 MED ORDER — FENTANYL CITRATE (PF) 100 MCG/2ML IJ SOLN
100.0000 ug | INTRAMUSCULAR | Status: DC | PRN
  Administered 2022-08-13: 100 ug via INTRAVENOUS
  Filled 2022-08-13: qty 2

## 2022-08-13 MED ORDER — IBUPROFEN 600 MG PO TABS
600.0000 mg | ORAL_TABLET | Freq: Four times a day (QID) | ORAL | Status: DC
  Administered 2022-08-14 (×2): 600 mg via ORAL
  Filled 2022-08-13 (×3): qty 1

## 2022-08-13 MED ORDER — LIDOCAINE HCL (PF) 1 % IJ SOLN: 30.0000 mL | INTRAMUSCULAR | Status: DC | PRN

## 2022-08-13 MED ORDER — DIPHENHYDRAMINE HCL 25 MG PO CAPS: 25.0000 mg | ORAL_CAPSULE | Freq: Four times a day (QID) | ORAL | Status: DC | PRN

## 2022-08-13 MED ORDER — WITCH HAZEL-GLYCERIN EX PADS
1.0000 | MEDICATED_PAD | CUTANEOUS | Status: DC | PRN
  Administered 2022-08-13: 1 via TOPICAL

## 2022-08-13 MED ORDER — OXYCODONE HCL 5 MG PO TABS
10.0000 mg | ORAL_TABLET | ORAL | Status: DC | PRN
  Administered 2022-08-14 (×2): 10 mg via ORAL
  Filled 2022-08-13 (×2): qty 2

## 2022-08-13 MED ORDER — ZOLPIDEM TARTRATE 5 MG PO TABS: 5.0000 mg | ORAL_TABLET | Freq: Every evening | ORAL | Status: DC | PRN

## 2022-08-13 MED ORDER — PHENYLEPHRINE 80 MCG/ML (10ML) SYRINGE FOR IV PUSH (FOR BLOOD PRESSURE SUPPORT)
80.0000 ug | PREFILLED_SYRINGE | INTRAVENOUS | Status: DC | PRN
  Filled 2022-08-13: qty 10

## 2022-08-13 MED ORDER — LACTATED RINGERS IV SOLN: 500.0000 mL | INTRAVENOUS | Status: DC | PRN

## 2022-08-13 MED ORDER — SENNOSIDES-DOCUSATE SODIUM 8.6-50 MG PO TABS
2.0000 | ORAL_TABLET | Freq: Every day | ORAL | Status: DC
  Administered 2022-08-14: 2 via ORAL
  Filled 2022-08-13: qty 2

## 2022-08-13 MED ORDER — OXYCODONE HCL 5 MG PO TABS
5.0000 mg | ORAL_TABLET | ORAL | Status: DC | PRN
  Administered 2022-08-13 (×2): 5 mg via ORAL
  Filled 2022-08-13 (×2): qty 1

## 2022-08-13 MED ORDER — ONDANSETRON HCL 4 MG/2ML IJ SOLN: 4.0000 mg | INTRAMUSCULAR | Status: DC | PRN

## 2022-08-13 MED ORDER — OXYTOCIN BOLUS FROM INFUSION
333.0000 mL | Freq: Once | INTRAVENOUS | Status: AC
  Administered 2022-08-13: 333 mL via INTRAVENOUS

## 2022-08-13 MED ORDER — OXYTOCIN-SODIUM CHLORIDE 30-0.9 UT/500ML-% IV SOLN
2.5000 [IU]/h | INTRAVENOUS | Status: DC
  Administered 2022-08-13: 2.5 [IU]/h via INTRAVENOUS

## 2022-08-13 MED ORDER — SIMETHICONE 80 MG PO CHEW: 80.0000 mg | CHEWABLE_TABLET | ORAL | Status: DC | PRN

## 2022-08-13 MED ORDER — LACTATED RINGERS IV SOLN
500.0000 mL | Freq: Once | INTRAVENOUS | Status: AC
  Administered 2022-08-13: 500 mL via INTRAVENOUS

## 2022-08-13 MED ORDER — PRENATAL MULTIVITAMIN CH
1.0000 | ORAL_TABLET | Freq: Every day | ORAL | Status: DC
  Administered 2022-08-14: 1 via ORAL
  Filled 2022-08-13: qty 1

## 2022-08-13 MED ORDER — ONDANSETRON HCL 4 MG/2ML IJ SOLN
4.0000 mg | Freq: Four times a day (QID) | INTRAMUSCULAR | Status: DC | PRN
  Administered 2022-08-13: 4 mg via INTRAVENOUS
  Filled 2022-08-13: qty 2

## 2022-08-13 MED ORDER — OXYCODONE-ACETAMINOPHEN 5-325 MG PO TABS: 1.0000 | ORAL_TABLET | ORAL | Status: DC | PRN

## 2022-08-13 MED ORDER — BENZOCAINE-MENTHOL 20-0.5 % EX AERO
1.0000 | INHALATION_SPRAY | CUTANEOUS | Status: DC | PRN
  Administered 2022-08-13: 1 via TOPICAL
  Filled 2022-08-13: qty 56

## 2022-08-13 MED ORDER — ACETAMINOPHEN 325 MG PO TABS
650.0000 mg | ORAL_TABLET | ORAL | Status: DC | PRN
  Administered 2022-08-13 – 2022-08-14 (×3): 650 mg via ORAL
  Filled 2022-08-13 (×3): qty 2

## 2022-08-13 MED ORDER — OXYTOCIN-SODIUM CHLORIDE 30-0.9 UT/500ML-% IV SOLN
1.0000 m[IU]/min | INTRAVENOUS | Status: DC
  Administered 2022-08-13: 1 m[IU]/min via INTRAVENOUS
  Filled 2022-08-13: qty 500

## 2022-08-13 MED ORDER — DIPHENHYDRAMINE HCL 50 MG/ML IJ SOLN: 12.5000 mg | INTRAMUSCULAR | Status: DC | PRN

## 2022-08-13 MED ORDER — ACETAMINOPHEN 325 MG PO TABS: 650.0000 mg | ORAL_TABLET | ORAL | Status: DC | PRN

## 2022-08-13 MED ORDER — COCONUT OIL OIL: 1.0000 | TOPICAL_OIL | Status: DC | PRN

## 2022-08-13 MED ORDER — LIDOCAINE HCL (PF) 1 % IJ SOLN
INTRAMUSCULAR | Status: DC | PRN
  Administered 2022-08-13 (×2): 5 mL via EPIDURAL

## 2022-08-13 MED ORDER — TETANUS-DIPHTH-ACELL PERTUSSIS 5-2.5-18.5 LF-MCG/0.5 IM SUSY: 0.5000 mL | PREFILLED_SYRINGE | Freq: Once | INTRAMUSCULAR | Status: DC

## 2022-08-13 MED ORDER — EPHEDRINE 5 MG/ML INJ: 10.0000 mg | INTRAVENOUS | Status: DC | PRN

## 2022-08-13 MED ORDER — TERBUTALINE SULFATE 1 MG/ML IJ SOLN: 0.2500 mg | Freq: Once | INTRAMUSCULAR | Status: DC | PRN

## 2022-08-13 MED ORDER — OXYCODONE-ACETAMINOPHEN 5-325 MG PO TABS: 2.0000 | ORAL_TABLET | ORAL | Status: DC | PRN

## 2022-08-13 MED ORDER — ONDANSETRON HCL 4 MG PO TABS: 4.0000 mg | ORAL_TABLET | ORAL | Status: DC | PRN

## 2022-08-13 NOTE — Anesthesia Preprocedure Evaluation (Signed)
Anesthesia Evaluation  Patient identified by MRN, date of birth, ID band Patient awake    Reviewed: Allergy & Precautions, Patient's Chart, lab work & pertinent test results  Airway Mallampati: II       Dental no notable dental hx.    Pulmonary former smoker   Pulmonary exam normal        Cardiovascular negative cardio ROS Normal cardiovascular exam     Neuro/Psych  PSYCHIATRIC DISORDERS Anxiety Depression    negative neurological ROS     GI/Hepatic Neg liver ROS,GERD  ,,  Endo/Other  Obesity  Renal/GU negative Renal ROS  negative genitourinary   Musculoskeletal negative musculoskeletal ROS (+)    Abdominal  (+) + obese  Peds  Hematology  (+) Blood dyscrasia, anemia   Anesthesia Other Findings   Reproductive/Obstetrics (+) Pregnancy                             Anesthesia Physical Anesthesia Plan  ASA: 2  Anesthesia Plan:    Post-op Pain Management: Epidural* and Minimal or no pain anticipated   Induction:   PONV Risk Score and Plan:   Airway Management Planned: Natural Airway  Additional Equipment:   Intra-op Plan:   Post-operative Plan:   Informed Consent: I have reviewed the patients History and Physical, chart, labs and discussed the procedure including the risks, benefits and alternatives for the proposed anesthesia with the patient or authorized representative who has indicated his/her understanding and acceptance.       Plan Discussed with: Anesthesiologist  Anesthesia Plan Comments:        Anesthesia Quick Evaluation

## 2022-08-13 NOTE — Anesthesia Procedure Notes (Signed)
Epidural Patient location during procedure: OB Start time: 08/13/2022 4:27 AM End time: 08/13/2022 4:34 AM  Staffing Anesthesiologist: Mal Amabile, MD Performed: anesthesiologist   Preanesthetic Checklist Completed: patient identified, IV checked, site marked, risks and benefits discussed, surgical consent, monitors and equipment checked, pre-op evaluation and timeout performed  Epidural Patient position: sitting Prep: DuraPrep and site prepped and draped Patient monitoring: continuous pulse ox and blood pressure Approach: midline Location: L3-L4 Injection technique: LOR air  Needle:  Needle type: Tuohy  Needle gauge: 17 G Needle length: 9 cm and 9 Needle insertion depth: 4 cm Catheter type: closed end flexible Catheter size: 19 Gauge Catheter at skin depth: 9 cm Test dose: negative and Other  Assessment Events: blood not aspirated, injection not painful, no injection resistance, no paresthesia and negative IV test  Additional Notes Patient identified. Risks and benefits discussed including failed block, incomplete  Pain control, post dural puncture headache, nerve damage, paralysis, blood pressure Changes, nausea, vomiting, reactions to medications-both toxic and allergic and post Partum back pain. All questions were answered. Patient expressed understanding and wished to proceed. Sterile technique was used throughout procedure. Epidural site was Dressed with sterile barrier dressing. No paresthesias, signs of intravascular injection Or signs of intrathecal spread were encountered.  Patient was more comfortable after the epidural was dosed. Please see RN's note for documentation of vital signs and FHR which are stable. Reason for block:procedure for pain

## 2022-08-13 NOTE — H&P (Signed)
Pamela Jefferson is a 26 y.o. female [phone] at [redacted]w[redacted]d presenting for elective IOL.  OB History     Gravida  5   Para  3   Term  3   Preterm      AB  1   Living  3      SAB  1   IAB      Ectopic      Multiple  0   Live Births  3          Past Medical History:  Diagnosis Date   Anemia    Anxiety    Chlamydia    Depression    "about ready to give up:", no help, just tired   Gonorrhea    Hx of gastric ulcer    Past Surgical History:  Procedure Laterality Date   FRACTURE SURGERY     left arm   Family History: family history includes Cancer in her maternal grandfather; Diabetes in her maternal grandfather; Hip fracture in her maternal grandmother; Neurologic Disorder in her mother. Social History:  reports that she quit smoking about 4 months ago. Her smoking use included cigarettes. She has a 1.00 pack-year smoking history. She has never used smokeless tobacco. She reports that she does not drink alcohol and does not use drugs.     Maternal Diabetes: No Genetic Screening: Normal Maternal Ultrasounds/Referrals: Normal Fetal Ultrasounds or other Referrals:  None Maternal Substance Abuse:  No Significant Maternal Medications:  None Significant Maternal Lab Results:  Group B Strep negative Number of Prenatal Visits:greater than 3 verified prenatal visits Other Comments:  None  Review of Systems  Constitutional: Negative.   HENT: Negative.    Eyes: Negative.   Respiratory: Negative.    Cardiovascular: Negative.   Gastrointestinal: Negative.   Endocrine: Negative.   Genitourinary: Negative.   All other systems reviewed and are negative.  Maternal Medical History:  Contractions: Frequency: irregular.   Fetal activity: Perceived fetal activity is normal.   Prenatal complications: no prenatal complications Prenatal Complications - Diabetes: none.   Dilation: 4 Effacement (%): 60 Station: -2 Exam by:: Pamela Turner RN Blood pressure 118/69, pulse  (!) 101, temperature 97.8 F (36.6 C), temperature source Oral, resp. rate 16, height 5\' 8"  (1.727 m), weight 100.7 kg, last menstrual period 10/09/2021, unknown if currently breastfeeding. Maternal Exam:  Uterine Assessment: Contraction strength is mild.  Contraction duration is 1 minute. Contraction frequency is rare.  Abdomen: not evaluated.  Vertex per RN on cervical check Introitus: not evaluated.   Pelvis: adequate for delivery.   Cervix: Cervix evaluated by digital exam.     Fetal Exam Fetal Monitor Review: Baseline rate: 135.  Variability: moderate (6-25 bpm).   Pattern: accelerations present and no decelerations.   Fetal State Assessment: Category I - tracings are normal.   Physical Exam Vitals reviewed.  Constitutional:      Appearance: Normal appearance.  Cardiovascular:     Rate and Rhythm: Normal rate.  Pulmonary:     Effort: Pulmonary effort is normal. No respiratory distress.  Abdominal:     Comments: Gravid uterus  Neurological:     General: No focal deficit present.     Mental Status: She is alert and oriented to person, place, and time.  Psychiatric:        Mood and Affect: Mood normal.     Prenatal labs: ABO, Rh: --/--/B POS (11/25 0040) Antibody: NEG (11/25 0040) Rubella: Immune (06/13 0000) RPR: NON REACTIVE (12/15 1200)  HBsAg:  Negative (06/13 0000)  HIV: Non-reactive (06/13 0000)  GBS: NEGATIVE/-- (11/10 2300)   Assessment/Plan: Proceed with elective IOL IOL with pitocin per protocol CLD CEFM/TOCO Patient may have epidural upon request  Anticipate vaginal delivery  Travion Ke D Teran Knittle 08/13/2022, 2:30 AM

## 2022-08-14 DIAGNOSIS — D62 Acute posthemorrhagic anemia: Secondary | ICD-10-CM | POA: Diagnosis not present

## 2022-08-14 LAB — CBC
HCT: 29 % — ABNORMAL LOW (ref 36.0–46.0)
Hemoglobin: 9.9 g/dL — ABNORMAL LOW (ref 12.0–15.0)
MCH: 31.9 pg (ref 26.0–34.0)
MCHC: 34.1 g/dL (ref 30.0–36.0)
MCV: 93.5 fL (ref 80.0–100.0)
Platelets: 314 10*3/uL (ref 150–400)
RBC: 3.1 MIL/uL — ABNORMAL LOW (ref 3.87–5.11)
RDW: 14.5 % (ref 11.5–15.5)
WBC: 15.8 10*3/uL — ABNORMAL HIGH (ref 4.0–10.5)
nRBC: 0 % (ref 0.0–0.2)

## 2022-08-14 MED ORDER — POLYSACCHARIDE IRON COMPLEX 150 MG PO CAPS: 150.0000 mg | ORAL_CAPSULE | Freq: Every day | ORAL | 2 refills | Status: AC

## 2022-08-14 MED ORDER — POLYSACCHARIDE IRON COMPLEX 150 MG PO CAPS
150.0000 mg | ORAL_CAPSULE | Freq: Every day | ORAL | Status: DC
  Administered 2022-08-14: 150 mg via ORAL
  Filled 2022-08-14: qty 1

## 2022-08-14 MED ORDER — IBUPROFEN 600 MG PO TABS: 600.0000 mg | ORAL_TABLET | Freq: Four times a day (QID) | ORAL | 0 refills | Status: DC

## 2022-08-14 NOTE — Discharge Summary (Signed)
SVD OB Discharge Summary       Patient Name: Pamela Jefferson DOB: 09-Sep-1996 MRN: [id number]  Date of admission: 08/13/2022 Delivering MD: Osborn Coho  Date of delivery: 08/13/2022 Type of delivery: SVD  Newborn Data: Sex: Baby Female Circumcision: desires prior to dicharge Live born female  Birth Weight: 8 lb 3.6 oz (3730 g) APGAR: 9, 9  Newborn Delivery   Birth date/time: 08/13/2022 12:18:00 Delivery type: Vaginal, Spontaneous      Feeding: breast and bottle Infant being discharge to home with mother in stable condition.   Admitting diagnosis: Pregnancy [Z34.90] Intrauterine pregnancy: [redacted]w[redacted]d     Secondary diagnosis:  Principal Problem:   Encounter for elective induction of labor Active Problems:   SVD (spontaneous vaginal delivery)   Normal postpartum course   Acute blood loss anemia                                Complications: None                                                              Intrapartum Procedures: spontaneous vaginal delivery Postpartum Procedures: none Complications-Operative and Postpartum:  2nd degree perineal laceration Augmentation: Pitocin   History of Present Illness: [name] is a 26 y.o. female, [id number], who presents at [redacted]w[redacted]d weeks gestation. The patient has been followed at  [facility] and Gynecology  Her pregnancy has been complicated by:  Patient Active Problem List   Diagnosis Date Noted   Acute blood loss anemia 08/14/2022   SVD (spontaneous vaginal delivery) 08/13/2022   Normal postpartum course 08/13/2022   Encounter for elective induction of labor 09/02/2021   Depression      Active Ambulatory Problems    Diagnosis Date Noted   Depression    Encounter for elective induction of labor 09/02/2021   Resolved Ambulatory Problems    Diagnosis Date Noted   Preterm contractions 09/15/2014   Supervision of normal first teen pregnancy in third trimester 09/16/2014   Insufficient prenatal  care in third trimester 09/16/2014   Chlamydia infection affecting pregnancy in third trimester 09/18/2014   Anemia affecting pregnancy in third trimester 09/24/2014   Active labor at term 10/27/2014   Severe major depression without psychotic features (HCC) 01/20/2016   MDD (major depressive disorder), single episode, severe (HCC) 01/20/2016   Suicidal ideation    Suicidal ideations    Preterm labor 03/05/2018   Abdominal pain 03/11/2018   Round ligament pain 03/11/2018   Pregnancy complicated by umbilical cord varix, antepartum, not applicable or unspecified fetus 04/09/2018   Supervision of other normal pregnancy, antepartum 04/22/2021   Anemia of pregnancy 08/21/2021   Vaginal delivery 09/03/2021   Past Medical History:  Diagnosis Date   Anemia    Anxiety    Chlamydia    Gonorrhea    Hx of gastric ulcer      Hospital course:  Induction of Labor With Vaginal Delivery   26 y.o. yo [id number] at [redacted]w[redacted]d was admitted to the hospital 08/13/2022 for induction of labor.  Indication for induction: Favorable cervix at term.  Patient had an labor course complicated bypt was admitted on 11/25 for elctive IOL with pitocin had SVD on 11/25 over  2nd degree repiaired, ebl was , hgb drop of 10.6-9.9, asymptomatic on po iron. H/O PPD with SI, no meds, disscussed starting ssri pt declined, discussed risk of PPRD and si relapse, pt verbalized aware, mood stable, denies si/hi.  Membrane Rupture Time/Date: 12:09 PM ,08/13/2022   Delivery Method:Vaginal, Spontaneous  Episiotomy: None  Lacerations:  2nd degree;Periurethral  Details of delivery can be found in separate delivery note.  Patient had a postpartum course complicated byN/A. Patient is discharged home 08/14/22.  Newborn Data: Birth date:08/13/2022  Birth time:12:18 PM  Gender:Female  Living status:Living  Apgars:9 ,9  Weight:3730 g  Postpartum Day # 1 : S/P NSVD due to Elective IOL. Patient up ad lib, denies syncope or dizziness. Reports  consuming regular diet without issues and denies N/V. Patient reports + bowel movement + passing flatus.  Denies issues with urination and reports bleeding is "lighter."  Patient is br bt feeding and reports going well.  Desires BTL 6 weeks for postpartum contraception.  Pain is being appropriately managed with use of po meds.   Physical exam  Vitals:   08/13/22 1545 08/13/22 2002 08/13/22 2320 08/14/22 0340  BP: 120/72 120/74 120/70 (!) 98/56  Pulse: 83 79 79 70  Resp: 16 18 18 18   Temp: 97.7 F (36.5 C) 98.3 F (36.8 C) 98.5 F (36.9 C) 97.8 F (36.6 C)  TempSrc: Oral Oral Oral Oral  SpO2: 100%     Weight:      Height:       General: alert, cooperative, and no distress Lochia: appropriate Uterine Fundus: firm Perineum: approximate DVT Evaluation: No evidence of DVT seen on physical exam. Negative Homan's sign. No cords or calf tenderness. No significant calf/ankle edema.  Labs: Lab Results  Component Value Date   WBC 15.8 (H) 08/14/2022   HGB 9.9 (L) 08/14/2022   HCT 29.0 (L) 08/14/2022   MCV 93.5 08/14/2022   PLT 314 08/14/2022      Latest Ref Rng & Units 01/23/2018   12:45 AM  CMP  Glucose 65 - 99 mg/dL 93   BUN 6 - 20 mg/dL 9   Creatinine 03/25/2018 - 7.51 mg/dL 7.00   Sodium 1.74 - 944 mmol/L 136   Potassium 3.5 - 5.1 mmol/L 3.4   Chloride 101 - 111 mmol/L 106   CO2 22 - 32 mmol/L 21   Calcium 8.9 - 10.3 mg/dL 8.4   Total Protein 6.5 - 8.1 g/dL 6.9   Total Bilirubin 0.3 - 1.2 mg/dL 0.2   Alkaline Phos 38 - 126 U/L 73   AST 15 - 41 U/L 13   ALT 14 - 54 U/L 11     Date of discharge: 08/14/2022 Discharge Diagnoses: Term Pregnancy-delivered Discharge instruction: per After Visit Summary and "Baby and Me Booklet".  After visit meds:   Activity:           unrestricted and pelvic rest Advance as tolerated. Pelvic rest for 6 weeks.  Diet:                routine Medications: PNV, Ibuprofen, Colace, and Iron Postpartum contraception: Tubal Ligation Condition:  Pt  discharge to home with baby in stable Anemia: PO Iron H/O PPD, monitor mood, 1 week mood check.   Meds: Allergies as of 08/14/2022       Reactions   Iodine Anaphylaxis   Shellfish Allergy Anaphylaxis        Medication List     TAKE these medications    ibuprofen 600 MG  tablet Commonly known as: ADVIL Take 1 tablet (600 mg total) by mouth every 6 (six) hours.   iron polysaccharides 150 MG capsule Commonly known as: NIFEREX Take 1 capsule (150 mg total) by mouth daily.   prenatal multivitamin Tabs tablet Take 2 tablets by mouth daily.        Discharge Follow Up:   Follow-up Information     [facility]. Schedule an appointment as soon as possible for a visit in 1 week(s).   Specialty: Obstetrics and Gynecology Why: 1 week Mood check 4 week mood check and 6 weeks mood check. Can be Hotel manager information: [address] [place] [address] [phone]        [facility] Follow up.   Specialty: Urgent Care Why: WALK-IN [facility] URGENT CARE 24/7  CRISIS LINES: 988  National Maternal Mental Health Hotline at 1-833-TLC-MAMA (907) 613-4354) Free, confidential, 24/7 mental health support for moms and their families before, during, and after pregnancy. Contact information: [address] [place] [address] [phone]                 [facility] CNM, FNP-C, PMHNP-BC  [address]  [place], [place] [address]  Cell: [phone]  Office Phone: [phone] Fax: [phone] 08/14/2022  1:26 PM

## 2022-08-14 NOTE — Anesthesia Postprocedure Evaluation (Signed)
Anesthesia Post Note  Patient: Pamela Jefferson  Procedure(s) Performed: AN AD HOC LABOR EPIDURAL     Patient location during evaluation: Mother Baby Anesthesia Type: Epidural Level of consciousness: awake and alert and oriented Pain management: satisfactory to patient Vital Signs Assessment: post-procedure vital signs reviewed and stable Respiratory status: respiratory function stable Cardiovascular status: stable Postop Assessment: no headache, no backache, epidural receding, patient able to bend at knees, no signs of nausea or vomiting, adequate PO intake and able to ambulate Anesthetic complications: no   No notable events documented.  Last Vitals:  Vitals:   08/13/22 2320 08/14/22 0340  BP: 120/70 (!) 98/56  Pulse: 79 70  Resp: 18 18  Temp: 36.9 C 36.6 C  SpO2:      Last Pain:  Vitals:   08/14/22 0839  TempSrc:   PainSc: 6    Pain Goal:                   Karleen Dolphin

## 2022-08-14 NOTE — Plan of Care (Signed)
  Problem: Education: Goal: Knowledge of condition will improve Outcome: Completed/Met   Problem: Activity: Goal: Will verbalize the importance of balancing activity with adequate rest periods Outcome: Completed/Met Goal: Ability to tolerate increased activity will improve Outcome: Completed/Met   Problem: Life Cycle: Goal: Chance of risk for complications during the postpartum period will decrease Outcome: Completed/Met   Problem: Role Relationship: Goal: Ability to demonstrate positive interaction with newborn will improve Outcome: Completed/Met   Problem: Skin Integrity: Goal: Demonstration of wound healing without infection will improve Outcome: Completed/Met   

## 2022-08-14 NOTE — Progress Notes (Signed)
CSW received consult for hx of Anxiety and Depression.  CSW met with MOB to offer support and complete assessment. When CSW entered room, MOB was observed sitting in hospital bed. Infant "Savyion" was observed sleeping on his back in bassinet. MOB picked infant up during consult and was observed soothing infant and responded appropriately to infant's cues. CSW introduced self and explained reason for consult. MOB presented initially as guarded but was agreeable to consult.   CSW inquired how MOB is feeling emotionally since giving birth. MOB reports she is "fine" and denied recent symptoms of depression and anxiety. MOB shares she recently moved to a larger residence and everyone has their own space. CSW inquired about MOB's mental health history. MOB reports she was diagnosed with depression and anxiety around 26 years old. CSW inquired about MOB's history of suicidal ideation. Per chart review, MOB presented to the ED 01/2016 when her  oldest child was 63 years old endorsing suicidal ideation with a plan to hang herself with car cables due to feeling overwhelmed triggered by caring for her son and believing that she was pregnant. MOB confirmed that she was hospitalized inpatient psych in 2017 for suicidal ideation with a plan. MOB denies endorsing suicidal thoughts since hospitalized in 2017. CSW inquired if MOB experienced postpartum depression after giving birth to any of her other children. MOB shares she has 2 older sons and 1 daughter who is 65 years old. MOB denied a history of postpartum depression. Per chart review, MOB placed a call to nurse triage 11/2021 reporting she felt helpless due to having a 58 month old daughter and suspecting that she was pregnant again. CSW inquired about MOB experiencing postpartum depression after giving birth to her daughter; however, MOB denied, sharing she felt excited after having her daughter due to having her first girl. MOB reports is not not taking psychotropic  medication and does not attend therapy. MOB declined outpatient mental health resources at this time. MOB identified FOB, FOB's mother, and FOB's cousins as supports. MOB denied current SI/HI/DV.  CSW provided education regarding the baby blues period vs. perinatal mood disorders, discussed treatment and provided Behavioral Health Urgent Care and crisis resources on MOB's AVS for mental health follow up if concerns arise. CSW recommends self-evaluation during the postpartum time period using the New Mom Checklist from Postpartum Progress and encouraged MOB to contact a medical professional if symptoms are noted at any time.    MOB reports she has all needed items for infant, including a car seat and crib. MOB declined additional resource needs at this time.  MOB declined review of Sudden Infant Death Syndrome (SIDS) precautions, sharing she is aware of safe sleep precautions.   CSW identifies no further need for intervention and no barriers to discharge at this time.  Signed,  Berniece Salines, MSW, [place], LCASA 08/14/2022 11:27 AM

## 2022-08-22 ENCOUNTER — Telehealth (HOSPITAL_COMMUNITY): Payer: Self-pay | Admitting: *Deleted

## 2022-08-22 NOTE — Telephone Encounter (Signed)
Left phone voicemail message.  Duffy Rhody, RN 08-22-2022 at 10:08am

## 2022-12-08 ENCOUNTER — Other Ambulatory Visit: Payer: Self-pay

## 2022-12-08 ENCOUNTER — Emergency Department
Admission: EM | Admit: 2022-12-08 | Discharge: 2022-12-08 | Discharge status: Against Medical Advice | Payer: Medicaid Other | Attending: Emergency Medicine | Admitting: Emergency Medicine

## 2022-12-08 DIAGNOSIS — K0889 Other specified disorders of teeth and supporting structures: Secondary | ICD-10-CM | POA: Insufficient documentation

## 2022-12-08 DIAGNOSIS — Z5321 Procedure and treatment not carried out due to patient leaving prior to being seen by health care provider: Secondary | ICD-10-CM | POA: Diagnosis not present

## 2022-12-08 NOTE — ED Triage Notes (Signed)
Pt here with left lower dental pain x3 days.

## 2023-07-29 ENCOUNTER — Encounter: Payer: Self-pay | Admitting: *Deleted

## 2023-07-29 ENCOUNTER — Ambulatory Visit
Admission: EM | Admit: 2023-07-29 | Discharge: 2023-07-29 | Disposition: A | Discharge status: Home / Self Care | Payer: Medicaid Other | Attending: Emergency Medicine | Admitting: Emergency Medicine

## 2023-07-29 DIAGNOSIS — M25532 Pain in left wrist: Secondary | ICD-10-CM

## 2023-07-29 MED ORDER — IBUPROFEN 800 MG PO TABS: 800.0000 mg | ORAL_TABLET | Freq: Three times a day (TID) | ORAL | 0 refills | Status: DC

## 2023-07-29 NOTE — ED Provider Notes (Signed)
Renaldo Fiddler    CSN: [id number] Arrival date & time: 07/29/23  0932      History   Chief Complaint No chief complaint on file.   HPI Pamela Jefferson is a 27 y.o. female.   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  Now experiencing pain with all range of motion.  Has attempted to ice which showed no improvement.  Past Medical History:  Diagnosis Date   Anemia    Anxiety    Chlamydia    Depression    "about ready to give up:", no help, just tired   Gonorrhea    Hx of gastric ulcer     Patient Active Problem List   Diagnosis Date Noted   Acute blood loss anemia 08/14/2022   SVD (spontaneous vaginal delivery) 08/13/2022   Normal postpartum course 08/13/2022   Encounter for elective induction of labor 09/02/2021   Depression     Past Surgical History:  Procedure Laterality Date   FRACTURE SURGERY     left arm    OB History     Gravida  5   Para  4   Term  4   Preterm      AB  1   Living  4      SAB  1   IAB      Ectopic      Multiple  0   Live Births  4            Home Medications    Prior to Admission medications   Medication Sig Start Date End Date Taking? Authorizing Provider  ibuprofen (ADVIL) 800 MG tablet Take 1 tablet (800 mg total) by mouth 3 (three) times daily. 07/29/23  Yes Denina Rieger R, NP  iron polysaccharides (NIFEREX) 150 MG capsule Take 1 capsule (150 mg total) by mouth daily. 08/14/22   Dale [place], FNP  Prenatal Vit-Fe Fumarate-FA (PRENATAL MULTIVITAMIN) TABS tablet Take 2 tablets by mouth daily.    [provider]    Family History Family History  Problem Relation Age of Onset   Neurologic Disorder Mother    Hip fracture Maternal Grandmother    Diabetes Maternal Grandfather    Cancer Maternal Grandfather    Asthma Neg Hx    Heart disease Neg Hx    Hypertension Neg Hx    Stroke Neg Hx      Social History Social History   Tobacco Use   Smoking status: Former    Current packs/day: 0.00    Average packs/day: 0.3 packs/day for 4.0 years (1.0 ttl pk-yrs)    Types: Cigarettes    Start date: 04/07/2018    Quit date: 04/07/2022    Years since quitting: 1.3   Smokeless tobacco: Never  Vaping Use   Vaping status: Never Used  Substance Use Topics   Alcohol use: No   Drug use: No     Allergies   Iodine and Shellfish allergy   Review of Systems Review of Systems   Physical Exam Triage Vital Signs ED Triage Vitals  Encounter Vitals Group     BP 07/29/23 0948 127/85     Systolic BP Percentile --      Diastolic BP Percentile --      Pulse Rate 07/29/23 0948 72     Resp 07/29/23 0948 18     Temp 07/29/23 0948 98.7 F (37.1 C)  Temp Source 07/29/23 0948 Oral     SpO2 07/29/23 0948 99 %     Weight --      Height --      Head Circumference --      Peak Flow --      Pain Score 07/29/23 0955 7     Pain Loc --      Pain Education --      Exclude from Growth Chart --    No data found.  Updated Vital Signs BP 127/85 (BP Location: Right Arm)   Pulse 72   Temp 98.7 F (37.1 C) (Oral)   Resp 18   LMP 07/23/2023 (Exact Date)   SpO2 99%   Breastfeeding No   Visual Acuity Right Eye Distance:   Left Eye Distance:   Bilateral Distance:    Right Eye Near:   Left Eye Near:    Bilateral Near:     Physical Exam Constitutional:      Appearance: Normal appearance.  Eyes:     Extraocular Movements: Extraocular movements intact.  Pulmonary:     Effort: Pulmonary effort is normal.  Musculoskeletal:     Comments: General this is noted to the posterior of the left forearm starting midway extending into the dorsum aspect of the left hand without point tenderness, tenderness is felt through all 5 digits without point tenderness, no ecchymosis swelling or deformity present, minimal effort given to complete range of motion of the wrist or hand due to pain elicited  with movement, 2+ radial pulse  Neurological:     Mental Status: She is alert.      UC Treatments / Results  Labs (all labs ordered are listed, but only abnormal results are displayed) Labs Reviewed - No data to display  EKG   Radiology No results found.  Procedures Procedures (including critical care time)  Medications Ordered in UC Medications - No data to display  Initial Impression / Assessment and Plan / UC Course  I have reviewed the triage vital signs and the nursing notes.  Pertinent labs & imaging results that were available during my care of the patient were reviewed by me and considered in my medical decision making (see chart for details).  Acute pain of left wrist  Etiology most likely muscular with irritation to the tendon and ligament with nerve compression, discussed with patient low suspicion for bone involvement as there is no direct injury therefore imaging deferred, prescribed ibuprofen 800 mg and recommended consistent use, wrist brace applied to be used as needed for stability and support, recommended ice and heat, elevation with activity as tolerated and advised follow-up with urgent care or orthopedics, walking referral given if symptoms persist or worsen, work note given   Final Clinical Impressions(s) / UC Diagnoses   Final diagnoses:  Acute pain of left wrist     Discharge Instructions      Your evaluated for your wrist pain which is most likely irritation to the tendons and ligaments allow for free movement and nerve compression which is causing the tingling to your fingers, this ideally will improve with time  Begin ibuprofen 800 mg every 8 hours, most effective when used consistently, may take Tylenol as needed for severe pain  You have been given a wrist brace here today in the clinic to add stability and support, may wear whenever completing activity, may remove at rest  You may use ice or heat over the affected area in 10 to 15-minute  intervals  Whenever sitting and lying can progress onto a pillow for comfort and support  If your symptoms continue to persist or worsen please follow-up with urgent care or orthopedic specialist whose information is listed on front page for reevaluation   ED Prescriptions     Medication Sig Dispense Auth. Provider   ibuprofen (ADVIL) 800 MG tablet Take 1 tablet (800 mg total) by mouth 3 (three) times daily. 21 tablet Ltanya Bayley, Elita Boone, NP      PDMP not reviewed this encounter.   Valinda Hoar, NP 07/29/23 1014

## 2023-07-29 NOTE — ED Triage Notes (Signed)
Pt states she was lifting a pt at work yesterday, and experienced some slight left wrist pain, and was told to go home from work. Today was having difficulty driving due to pain when trying to use steering wheel. LUE "fingers are tingling, and it just feels weird". All LUE digits warm with prompt cap refill. Applied ice last night.

## 2023-07-29 NOTE — Discharge Instructions (Signed)
Your evaluated for your wrist pain which is most likely irritation to the tendons and ligaments allow for free movement and nerve compression which is causing the tingling to your fingers, this ideally will improve with time  Begin ibuprofen 800 mg every 8 hours, most effective when used consistently, may take Tylenol as needed for severe pain  You have been given a wrist brace here today in the clinic to add stability and support, may wear whenever completing activity, may remove at rest  You may use ice or heat over the affected area in 10 to 15-minute intervals  Whenever sitting and lying can progress onto a pillow for comfort and support  If your symptoms continue to persist or worsen please follow-up with urgent care or orthopedic specialist whose information is listed on front page for reevaluation

## 2023-09-01 ENCOUNTER — Ambulatory Visit
Admission: EM | Admit: 2023-09-01 | Discharge: 2023-09-01 | Disposition: A | Discharge status: Home / Self Care | Payer: Medicaid Other | Attending: Emergency Medicine | Admitting: Emergency Medicine

## 2023-09-01 DIAGNOSIS — J0100 Acute maxillary sinusitis, unspecified: Secondary | ICD-10-CM

## 2023-09-01 LAB — POCT RAPID STREP A (OFFICE): Rapid Strep A Screen: NEGATIVE

## 2023-09-01 MED ORDER — AMOXICILLIN 875 MG PO TABS: 875.0000 mg | ORAL_TABLET | Freq: Two times a day (BID) | ORAL | 0 refills | Status: AC

## 2023-09-01 NOTE — ED Triage Notes (Signed)
Patient to Urgent Care with complaints of sore throat. Denies any known fevers.  Reports symptoms started six days ago. Developed a productive cough.  Taking tylenol- last dose 6am.

## 2023-09-01 NOTE — Discharge Instructions (Addendum)
Take the amoxicillin as directed.  Follow up with your primary care provider if your symptoms are not improving.   ° ° °

## 2023-09-01 NOTE — ED Provider Notes (Signed)
Renaldo Fiddler    CSN: [id number] Arrival date & time: 09/01/23  8413      History   Chief Complaint Chief Complaint  Patient presents with   Sore Throat    HPI Pamela Jefferson is a 27 y.o. female.  Patient presents with 6-day history of ear pain, sore throat, congestion, cough.  Her symptoms are getting worse.  Treating with Tylenol; last taken at 0600.  No fever or shortness of breath.  Her medical history includes anemia, anxiety, depression, history of gastric ulcer.  The history is provided by the patient and medical records.    Past Medical History:  Diagnosis Date   Anemia    Anxiety    Chlamydia    Depression    "about ready to give up:", no help, just tired   Gonorrhea    Hx of gastric ulcer     Patient Active Problem List   Diagnosis Date Noted   Acute blood loss anemia 08/14/2022   SVD (spontaneous vaginal delivery) 08/13/2022   Normal postpartum course 08/13/2022   Encounter for elective induction of labor 09/02/2021   Depression     Past Surgical History:  Procedure Laterality Date   FRACTURE SURGERY     left arm    OB History     Gravida  5   Para  4   Term  4   Preterm      AB  1   Living  4      SAB  1   IAB      Ectopic      Multiple  0   Live Births  4            Home Medications    Prior to Admission medications   Medication Sig Start Date End Date Taking? Authorizing Provider  amoxicillin (AMOXIL) 875 MG tablet Take 1 tablet (875 mg total) by mouth 2 (two) times daily for 7 days. 09/01/23 09/08/23 Yes Mickie Bail, NP  ibuprofen (ADVIL) 800 MG tablet Take 1 tablet (800 mg total) by mouth 3 (three) times daily. 07/29/23   Valinda Hoar, NP  iron polysaccharides (NIFEREX) 150 MG capsule Take 1 capsule (150 mg total) by mouth daily. 08/14/22   Dale [place], FNP  Prenatal Vit-Fe Fumarate-FA (PRENATAL MULTIVITAMIN) TABS tablet Take 2 tablets by mouth daily.    [provider]    Family  History Family History  Problem Relation Age of Onset   Neurologic Disorder Mother    Hip fracture Maternal Grandmother    Diabetes Maternal Grandfather    Cancer Maternal Grandfather    Asthma Neg Hx    Heart disease Neg Hx    Hypertension Neg Hx    Stroke Neg Hx     Social History Social History   Tobacco Use   Smoking status: Former    Current packs/day: 0.00    Average packs/day: 0.3 packs/day for 4.0 years (1.0 ttl pk-yrs)    Types: Cigarettes    Start date: 04/07/2018    Quit date: 04/07/2022    Years since quitting: 1.4   Smokeless tobacco: Never  Vaping Use   Vaping status: Never Used  Substance Use Topics   Alcohol use: No   Drug use: No     Allergies   Iodine and Shellfish allergy   Review of Systems Review of Systems  Constitutional:  Negative for chills and fever.  HENT:  Positive for congestion, ear pain, postnasal drip, rhinorrhea and  sore throat.   Respiratory:  Positive for cough. Negative for shortness of breath.      Physical Exam Triage Vital Signs ED Triage Vitals  Encounter Vitals Group     BP 09/01/23 0833 117/80     Systolic BP Percentile --      Diastolic BP Percentile --      Pulse Rate 09/01/23 0833 80     Resp 09/01/23 0833 18     Temp 09/01/23 0833 98 F (36.7 C)     Temp src --      SpO2 09/01/23 0833 98 %     Weight --      Height --      Head Circumference --      Peak Flow --      Pain Score 09/01/23 0830 9     Pain Loc --      Pain Education --      Exclude from Growth Chart --    No data found.  Updated Vital Signs BP 117/80   Pulse 80   Temp 98 F (36.7 C)   Resp 18   LMP 08/18/2023   SpO2 98%   Visual Acuity Right Eye Distance:   Left Eye Distance:   Bilateral Distance:    Right Eye Near:   Left Eye Near:    Bilateral Near:     Physical Exam Constitutional:      General: She is not in acute distress. HENT:     Right Ear: Tympanic membrane normal.     Left Ear: Tympanic membrane normal.      Nose: Congestion present.     Mouth/Throat:     Mouth: Mucous membranes are moist.     Pharynx: Oropharynx is clear.  Cardiovascular:     Rate and Rhythm: Normal rate and regular rhythm.     Heart sounds: Normal heart sounds.  Pulmonary:     Effort: Pulmonary effort is normal. No respiratory distress.     Breath sounds: Normal breath sounds.  Skin:    General: Skin is warm and dry.  Neurological:     Mental Status: She is alert.      UC Treatments / Results  Labs (all labs ordered are listed, but only abnormal results are displayed) Labs Reviewed  POCT RAPID STREP A (OFFICE)    EKG   Radiology No results found.  Procedures Procedures (including critical care time)  Medications Ordered in UC Medications - No data to display  Initial Impression / Assessment and Plan / UC Course  I have reviewed the triage vital signs and the nursing notes.  Pertinent labs & imaging results that were available during my care of the patient were reviewed by me and considered in my medical decision making (see chart for details).    Acute sinusitis.  Patient has been symptomatic for 6 days.  Treating with amoxicillin.  Tylenol or ibuprofen as needed.  Plain Mucinex as needed.  Instructed patient to follow up with her PCP if her symptoms are not improving.  She agrees to plan of care.   Final Clinical Impressions(s) / UC Diagnoses   Final diagnoses:  Acute non-recurrent maxillary sinusitis     Discharge Instructions      Take the amoxicillin as directed.  Follow-up with your primary care provider if your symptoms are not improving.      ED Prescriptions     Medication Sig Dispense Auth. Provider   amoxicillin (AMOXIL) 875 MG tablet Take 1  tablet (875 mg total) by mouth 2 (two) times daily for 7 days. 14 tablet Mickie Bail, NP      PDMP not reviewed this encounter.   Mickie Bail, NP 09/01/23 [phone]

## 2023-09-06 DIAGNOSIS — S82232A Displaced oblique fracture of shaft of left tibia, initial encounter for closed fracture: Secondary | ICD-10-CM | POA: Diagnosis not present

## 2023-09-06 DIAGNOSIS — Z91013 Allergy to seafood: Secondary | ICD-10-CM | POA: Diagnosis not present

## 2023-09-06 DIAGNOSIS — S82435A Nondisplaced oblique fracture of shaft of left fibula, initial encounter for closed fracture: Secondary | ICD-10-CM | POA: Diagnosis not present

## 2023-09-06 DIAGNOSIS — M79604 Pain in right leg: Secondary | ICD-10-CM | POA: Diagnosis not present

## 2023-09-06 DIAGNOSIS — W19XXXA Unspecified fall, initial encounter: Secondary | ICD-10-CM | POA: Diagnosis not present

## 2023-09-06 DIAGNOSIS — M25471 Effusion, right ankle: Secondary | ICD-10-CM | POA: Diagnosis not present

## 2023-09-06 DIAGNOSIS — S82301A Unspecified fracture of lower end of right tibia, initial encounter for closed fracture: Secondary | ICD-10-CM | POA: Diagnosis not present

## 2023-09-06 DIAGNOSIS — S82434A Nondisplaced oblique fracture of shaft of right fibula, initial encounter for closed fracture: Secondary | ICD-10-CM | POA: Diagnosis not present

## 2023-09-06 DIAGNOSIS — S8991XA Unspecified injury of right lower leg, initial encounter: Secondary | ICD-10-CM | POA: Diagnosis not present

## 2023-09-06 DIAGNOSIS — S82201A Unspecified fracture of shaft of right tibia, initial encounter for closed fracture: Secondary | ICD-10-CM | POA: Diagnosis not present

## 2023-09-06 DIAGNOSIS — M79661 Pain in right lower leg: Secondary | ICD-10-CM | POA: Diagnosis not present

## 2023-09-06 DIAGNOSIS — S82241A Displaced spiral fracture of shaft of right tibia, initial encounter for closed fracture: Secondary | ICD-10-CM | POA: Diagnosis not present

## 2023-09-06 DIAGNOSIS — S82831A Other fracture of upper and lower end of right fibula, initial encounter for closed fracture: Secondary | ICD-10-CM | POA: Diagnosis not present

## 2023-09-06 DIAGNOSIS — S82401A Unspecified fracture of shaft of right fibula, initial encounter for closed fracture: Secondary | ICD-10-CM | POA: Diagnosis not present

## 2023-09-06 DIAGNOSIS — S8291XA Unspecified fracture of right lower leg, initial encounter for closed fracture: Secondary | ICD-10-CM | POA: Diagnosis not present

## 2023-09-06 DIAGNOSIS — M79671 Pain in right foot: Secondary | ICD-10-CM | POA: Diagnosis not present

## 2023-09-06 DIAGNOSIS — S82231A Displaced oblique fracture of shaft of right tibia, initial encounter for closed fracture: Secondary | ICD-10-CM | POA: Diagnosis not present

## 2023-09-07 DIAGNOSIS — S82241A Displaced spiral fracture of shaft of right tibia, initial encounter for closed fracture: Secondary | ICD-10-CM | POA: Diagnosis not present

## 2023-09-08 ENCOUNTER — Encounter (HOSPITAL_COMMUNITY): Payer: Self-pay | Admitting: *Deleted

## 2023-09-08 ENCOUNTER — Emergency Department (HOSPITAL_COMMUNITY): Payer: Medicaid Other

## 2023-09-08 ENCOUNTER — Emergency Department (HOSPITAL_COMMUNITY)
Admission: EM | Admit: 2023-09-08 | Discharge: 2023-09-09 | Disposition: A | Discharge status: Home / Self Care | Payer: Medicaid Other | Source: Home / Self Care | Attending: Emergency Medicine | Admitting: Emergency Medicine

## 2023-09-08 ENCOUNTER — Other Ambulatory Visit: Payer: Self-pay

## 2023-09-08 DIAGNOSIS — S82231A Displaced oblique fracture of shaft of right tibia, initial encounter for closed fracture: Secondary | ICD-10-CM | POA: Insufficient documentation

## 2023-09-08 DIAGNOSIS — S82201A Unspecified fracture of shaft of right tibia, initial encounter for closed fracture: Secondary | ICD-10-CM | POA: Diagnosis not present

## 2023-09-08 DIAGNOSIS — S82831A Other fracture of upper and lower end of right fibula, initial encounter for closed fracture: Secondary | ICD-10-CM | POA: Diagnosis not present

## 2023-09-08 DIAGNOSIS — W19XXXA Unspecified fall, initial encounter: Secondary | ICD-10-CM | POA: Insufficient documentation

## 2023-09-08 DIAGNOSIS — W1839XA Other fall on same level, initial encounter: Secondary | ICD-10-CM | POA: Diagnosis not present

## 2023-09-08 DIAGNOSIS — S82434A Nondisplaced oblique fracture of shaft of right fibula, initial encounter for closed fracture: Secondary | ICD-10-CM | POA: Diagnosis not present

## 2023-09-08 DIAGNOSIS — S82401A Unspecified fracture of shaft of right fibula, initial encounter for closed fracture: Secondary | ICD-10-CM | POA: Diagnosis not present

## 2023-09-08 MED ORDER — OXYCODONE-ACETAMINOPHEN 5-325 MG PO TABS
1.0000 | ORAL_TABLET | Freq: Once | ORAL | Status: AC
  Administered 2023-09-08: 1 via ORAL
  Filled 2023-09-08: qty 1

## 2023-09-08 NOTE — Progress Notes (Signed)
Orthopedic Tech Progress Note Patient Details:  Pamela Jefferson [birth year] [id number]  ED MD called requesting to have cast removal ASAP   Casting Type of Cast: Long leg cast Cast Location: RLE Cast Material: Fiberglass Cast Intervention: Removal  Post Interventions Patient Tolerated: Fair Instructions Provided: Care of device   Donald Pore 09/08/2023, 9:42 PM

## 2023-09-08 NOTE — ED Triage Notes (Signed)
The  pt injured her rt leg yesterday after a fall she was seen at [facility] and she has a long leg cast on her rt leg for a frscture of the tibia and fibula mid-shaft  lmp de5th

## 2023-09-08 NOTE — ED Triage Notes (Signed)
The pt thinks that her cast is too tight

## 2023-09-08 NOTE — ED Provider Triage Note (Signed)
Emergency Medicine Provider Triage Evaluation Note  Pamela Jefferson , a 27 y.o. female  was evaluated in triage.  Pt complains of right leg pain. States that she was seen for a fall at [facility] on 12/18. Found to have a tib/fib fracture. Was reportedly offered surgery versus casting and outpatient follow-up. Chose to have a cast placed. States that she was not given pain medication. About 30-45 minutes prior to arrival, states that she started having diminished sensation in her leg. States that she can no longer feel her toes. Is concerned the cast is on too tight. Also feels like her options weren't appropriately explained to her at [facility] and would like to have a second opinion  Review of Systems  Positive:  Negative:   Physical Exam  BP (!) 145/105   Pulse (!) 109   Temp 99.1 F (37.3 C)   Resp 18   Ht 5\' 8"  (1.727 m)   Wt 100.7 kg   LMP 08/25/2023   SpO2 100%   BMI 33.76 kg/m  Gen:   Awake, no distress   Resp:  Normal effort  MSK:   Moves extremities without difficulty  Other:  Long leg above the knee cast placed on the right leg in full 180 degree extension. Capillary refill is sluggish, toes are cold. No sensation to sharp touch.  Medical Decision Making  Medically screening exam initiated at 9:00 PM.  Appropriate orders placed.  Pamela Jefferson was informed that the remainder of the evaluation will be completed by another provider, this initial triage assessment does not replace that evaluation, and the importance of remaining in the ED until their evaluation is complete.  Called ortho tech to have patients cast removed given concern for vascular compromise due to swelling.   Patients cast removed by ortho tech with immediate improvement in symptoms. Patient now with full sensation of her leg and foot and good capillary refill. Strong DP and PT pulses palpable with doppler. Compartments soft  Unable to see patients x-ray images from [facility], can see read that says   1. Nondisplaced  oblique distal fibular shaft fracture. 2. Partially imaged mild laterally displaced distal tibial shaft fracture.   Will repeat x-rays, work-up initiated   Vear Clock 09/08/23 2141

## 2023-09-09 ENCOUNTER — Telehealth: Payer: Self-pay | Admitting: Orthopedic Surgery

## 2023-09-09 ENCOUNTER — Observation Stay (HOSPITAL_COMMUNITY)
Admission: EM | Admit: 2023-09-09 | Discharge: 2023-09-10 | Disposition: A | Discharge status: Home / Self Care | Payer: Medicaid Other | Attending: Emergency Medicine | Admitting: Emergency Medicine

## 2023-09-09 ENCOUNTER — Encounter (HOSPITAL_COMMUNITY): Payer: Self-pay | Admitting: Orthopedic Surgery

## 2023-09-09 ENCOUNTER — Inpatient Hospital Stay (HOSPITAL_COMMUNITY): Payer: Medicaid Other

## 2023-09-09 ENCOUNTER — Inpatient Hospital Stay (HOSPITAL_COMMUNITY): Payer: Medicaid Other | Admitting: Certified Registered Nurse Anesthetist

## 2023-09-09 ENCOUNTER — Inpatient Hospital Stay (HOSPITAL_BASED_OUTPATIENT_CLINIC_OR_DEPARTMENT_OTHER): Payer: Medicaid Other | Admitting: Certified Registered Nurse Anesthetist

## 2023-09-09 ENCOUNTER — Encounter (HOSPITAL_COMMUNITY): Admission: EM | Disposition: A | Discharge status: Home / Self Care | Payer: Self-pay | Source: Home / Self Care

## 2023-09-09 DIAGNOSIS — S82201A Unspecified fracture of shaft of right tibia, initial encounter for closed fracture: Secondary | ICD-10-CM | POA: Diagnosis not present

## 2023-09-09 DIAGNOSIS — S82202A Unspecified fracture of shaft of left tibia, initial encounter for closed fracture: Principal | ICD-10-CM | POA: Diagnosis present

## 2023-09-09 DIAGNOSIS — W1839XA Other fall on same level, initial encounter: Secondary | ICD-10-CM | POA: Insufficient documentation

## 2023-09-09 DIAGNOSIS — S82301A Unspecified fracture of lower end of right tibia, initial encounter for closed fracture: Secondary | ICD-10-CM | POA: Diagnosis not present

## 2023-09-09 DIAGNOSIS — S82401A Unspecified fracture of shaft of right fibula, initial encounter for closed fracture: Secondary | ICD-10-CM | POA: Diagnosis not present

## 2023-09-09 DIAGNOSIS — S82402A Unspecified fracture of shaft of left fibula, initial encounter for closed fracture: Secondary | ICD-10-CM | POA: Diagnosis present

## 2023-09-09 HISTORY — PX: TIBIA IM NAIL INSERTION: SHX2516

## 2023-09-09 LAB — CBC WITH DIFFERENTIAL/PLATELET
Abs Immature Granulocytes: 0.05 10*3/uL (ref 0.00–0.07)
Basophils Absolute: 0.1 10*3/uL (ref 0.0–0.1)
Basophils Relative: 0 %
Eosinophils Absolute: 0.5 10*3/uL (ref 0.0–0.5)
Eosinophils Relative: 4 %
HCT: 37.1 % (ref 36.0–46.0)
Hemoglobin: 11.4 g/dL — ABNORMAL LOW (ref 12.0–15.0)
Immature Granulocytes: 0 %
Lymphocytes Relative: 18 %
Lymphs Abs: 2.1 10*3/uL (ref 0.7–4.0)
MCH: 28.9 pg (ref 26.0–34.0)
MCHC: 30.7 g/dL (ref 30.0–36.0)
MCV: 93.9 fL (ref 80.0–100.0)
Monocytes Absolute: 0.6 10*3/uL (ref 0.1–1.0)
Monocytes Relative: 5 %
Neutro Abs: 8.5 10*3/uL — ABNORMAL HIGH (ref 1.7–7.7)
Neutrophils Relative %: 73 %
Platelets: 468 10*3/uL — ABNORMAL HIGH (ref 150–400)
RBC: 3.95 MIL/uL (ref 3.87–5.11)
RDW: 14.5 % (ref 11.5–15.5)
WBC: 11.8 10*3/uL — ABNORMAL HIGH (ref 4.0–10.5)
nRBC: 0 % (ref 0.0–0.2)

## 2023-09-09 LAB — HIV ANTIBODY (ROUTINE TESTING W REFLEX): HIV Screen 4th Generation wRfx: NONREACTIVE

## 2023-09-09 LAB — BASIC METABOLIC PANEL
Anion gap: 10 (ref 5–15)
BUN: 7 mg/dL (ref 6–20)
CO2: 22 mmol/L (ref 22–32)
Calcium: 9.3 mg/dL (ref 8.9–10.3)
Chloride: 104 mmol/L (ref 98–111)
Creatinine, Ser: 0.53 mg/dL (ref 0.44–1.00)
GFR, Estimated: >60 mL/min (ref >60)
Glucose, Bld: 87 mg/dL (ref 70–99)
Potassium: 3.5 mmol/L (ref 3.5–5.1)
Sodium: 136 mmol/L (ref 135–145)

## 2023-09-09 LAB — HCG, SERUM, QUALITATIVE: Preg, Serum: NEGATIVE

## 2023-09-09 LAB — TYPE AND SCREEN
ABO/RH(D): B POS
Antibody Screen: NEGATIVE

## 2023-09-09 SURGERY — INSERTION, INTRAMEDULLARY ROD, TIBIA
Anesthesia: General | Site: Leg Lower | Laterality: Right

## 2023-09-09 MED ORDER — ACETAMINOPHEN 500 MG PO TABS
1000.0000 mg | ORAL_TABLET | Freq: Three times a day (TID) | ORAL | Status: DC
  Administered 2023-09-10 (×2): 1000 mg via ORAL
  Filled 2023-09-09 (×2): qty 2

## 2023-09-09 MED ORDER — ONDANSETRON HCL 4 MG/2ML IJ SOLN
INTRAMUSCULAR | Status: DC | PRN
  Administered 2023-09-09: 4 mg via INTRAVENOUS

## 2023-09-09 MED ORDER — MIDAZOLAM HCL 2 MG/2ML IJ SOLN
INTRAMUSCULAR | Status: AC
  Filled 2023-09-09: qty 2

## 2023-09-09 MED ORDER — PROPOFOL 10 MG/ML IV BOLUS
INTRAVENOUS | Status: AC
  Filled 2023-09-09: qty 20

## 2023-09-09 MED ORDER — OXYCODONE HCL 5 MG PO TABS: 5.0000 mg | ORAL_TABLET | Freq: Once | ORAL | Status: DC | PRN

## 2023-09-09 MED ORDER — OXYCODONE HCL 5 MG/5ML PO SOLN: 5.0000 mg | Freq: Once | ORAL | Status: DC | PRN

## 2023-09-09 MED ORDER — FENTANYL CITRATE (PF) 100 MCG/2ML IJ SOLN
25.0000 ug | INTRAMUSCULAR | Status: DC | PRN
  Administered 2023-09-09: 50 ug via INTRAVENOUS

## 2023-09-09 MED ORDER — ACETAMINOPHEN 10 MG/ML IV SOLN
INTRAVENOUS | Status: DC | PRN
  Administered 2023-09-09: 1000 mg via INTRAVENOUS

## 2023-09-09 MED ORDER — MIDAZOLAM HCL 2 MG/2ML IJ SOLN
INTRAMUSCULAR | Status: DC | PRN
  Administered 2023-09-09: 2 mg via INTRAVENOUS

## 2023-09-09 MED ORDER — ROCURONIUM BROMIDE 10 MG/ML (PF) SYRINGE
PREFILLED_SYRINGE | INTRAVENOUS | Status: DC | PRN
  Administered 2023-09-09: 20 mg via INTRAVENOUS
  Administered 2023-09-09: 60 mg via INTRAVENOUS

## 2023-09-09 MED ORDER — CEFAZOLIN SODIUM-DEXTROSE 2-4 GM/100ML-% IV SOLN
2.0000 g | Freq: Four times a day (QID) | INTRAVENOUS | Status: DC
  Administered 2023-09-09 – 2023-09-10 (×2): 2 g via INTRAVENOUS
  Filled 2023-09-09 (×2): qty 100

## 2023-09-09 MED ORDER — TRANEXAMIC ACID-NACL 1000-0.7 MG/100ML-% IV SOLN
1000.0000 mg | INTRAVENOUS | Status: AC
  Administered 2023-09-09: 1000 mg via INTRAVENOUS

## 2023-09-09 MED ORDER — LIDOCAINE HCL (PF) 2 % IJ SOLN
INTRAMUSCULAR | Status: DC | PRN
  Administered 2023-09-09: 60 mg via INTRADERMAL

## 2023-09-09 MED ORDER — KETOROLAC TROMETHAMINE 15 MG/ML IJ SOLN
15.0000 mg | Freq: Three times a day (TID) | INTRAMUSCULAR | Status: DC
  Administered 2023-09-09 – 2023-09-10 (×2): 15 mg via INTRAVENOUS
  Filled 2023-09-09 (×2): qty 1

## 2023-09-09 MED ORDER — OXYCODONE HCL 5 MG PO TABS: 2.5000 mg | ORAL_TABLET | Freq: Four times a day (QID) | ORAL | 0 refills | Status: DC | PRN

## 2023-09-09 MED ORDER — CEFAZOLIN SODIUM-DEXTROSE 2-4 GM/100ML-% IV SOLN
2.0000 g | INTRAVENOUS | Status: AC
  Administered 2023-09-09: 2 g via INTRAVENOUS

## 2023-09-09 MED ORDER — OXYCODONE HCL 5 MG PO TABS
5.0000 mg | ORAL_TABLET | ORAL | Status: DC | PRN
  Administered 2023-09-09 – 2023-09-10 (×4): 10 mg via ORAL
  Filled 2023-09-09 (×4): qty 2

## 2023-09-09 MED ORDER — FENTANYL CITRATE (PF) 250 MCG/5ML IJ SOLN
INTRAMUSCULAR | Status: DC | PRN
  Administered 2023-09-09 (×3): 25 ug via INTRAVENOUS
  Administered 2023-09-09: 100 ug via INTRAVENOUS
  Administered 2023-09-09: 25 ug via INTRAVENOUS
  Administered 2023-09-09: 50 ug via INTRAVENOUS

## 2023-09-09 MED ORDER — METHOCARBAMOL 500 MG PO TABS
500.0000 mg | ORAL_TABLET | Freq: Four times a day (QID) | ORAL | Status: DC
  Administered 2023-09-09 – 2023-09-10 (×3): 500 mg via ORAL
  Filled 2023-09-09 (×3): qty 1

## 2023-09-09 MED ORDER — TRANEXAMIC ACID-NACL 1000-0.7 MG/100ML-% IV SOLN: 1000.0000 mg | Freq: Once | INTRAVENOUS | Status: DC

## 2023-09-09 MED ORDER — FENTANYL CITRATE (PF) 250 MCG/5ML IJ SOLN
INTRAMUSCULAR | Status: AC
  Filled 2023-09-09: qty 5

## 2023-09-09 MED ORDER — SENNA 8.6 MG PO TABS
1.0000 | ORAL_TABLET | Freq: Two times a day (BID) | ORAL | Status: DC
  Administered 2023-09-09: 8.6 mg via ORAL
  Filled 2023-09-09 (×2): qty 1

## 2023-09-09 MED ORDER — LACTATED RINGERS IV SOLN: INTRAVENOUS | Status: DC | PRN

## 2023-09-09 MED ORDER — DEXAMETHASONE SODIUM PHOSPHATE 10 MG/ML IJ SOLN
INTRAMUSCULAR | Status: DC | PRN
  Administered 2023-09-09: 10 mg via INTRAVENOUS

## 2023-09-09 MED ORDER — TRANEXAMIC ACID-NACL 1000-0.7 MG/100ML-% IV SOLN
INTRAVENOUS | Status: AC
  Filled 2023-09-09: qty 100

## 2023-09-09 MED ORDER — FENTANYL CITRATE (PF) 100 MCG/2ML IJ SOLN
INTRAMUSCULAR | Status: AC
  Filled 2023-09-09: qty 2

## 2023-09-09 MED ORDER — PROPOFOL 10 MG/ML IV BOLUS
INTRAVENOUS | Status: DC | PRN
  Administered 2023-09-09: 250 mg via INTRAVENOUS

## 2023-09-09 MED ORDER — PHENYLEPHRINE HCL-NACL 20-0.9 MG/250ML-% IV SOLN
INTRAVENOUS | Status: DC | PRN
  Administered 2023-09-09 (×3): 80 ug via INTRAVENOUS
  Administered 2023-09-09: 160 ug via INTRAVENOUS

## 2023-09-09 MED ORDER — SUGAMMADEX SODIUM 200 MG/2ML IV SOLN
INTRAVENOUS | Status: DC | PRN
  Administered 2023-09-09: 200 mg via INTRAVENOUS

## 2023-09-09 MED ORDER — POLYETHYLENE GLYCOL 3350 17 G PO PACK
17.0000 g | PACK | Freq: Every day | ORAL | Status: DC
  Filled 2023-09-09: qty 1

## 2023-09-09 MED ORDER — ONDANSETRON HCL 4 MG/2ML IJ SOLN: 4.0000 mg | Freq: Four times a day (QID) | INTRAMUSCULAR | Status: DC | PRN

## 2023-09-09 SURGICAL SUPPLY — 60 items
BAG COUNTER SPONGE SURGICOUNT (BAG) ×1 IMPLANT
BIT DRILL LONG 4.0 (BIT) IMPLANT
BIT DRILL SHORT 4.0 (BIT) IMPLANT
BLADE SURG 10 STRL SS (BLADE) ×2 IMPLANT
BNDG COHESIVE 4X5 TAN STRL (GAUZE/BANDAGES/DRESSINGS) ×1 IMPLANT
BNDG ELASTIC 4X5.8 VLCR STR LF (GAUZE/BANDAGES/DRESSINGS) ×1 IMPLANT
BNDG ELASTIC 6X5.8 VLCR STR LF (GAUZE/BANDAGES/DRESSINGS) ×1 IMPLANT
BNDG GAUZE DERMACEA FLUFF 4 (GAUZE/BANDAGES/DRESSINGS) ×1 IMPLANT
BRUSH SCRUB EZ PLAIN DRY (MISCELLANEOUS) ×2 IMPLANT
CHLORAPREP W/TINT 26 (MISCELLANEOUS) ×1 IMPLANT
COVER SURGICAL LIGHT HANDLE (MISCELLANEOUS) ×2 IMPLANT
DRAPE C-ARM 42X72 X-RAY (DRAPES) ×1 IMPLANT
DRAPE C-ARMOR (DRAPES) ×1 IMPLANT
DRAPE HALF SHEET 40X57 (DRAPES) ×2 IMPLANT
DRAPE IMP U-DRAPE 54X76 (DRAPES) ×2 IMPLANT
DRAPE INCISE IOBAN 66X45 STRL (DRAPES) IMPLANT
DRAPE SHEET LG 3/4 BI-LAMINATE (DRAPES) IMPLANT
DRAPE SURG ORHT 6 SPLT 77X108 (DRAPES) ×2 IMPLANT
DRAPE U-SHAPE 47X51 STRL (DRAPES) ×1 IMPLANT
DRILL BIT LONG 4.0 (BIT) ×1
DRSG ADAPTIC 3X8 NADH LF (GAUZE/BANDAGES/DRESSINGS) ×1 IMPLANT
DRSG AQUACEL AG ADV 3.5X10 (GAUZE/BANDAGES/DRESSINGS) IMPLANT
DRSG TEGADERM 4X4.75 (GAUZE/BANDAGES/DRESSINGS) IMPLANT
ELECT REM PT RETURN 9FT ADLT (ELECTROSURGICAL) ×1
ELECTRODE REM PT RTRN 9FT ADLT (ELECTROSURGICAL) ×1 IMPLANT
GAUZE SPONGE 4X4 12PLY STRL (GAUZE/BANDAGES/DRESSINGS) ×1 IMPLANT
GAUZE XEROFORM 5X9 LF (GAUZE/BANDAGES/DRESSINGS) IMPLANT
GLOVE BIO SURGEON STRL SZ 6.5 (GLOVE) ×3 IMPLANT
GLOVE BIO SURGEON STRL SZ7.5 (GLOVE) ×4 IMPLANT
GLOVE BIOGEL PI IND STRL 6.5 (GLOVE) ×1 IMPLANT
GLOVE BIOGEL PI IND STRL 7.5 (GLOVE) ×1 IMPLANT
GOWN STRL REUS W/ TWL LRG LVL3 (GOWN DISPOSABLE) ×2 IMPLANT
GOWN STRL SURGICAL XL XLNG (GOWN DISPOSABLE) IMPLANT
GUIDE PIN 3.2X343 (PIN) ×2
GUIDE ROD 3.0 (MISCELLANEOUS) ×1
KIT BASIN OR (CUSTOM PROCEDURE TRAY) ×1 IMPLANT
KIT TURNOVER KIT B (KITS) ×1 IMPLANT
NAIL TIBIAL 10X38 (Nail) IMPLANT
PACK TOTAL JOINT (CUSTOM PROCEDURE TRAY) ×1 IMPLANT
PAD ARMBOARD 7.5X6 YLW CONV (MISCELLANEOUS) ×2 IMPLANT
PIN GUIDE 3.2X343MM (PIN) IMPLANT
ROD GUIDE 3.0 (MISCELLANEOUS) IMPLANT
SCREW TRIGEN LOW PROF 5.0X30 (Screw) IMPLANT
SCREW TRIGEN LOW PROF 5.0X32.5 (Screw) IMPLANT
SCREW TRIGEN LOW PROF 5.0X37.5 (Screw) IMPLANT
SCREW TRIGEN LOW PROF 5.0X42.5 (Screw) IMPLANT
SCREW TRIGEN LOW PROF 5.0X55 (Screw) IMPLANT
SCREW TRIGEN LOW PROF 5.0X60 (Screw) IMPLANT
STAPLER SKIN PROX WIDE 3.9 (STAPLE) IMPLANT
STAPLER VISISTAT 35W (STAPLE) ×1 IMPLANT
SUCTION TUBE FRAZIER 10FR DISP (SUCTIONS) IMPLANT
SUT MNCRL AB 3-0 PS2 18 (SUTURE) ×1 IMPLANT
SUT VIC AB 0 CT1 18XCR BRD 8 (SUTURE) IMPLANT
SUT VIC AB 0 CT1 27XBRD ANBCTR (SUTURE) IMPLANT
SUT VIC AB 2-0 CT1 18 (SUTURE) IMPLANT
SUT VIC AB 2-0 CT1 TAPERPNT 27 (SUTURE) IMPLANT
SUT VIC AB 2-0 CT2 18 VCP726D (SUTURE) IMPLANT
TOWEL GREEN STERILE (TOWEL DISPOSABLE) ×2 IMPLANT
TOWEL GREEN STERILE FF (TOWEL DISPOSABLE) ×1 IMPLANT
YANKAUER SUCT BULB TIP NO VENT (SUCTIONS) IMPLANT

## 2023-09-09 NOTE — Transfer of Care (Signed)
Immediate Anesthesia Transfer of Care Note  Patient: Pamela Jefferson  Procedure(s) Performed: INTRAMEDULLARY (IM) NAIL TIBIAL (Right: Leg Lower)  Patient Location: PACU  Anesthesia Type:General  Level of Consciousness: drowsy and patient cooperative  Airway & Oxygen Therapy: Patient Spontanous Breathing and Patient connected to face mask oxygen  Post-op Assessment: Report given to RN and Post -op Vital signs reviewed and stable  Post vital signs: Reviewed and stable  Last Vitals:  Vitals Value Taken Time  BP 148/97 09/09/23 2218  Temp    Pulse 85 09/09/23 2226  Resp 21 09/09/23 2226  SpO2 97 % 09/09/23 2226  Vitals shown include unfiled device data.  Last Pain:  Vitals:   09/09/23 1725  TempSrc:   PainSc: 9          Complications: No notable events documented.

## 2023-09-09 NOTE — Discharge Instructions (Signed)
For pain control you may take 1000 mg of Tylenol every 8 hours scheduled.  In addition you can take 0.5 to 1 tablet of Oxycodone every 6 hours as needed for pain not controlled with the scheduled Tylenol.  Please do NOT bear weight on the right leg as this can cause additional damage to surrounding structures.

## 2023-09-09 NOTE — ED Provider Notes (Incomplete)
[place] EMERGENCY DEPARTMENT AT [facility] Provider Note  CSN: [id number] Arrival date & time: 09/08/23 1937  Chief Complaint(s) Leg Pain  HPI Pamela Jefferson is a 27 y.o. female seen at [facility] for right tib-fib fracture 3 days ago that was casted.  Patient presented here due to increased pain and decreased sensation of her distal foot. No additional trauma.  Patient was seen in the MSE process and their was concern for cast compression syndrome. Cast was removed. Patient reports that pain and sensation have improved.  The history is provided by the patient.    Past Medical History Past Medical History:  Diagnosis Date  . Anemia   . Anxiety   . Chlamydia   . Depression    "about ready to give up:", no help, just tired  . Gonorrhea   . Hx of gastric ulcer    Patient Active Problem List   Diagnosis Date Noted  . Acute blood loss anemia 08/14/2022  . SVD (spontaneous vaginal delivery) 08/13/2022  . Normal postpartum course 08/13/2022  . Encounter for elective induction of labor 09/02/2021  . Depression    Home Medication(s) Prior to Admission medications   Medication Sig Start Date End Date Taking? Authorizing Provider  oxyCODONE (ROXICODONE) 5 MG immediate release tablet Take 0.5-1 tablets (2.5-5 mg total) by mouth every 6 (six) hours as needed for up to 5 days for severe pain (pain score 7-10). 09/09/23 09/14/23 Yes Letetia Romanello, Amadeo Garnet, MD  ibuprofen (ADVIL) 800 MG tablet Take 1 tablet (800 mg total) by mouth 3 (three) times daily. 07/29/23   Valinda Hoar, NP  iron polysaccharides (NIFEREX) 150 MG capsule Take 1 capsule (150 mg total) by mouth daily. 08/14/22   Dale Riverside, FNP  Prenatal Vit-Fe Fumarate-FA (PRENATAL MULTIVITAMIN) TABS tablet Take 2 tablets by mouth daily.    [provider]                                                                                                                                    Allergies Iodine and  Shellfish allergy  Review of Systems Review of Systems As noted in HPI  Physical Exam Vital Signs  I have reviewed the triage vital signs BP 137/78 (BP Location: Right Wrist)   Pulse 99   Temp (!) 96.1 F (35.6 C) (Temporal)   Resp 16   Ht 5\' 8"  (1.727 m)   Wt 100.7 kg   LMP 08/25/2023   SpO2 98%   BMI 33.76 kg/m   Physical Exam Vitals reviewed.  Constitutional:      General: She is not in acute distress.    Appearance: She is well-developed. She is not diaphoretic.  HENT:     Head: Normocephalic and atraumatic.     Right Ear: External ear normal.     Left Ear: External ear normal.     Nose: Nose normal.  Eyes:  General: No scleral icterus.    Conjunctiva/sclera: Conjunctivae normal.  Neck:     Trachea: Phonation normal.  Cardiovascular:     Rate and Rhythm: Normal rate and regular rhythm.  Pulmonary:     Effort: Pulmonary effort is normal. No respiratory distress.     Breath sounds: No stridor.  Abdominal:     General: There is no distension.  Musculoskeletal:        General: Normal range of motion.     Cervical back: Normal range of motion.     Right lower leg: Swelling and tenderness present.     Right foot: Decreased capillary refill. Normal pulse.     Left foot: Decreased capillary refill. Normal pulse.     Comments: Sensation equal bilaterally  Neurological:     Mental Status: She is alert and oriented to person, place, and time.  Psychiatric:        Behavior: Behavior normal.     ED Results and Treatments Labs (all labs ordered are listed, but only abnormal results are displayed) Labs Reviewed - No data to display                                                                                                                       EKG  EKG Interpretation Date/Time:    Ventricular Rate:    PR Interval:    QRS Duration:    QT Interval:    QTC Calculation:   R Axis:      Text Interpretation:         Radiology DG Ankle Complete  Right Result Date: 09/08/2023 CLINICAL DATA:  Fall, pain EXAM: RIGHT ANKLE - COMPLETE 3+ VIEW COMPARISON:  Tibia/fibula series today FINDINGS: Oblique fracture through the distal right tibial shaft with mild displacement. Nondisplaced distal fibular metaphyseal fracture. No subluxation or dislocation. IMPRESSION: Distal tibial shaft fracture. Nondisplaced distal fibular metaphyseal fracture. Electronically Signed   By: Charlett Nose M.D.   On: 09/08/2023 22:50   DG Tibia/Fibula Right Result Date: 09/08/2023 CLINICAL DATA:  Fall, pain EXAM: RIGHT TIBIA AND FIBULA - 2 VIEW COMPARISON:  Ankle series today FINDINGS: Oblique fracture through the distal right tibial shaft. Mild displacement. No subluxation or dislocation. Nondisplaced oblique fracture through the distal fibular metaphysis. IMPRESSION: Oblique distal tibial shaft fracture. Nondisplaced distal fibular metaphyseal fracture. Electronically Signed   By: Charlett Nose M.D.   On: 09/08/2023 22:50    Medications Ordered in ED Medications  oxyCODONE-acetaminophen (PERCOCET/ROXICET) 5-325 MG per tablet 1 tablet (1 tablet Oral Given 09/08/23 2101)   Procedures Procedures  (including critical care time) Medical Decision Making / ED Course   Medical Decision Making Amount and/or Complexity of Data Reviewed Radiology: ordered and independent interpretation performed. Decision-making details documented in ED Course.  Risk Prescription drug management.    Presentation was concerning for cast compression syndrome. Xrays confirmed fractures noted previously. No acute injuries.  Compartments are soft and patient's pain and sensation has improved after cast was removed.  Will need to splint.  Patient already has crutches.  The patient does not want to follow-up at [facility] and would like to continue care within the [facility] system.  Will provide her contact information for on-call orthopedist.    Final Clinical Impression(s) / ED Diagnoses Final  diagnoses:  None   The patient appears reasonably screened and/or stabilized for discharge and I doubt any other medical condition or other [facility] requiring further screening, evaluation, or treatment in the ED at this time. I have discussed the findings, Dx and Tx plan with the patient/family who expressed understanding and agree(s) with the plan. Discharge instructions discussed at length. The patient/family was given strict return precautions who verbalized understanding of the instructions. No further questions at time of discharge.  Disposition: Discharge  Condition: Good  ED Discharge Orders          Ordered    oxyCODONE (ROXICODONE) 5 MG immediate release tablet  Every 6 hours PRN        09/09/23 0004            [facility] narcotic database reviewed and no active prescriptions noted.   Follow Up: No follow-up provider specified.   This chart was dictated using voice recognition software.  Despite best efforts to proofread,  errors can occur which can change the documentation meaning.

## 2023-09-09 NOTE — Discharge Instructions (Addendum)
Orthopedic Surgery Discharge Instructions  Patient name: Pamela Jefferson Fracture: right tibial shaft fracture Procedure Performed: right tibial shaft fracture open reduction internal fixation Date of Surgery: 09/09/2023 Surgeon: Willia Craze, MD  Activity: You are allowed to put as much weight on your leg as you would like. You can walk as much as you would like. You can perform household activities such as cleaning dishes, doing laundry, vacuuming, etc.  Incision Care: Your incision site has a dressing over it. That dressing should remain in place and dry at all times for a total of one week after surgery. After one week, you can remove the dressing. Underneath the dressing, you will find skin staples. You should leave these staples in place. They will be taken out in the office when the wound has healed. Do not pick, rub, or scrub at them. Do not put cream or lotion over the surgical area. After one week and once the dressing is off, it is okay to let soap and water run over your incision. Again, do not pick, scrub, or rub at the staples when bathing. Do not submerge (e.g., take a bath, swim, go in a hot tub, etc.) until six weeks after surgery. There may be some bloody drainage from the incision into the dressing after surgery. This is normal. You do not need to replace the dressing. Continue to leave it in place for the one week as instructed above. Should the dressing become saturated with blood or drainage, please call the office for further instructions.   Medications: You have been prescribed oxycodone. This is a narcotic pain medication and should only be taken as prescribed. You should not drink alcohol or operate heavy machinery (including driving) while taking this medication. The oxycodone can cause constipation as a side effect. For that reason, you have been prescribed senna and miralax. These are both laxatives. You do not need to take this medication if you develop diarrhea. Should you  remain constipated even while taking the senna and miralax, please use the miralax twice daily. Tylenol has been prescribed to be taken every 8 hours, which will give you additional pain relief.   You have been prescribed aspirin as a blood thinner. This medication is to be taken to prevent blood clots. Take 81 milligrams twice daily. You should refrain from using other blood thinners (warfarin, apixaban, plavix, xarelto, etc.) while using the aspirin. You will need to take this medication for a total of 6 weeks after your surgery.   You should not use over-the-counter NSAIDs (ibuprofen, Aleve, Celebrex, naproxen, meloxicam, etc.) for pain relief because aspirin is a similar medication. There can be side effects including but not limited to kidney injury and ulcers if you take these type of medications with the aspirin.  In order to set expectations for opioid prescriptions, you will only be prescribed opioids for a total of six weeks after surgery and, at two-weeks after surgery, your opioid prescription will start to tapered (decreased dosage and number of pills). If you have ongoing need for opioid medication six weeks after surgery, you will be referred to pain management. If you are already established with a provider that is giving you opioid medications, you should schedule an appointment with them for six weeks after surgery if you feel you are going to need another prescription. State law only allows for opioid prescriptions one week at a time. If you are running out of opioid medication near the end of the week, please call the office during  business hours before running out so I can send you another prescription.   Driving: You should not drive while taking narcotic pain medications. You should start getting back to driving slowly and you may want to try driving in a parking lot before doing anything more.   Diet: You are safe to resume your regular diet after surgery.   Reasons to Call the  Office After Surgery: You should feel free to call the office with any concerns or questions you have in the post-operative period, but you should definitely notify the office if you develop: -shortness of breath, chest pain, or trouble breathing -excessive bleeding, drainage, redness, or swelling around the surgical site -fevers, chills, or pain that is getting worse with each passing day -persistent nausea or vomiting -new weakness in the right leg, new or worsening numbness or tingling in the right leg -other concerns about your surgery  Follow Up Appointments: You have a follow up appointment scheduled with Dr. Christell Constant on 09/25/2023 at 9am. Please arrive on time to that appointment. At that visit, Dr. Christell Constant will remove your staples, look at your wounds, and get new x-rays of your fracture.   Office Information:  -Willia Craze, MD -Phone number: [phone] -Address: [address]       [place], [place] [address]

## 2023-09-09 NOTE — Telephone Encounter (Signed)
Orthopedic Telephone Note  Patient was seen in the ER last night. Follow up chart was sent to me this morning. She has a mid shaft tibia fracture. She has already had this fracture for 3 days. I am worried by the time that she gets to my office and surgery gets scheduled, further healing will have happened which would likely make her surgery harder to complete. She was also placed into a short leg splint. Typically, I use a long leg splint for tibia fractures. I do not see any films with her post-reduction in the splint. For that reason, I am not sure what position the fracture is in or if the soft tissues are getting pressure from a malreduced tibia. Accordingly, I recommended she come back to the ER. I will admit her and plan to fix her tibia fracture. I instructed her not to eat or drink anything today. She expressed understanding of this and will come back in to the ER later today.   London Sheer, MD Orthopedic Surgeon

## 2023-09-09 NOTE — Progress Notes (Signed)
Orthopedic Tech Progress Note Patient Details:  Pamela Jefferson November 24, 1995 [id number]  Ortho Devices Type of Ortho Device: Ace wrap, Cotton web roll, Stirrup splint, Short leg splint Ortho Device/Splint Location: RLE Ortho Device/Splint Interventions: Ordered, Application, Adjustment  did crutch training wit patient since she didn't know how to used them.   Post Interventions Patient Tolerated: Well, Ambulated well Instructions Provided: Poper ambulation with device, Care of device, Adjustment of device  Donald Pore 09/09/2023, 12:05 AM

## 2023-09-09 NOTE — ED Provider Triage Note (Signed)
Emergency Medicine Provider Triage Evaluation Note  Pamela Jefferson , a 27 y.o. female  was evaluated in triage.  Pt complains of leg pain. Pt has a R tib/fib fx on 12/18.  She's here to be admitted to orthopedist Pamela Jefferson for surgical management.  No other complaint  Review of Systems  Positive: As above Negative: As above  Physical Exam  BP 121/78 (BP Location: Right Arm)   Pulse 93   Temp 98.1 F (36.7 C) (Oral)   Resp 18   LMP 08/25/2023   SpO2 100%  Gen:   Awake, no distress   Resp:  Normal effort  MSK:   Moves extremities without difficulty  Other:    Medical Decision Making  Medically screening exam initiated at 5:30 PM.  Appropriate orders placed.  Pamela Jefferson was informed that the remainder of the evaluation will be completed by another provider, this initial triage assessment does not replace that evaluation, and the importance of remaining in the ED until their evaluation is complete.  I have sent a secure Epic message to Pamela Jefferson, notifying him of pt's presence.  Pt is aware of plan   Pamela Jefferson, Pamela Jefferson 09/09/23 1733

## 2023-09-09 NOTE — ED Provider Notes (Signed)
[place] EMERGENCY DEPARTMENT AT [facility] Provider Note  CSN: [id number] Arrival date & time: 09/08/23 1937  Chief Complaint(s) Leg Pain  HPI Pamela Jefferson is a 27 y.o. female seen at [facility] for right tib-fib fracture 3 days ago that was casted.  Patient presented here due to increased pain and decreased sensation of her distal foot. No additional trauma.  Patient was seen in the MSE process and their was concern for cast compression syndrome. Cast was removed. Patient reports that pain and sensation have improved.  The history is provided by the patient.    Past Medical History Past Medical History:  Diagnosis Date   Anemia    Anxiety    Chlamydia    Depression    "about ready to give up:", no help, just tired   Gonorrhea    Hx of gastric ulcer    Patient Active Problem List   Diagnosis Date Noted   Acute blood loss anemia 08/14/2022   SVD (spontaneous vaginal delivery) 08/13/2022   Normal postpartum course 08/13/2022   Encounter for elective induction of labor 09/02/2021   Depression    Home Medication(s) Prior to Admission medications   Medication Sig Start Date End Date Taking? Authorizing Provider  oxyCODONE (ROXICODONE) 5 MG immediate release tablet Take 0.5-1 tablets (2.5-5 mg total) by mouth every 6 (six) hours as needed for up to 5 days for severe pain (pain score 7-10). 09/09/23 09/14/23 Yes Fayola Meckes, Amadeo Garnet, MD  ibuprofen (ADVIL) 800 MG tablet Take 1 tablet (800 mg total) by mouth 3 (three) times daily. 07/29/23   Valinda Hoar, NP  iron polysaccharides (NIFEREX) 150 MG capsule Take 1 capsule (150 mg total) by mouth daily. 08/14/22   Dale [place], FNP  Prenatal Vit-Fe Fumarate-FA (PRENATAL MULTIVITAMIN) TABS tablet Take 2 tablets by mouth daily.    [provider]                                                                                                                                    Allergies Iodine and Shellfish  allergy  Review of Systems Review of Systems As noted in HPI  Physical Exam Vital Signs  I have reviewed the triage vital signs BP 137/78 (BP Location: Right Wrist)   Pulse 99   Temp (!) 96.1 F (35.6 C) (Temporal)   Resp 16   Ht 5\' 8"  (1.727 m)   Wt 100.7 kg   LMP 08/25/2023   SpO2 98%   BMI 33.76 kg/m   Physical Exam Vitals reviewed.  Constitutional:      General: She is not in acute distress.    Appearance: She is well-developed. She is not diaphoretic.  HENT:     Head: Normocephalic and atraumatic.     Right Ear: External ear normal.     Left Ear: External ear normal.     Nose: Nose normal.  Eyes:  General: No scleral icterus.    Conjunctiva/sclera: Conjunctivae normal.  Neck:     Trachea: Phonation normal.  Cardiovascular:     Rate and Rhythm: Normal rate and regular rhythm.  Pulmonary:     Effort: Pulmonary effort is normal. No respiratory distress.     Breath sounds: No stridor.  Abdominal:     General: There is no distension.  Musculoskeletal:        General: Normal range of motion.     Cervical back: Normal range of motion.     Right lower leg: Swelling and tenderness present.     Right foot: Decreased capillary refill. Normal pulse.     Left foot: Decreased capillary refill. Normal pulse.     Comments: Sensation equal bilaterally  Neurological:     Mental Status: She is alert and oriented to person, place, and time.  Psychiatric:        Behavior: Behavior normal.     ED Results and Treatments Labs (all labs ordered are listed, but only abnormal results are displayed) Labs Reviewed - No data to display                                                                                                                       EKG  EKG Interpretation Date/Time:    Ventricular Rate:    PR Interval:    QRS Duration:    QT Interval:    QTC Calculation:   R Axis:      Text Interpretation:         Radiology DG Ankle Complete Right Result  Date: 09/08/2023 CLINICAL DATA:  Fall, pain EXAM: RIGHT ANKLE - COMPLETE 3+ VIEW COMPARISON:  Tibia/fibula series today FINDINGS: Oblique fracture through the distal right tibial shaft with mild displacement. Nondisplaced distal fibular metaphyseal fracture. No subluxation or dislocation. IMPRESSION: Distal tibial shaft fracture. Nondisplaced distal fibular metaphyseal fracture. Electronically Signed   By: Charlett Nose M.D.   On: 09/08/2023 22:50   DG Tibia/Fibula Right Result Date: 09/08/2023 CLINICAL DATA:  Fall, pain EXAM: RIGHT TIBIA AND FIBULA - 2 VIEW COMPARISON:  Ankle series today FINDINGS: Oblique fracture through the distal right tibial shaft. Mild displacement. No subluxation or dislocation. Nondisplaced oblique fracture through the distal fibular metaphysis. IMPRESSION: Oblique distal tibial shaft fracture. Nondisplaced distal fibular metaphyseal fracture. Electronically Signed   By: Charlett Nose M.D.   On: 09/08/2023 22:50    Medications Ordered in ED Medications  oxyCODONE-acetaminophen (PERCOCET/ROXICET) 5-325 MG per tablet 1 tablet (1 tablet Oral Given 09/08/23 2101)   Procedures Procedures  (including critical care time) Medical Decision Making / ED Course   Medical Decision Making Amount and/or Complexity of Data Reviewed Radiology: ordered and independent interpretation performed. Decision-making details documented in ED Course.  Risk Prescription drug management.    Presentation was concerning for cast compression syndrome. Xrays confirmed fractures noted previously. No acute injuries.  Compartments are soft and patient's pain and sensation has improved after cast was removed.  Will need to splint.  Patient already has crutches.  The patient does not want to follow-up at [facility] and would like to continue care within the [facility] system.  Will provide her contact information for on-call orthopedist.    Final Clinical Impression(s) / ED Diagnoses Final diagnoses:   Tibia/fibula fracture, right, closed, initial encounter   The patient appears reasonably screened and/or stabilized for discharge and I doubt any other medical condition or other [facility] requiring further screening, evaluation, or treatment in the ED at this time. I have discussed the findings, Dx and Tx plan with the patient/family who expressed understanding and agree(s) with the plan. Discharge instructions discussed at length. The patient/family was given strict return precautions who verbalized understanding of the instructions. No further questions at time of discharge.  Disposition: Discharge  Condition: Good  ED Discharge Orders          Ordered    oxyCODONE (ROXICODONE) 5 MG immediate release tablet  Every 6 hours PRN        09/09/23 0004            [facility] narcotic database reviewed and no active prescriptions noted.   Follow Up: London Sheer, MD [address] [place] [address] [phone]  Call  to schedule an appointment for close follow up    This chart was dictated using voice recognition software.  Despite best efforts to proofread,  errors can occur which can change the documentation meaning.    Nira Conn, MD 09/09/23 [phone]

## 2023-09-09 NOTE — Anesthesia Preprocedure Evaluation (Signed)
Anesthesia Evaluation  Patient identified by MRN, date of birth, ID band Patient awake    Reviewed: Allergy & Precautions, H&P , NPO status , Patient's Chart, lab work & pertinent test results  Airway Mallampati: II   Neck ROM: full    Dental   Pulmonary former smoker   breath sounds clear to auscultation       Cardiovascular negative cardio ROS  Rhythm:regular Rate:Normal     Neuro/Psych  PSYCHIATRIC DISORDERS Anxiety Depression       GI/Hepatic H/o gastric ulcer   Endo/Other    Renal/GU      Musculoskeletal   Abdominal   Peds  Hematology   Anesthesia Other Findings   Reproductive/Obstetrics                             Anesthesia Physical Anesthesia Plan  ASA: 2  Anesthesia Plan: General   Post-op Pain Management:    Induction: Intravenous  PONV Risk Score and Plan: 3 and Ondansetron, Dexamethasone, Midazolam and Treatment may vary due to age or medical condition  Airway Management Planned: Oral ETT  Additional Equipment:   Intra-op Plan:   Post-operative Plan: Extubation in OR  Informed Consent: I have reviewed the patients History and Physical, chart, labs and discussed the procedure including the risks, benefits and alternatives for the proposed anesthesia with the patient or authorized representative who has indicated his/her understanding and acceptance.     Dental advisory given  Plan Discussed with: CRNA, Anesthesiologist and Surgeon  Anesthesia Plan Comments:        Anesthesia Quick Evaluation

## 2023-09-09 NOTE — Op Note (Signed)
Orthopedic Surgery Operative Report   Procedure: Right tibia fracture open reduction internal fixation with intramedullary rod   Modifier: none   Date of procedure: 09/09/2023   Patient name: Pamela Jefferson   MRN: [id number]  DOB: October 13, 1995   Surgeon: Willia Craze, MD Assistant: None Pre-operative diagnosis: right tibial shaft fracture Post-operative diagnosis: same as above Findings: displaced right tibia shaft fracture   Specimens: none Anesthesia: general EBL: 50cc Complications: none Pre-incision antibiotic: ancef TXA given prior to incision as well   Implants:  Implant Name Type Inv. Item Serial No. Manufacturer Lot No. LRB No. Used Action  LOG [id number] - SYNTHES TITANIUM TIBIAL NAIL SET - 1          NAIL TIBIAL 10X38 - [id number] Nail NAIL TIBIAL 10X38  [employer] [id number] Right 1 Implanted  SCREW TRIGEN LOW PROF 5.0X60 - [id number] Screw SCREW TRIGEN LOW PROF 5.0X60  [employer] [id number] Right 1 Implanted  SCREW TRIGEN LOW PROF 5.0X60 - [id number] Screw SCREW TRIGEN LOW PROF 5.0X60  [employer] [id number] Right 1 Implanted  SCREW TRIGEN LOW PROF 5.0X55 - [id number] Screw SCREW TRIGEN LOW PROF 5.0X55  [employer] [id number] Right 1 Implanted  SCREW TRIGEN LOW PROF 5.0X32.5 - [id number] Screw SCREW TRIGEN LOW PROF 5.0X32.5  [employer] [id number] Right 1 Implanted and Explanted  SCREW TRIGEN LOW PROF 5.0X42.5 - [id number] Screw SCREW TRIGEN LOW PROF 5.0X42.5  [employer] [id number] Right 1 Implanted  SCREW TRIGEN LOW PROF 5.0X30 - [id number] Screw SCREW TRIGEN LOW PROF 5.0X30  [employer] [id number] Right 1 Implanted and Explanted  SCREW TRIGEN LOW PROF 5.0X37.5 - [id number] Screw SCREW TRIGEN LOW PROF 5.0X37.5  [employer] [id number] Right 1 Wasted  SCREW TRIGEN LOW PROF 5.0X37.5 - [id number] Screw SCREW TRIGEN LOW PROF 5.0X37.5  [employer] [id number] Right 1 Implanted       Indication for procedure: Patient is a 27 y.o. female who presented to the ER after a fall. The patient had right leg pain and x-rays revealed a right tibial shaft fracture. The patient was admitted to the orthopedic service. I met the patient and discussed the fracture. I recommended operative management in the form of intramedullary rodding to stabilize the fracture and allow for mobilization. Explained the risks of this procedure included, but were not limited to: nonunion, malunion, fixation failure, infection, bleeding, stiffness, knee pain, need for additional procedures, deep vein thrombosis, pulmonary embolism, MI, arrhythmia, and death. The alternatives of this surgery would be to treat the fracture with immobilization or to perform no intervention. After our discussion, patient elected to proceed with surgery.    Procedure Description: The patient was met in the pre-operative holding area. The patient's identity and consent were verified. The operative site was marked by myself. The patient's remaining questions about the surgery were answered. The patient was brought back to the operating room. General anesthesia was induced. The patient was transferred to the OR table. All bony prominences were well padded. The surgical area was cleansed with alcohol. Ancef and TXA were administered by anesthesia. The patient's skin was then prepped and draped in a standard, sterile fashion. A time out was performed that identified the patient, the procedure, and the operative site. All team members agreed with what was stated in the time out.    An incision was made just proximal and in line with the superior pole of the patella.  The incision was taken sharply down through skin, dermis, and fat to the level of the quadriceps tendon. The tendon was then incised longitudinally in line with the incision.  Attention was then turned to fracture reduction. Under  fluoroscopic guidance, an incision over the medial tibia near the fracture was made. A key elevator was used to elevate the soft tissue off of the bone. A reduction maneuver was performed and a standard reduction clamp was placed across the fracture site. Satisfactory reduction was gotten with the clamp in both the AP and lateral planes.   A soft tissue protective sleeve was then inserted via the suprapatellar incision underneath the patella onto the proximal tibia. A guide wire was placed through the sleeve onto the proximal tibia. Adjustments were made on both the AP and lateral fluoroscopic images until proper starting point was obtained. The guide pin was then advanced into the proximal tibia. An entry reamer was used to open the proximal tibial canal under fluoroscopic guidance. The pin and reamer were removed. A long guide wire was placed down the tibial canal. It was advanced to the physeal scar at the distal tibia. The length of the nail was estimated off of the guide wire. The nail measure about 39 so a 38 was selected. A 9mm reamer was inserted over the guidewire and used to ream the tibia canal. It was advanced down past the isthmus under fluoroscopic guidance. The canal was serially reamed with increasing sized reamers until a 11mm reamer at which point there was chatter. A 10 nail was selected. The nail was advanced over the guidewire. It was advanced until the end of the nail was at the physeal scar of the distal tibia and appeared to be distal to the joint line proximally.    A drill sleeve was inserted through the targeting jig on the proximal tibia. An incision was made sharply through skin and dermis at the site of the sleeve. A tonsil was used to bluntly dissect down to the level of bone. The sleeve was advanced down to the bone. A drill was used to drill the proximal tibia bicortically. A depth gauge was used to estimate the screw length. That length screw was then inserted and advanced until  there was good purchase. The same process was repeated to insert 2 more screws in the proximal tibia.    The C arm was then brought to the ankle in a lateral position to obtain perfect circles at the distal interlocking holes. An incision was made over the distal interlocking holes on the medial aspect of the distal tibia. This incision was taken down through skin and dermis. A drill was inserted into the wound and placed over the interlocking hole using perfect circle technique. The hole was then drilled bicortically. A depth gauge was used to estimate the screw length. A 32.40mm screw was selected for the more proximal hole. There was not good purchase with this screw. Several attempts were made to get a better purchase but it could not be obtained. The screw was removed and left out. An incision was then made over the most distal hole and taken sharply down through skin and dermis. A drill was placed over the hole using perfect circle technique. The tibia was drilled bicortically. A depth gauge was used to estimate the length of the screw. It measured 42.79mm and this size screw was inserted. It was advanced and there was good purchase. Since there was not good purchase with the first interlocking  screw, decision was made to insert the anterior to posterior screw. The C arm was brought in to the AP view. A perfect circle was obtained. An incision was made sharply through the skin and dermis for that interlocking hole. A tonsil was used to bluntly dissect the soft tissue out of the way over the bone. A drill was placed over the hole and the tibia was drilled bicortically using perfect circle technique. A depth gauge was used to estimate the screw length. That length screw was then inserted until there was good purchase.    Final AP and lateral fluoroscopic images were then taken of the ankle, fracture, and knee showing satisfactory reduction and placement of the rod. The wounds were copiously irrigated with  sterile saline. The quadriceps tendon was reapproxiamted with 0 vicryl. The deep dermal layer was closed with 2-0 vicryl. The skin was closed with staples. Dressings were applied. All counts were correct at the end of the case. Patient was transferred back to a hospital bed. The patient was awakened from anesthesia and brought back to the post-anesthesia care unit in stable condition.     Post-operative plan: The patient will recover in the post-anesthesia care unit and then go to the floor on the medicine service. The patient will receive two post-operative doses of ancef. The patient will be weight bearing as tolerated. The patient will work with physical therapy. The patient will likely discharge home in the next couple of days. She will get ASA 81mg  BID at discharge for dvt ppx.     Willia Craze, MD Orthopedic Surgeon

## 2023-09-09 NOTE — Brief Op Note (Signed)
09/09/2023  10:18 PM  PATIENT:  Pamela Jefferson  27 y.o. female  PRE-OPERATIVE DIAGNOSIS:  Right Tibia Fracture  POST-OPERATIVE DIAGNOSIS:  Right Tibia Fracture  PROCEDURE:  Procedure(s): INTRAMEDULLARY (IM) NAIL TIBIAL (Right)  SURGEON:  Surgeons and Role:    London Sheer, MD - Primary  PHYSICIAN ASSISTANT: none  ASSISTANTS: none   ANESTHESIA:   general  EBL:  50 mL   BLOOD ADMINISTERED:none  DRAINS: none   LOCAL MEDICATIONS USED:  NONE  SPECIMEN:  No Specimen  DISPOSITION OF SPECIMEN:  N/A  COUNTS:  YES  TOURNIQUET: NONE  DICTATION: .Note written in EPIC  PLAN OF CARE: Admit to inpatient   PATIENT DISPOSITION:  PACU - hemodynamically stable.   Delay start of Pharmacological VTE agent (>24hrs) due to surgical blood loss or risk of bleeding: no

## 2023-09-09 NOTE — Anesthesia Procedure Notes (Signed)
Procedure Name: Intubation Date/Time: 09/09/2023 7:39 PM  Performed by: Dairl Ponder, CRNAPre-anesthesia Checklist: Patient identified, Emergency Drugs available, Suction available and Patient being monitored Patient Re-evaluated:Patient Re-evaluated prior to induction Oxygen Delivery Method: Circle System Utilized Preoxygenation: Pre-oxygenation with 100% oxygen Induction Type: IV induction Ventilation: Mask ventilation without difficulty Laryngoscope Size: Mac and 3 Grade View: Grade I Tube type: Oral Tube size: 7.0 mm Number of attempts: 1 Airway Equipment and Method: Stylet and Oral airway Placement Confirmation: ETT inserted through vocal cords under direct vision, positive ETCO2 and breath sounds checked- equal and bilateral Secured at: 22 cm Tube secured with: Tape Dental Injury: Teeth and Oropharynx as per pre-operative assessment

## 2023-09-09 NOTE — Discharge Summary (Signed)
Orthopedic Surgery Discharge Summary  Patient name: Pamela Jefferson Patient MRN: [id number] Admit date: 09/09/2023 Discharge date: 09/10/2023  Attending physician: Willia Craze, MD Final diagnosis: right tibial shaft fracture Findings: right minimally displaced tibial shaft fracture  Hospital course: Patient is a 27 y.o. female who was sustained a right fracture after a fall. The patient was admitted to the orthopedic service for planned operative intervention. The patient underwent right tibial shaft fracture open reduction internal fixation on 09/09/2023. The patient had significant pain immediately after surgery, but pain eventually was controlled with a multimodal regimen including oxycodone. Labs during the hospitalization revealed no significant blood loss or electrolyte abnormalities. The patient worked with physical therapy who recommended discharge to home. The patient was tolerating an oral diet without issue and was voiding spontaneously after surgery. The patient's vitals were stable on the day of discharge. The patient was medically ready for discharge and was discharge to home on post-operative day one.  Instructions:   Orthopedic Surgery Discharge Instructions  Patient name: Pamela Jefferson Fracture: right tibial shaft fracture Procedure Performed: right tibial shaft fracture open reduction internal fixation Date of Surgery: 09/09/2023 Surgeon: Willia Craze, MD  Activity: You are allowed to put as much weight on your leg as you would like. You can walk as much as you would like. You can perform household activities such as cleaning dishes, doing laundry, vacuuming, etc.  Incision Care: Your incision site has a dressing over it. That dressing should remain in place and dry at all times for a total of one week after surgery. After one week, you can remove the dressing. Underneath the dressing, you will find skin staples. You should leave these staples in place. They will be  taken out in the office when the wound has healed. Do not pick, rub, or scrub at them. Do not put cream or lotion over the surgical area. After one week and once the dressing is off, it is okay to let soap and water run over your incision. Again, do not pick, scrub, or rub at the staples when bathing. Do not submerge (e.g., take a bath, swim, go in a hot tub, etc.) until six weeks after surgery. There may be some bloody drainage from the incision into the dressing after surgery. This is normal. You do not need to replace the dressing. Continue to leave it in place for the one week as instructed above. Should the dressing become saturated with blood or drainage, please call the office for further instructions.   Medications: You have been prescribed oxycodone. This is a narcotic pain medication and should only be taken as prescribed. You should not drink alcohol or operate heavy machinery (including driving) while taking this medication. The oxycodone can cause constipation as a side effect. For that reason, you have been prescribed senna and miralax. These are both laxatives. You do not need to take this medication if you develop diarrhea. Should you remain constipated even while taking the senna and miralax, please use the miralax twice daily. Tylenol has been prescribed to be taken every 8 hours, which will give you additional pain relief.   You have been prescribed aspirin as a blood thinner. This medication is to be taken to prevent blood clots. Take 81 milligrams twice daily. You should refrain from using other blood thinners (warfarin, apixaban, plavix, xarelto, etc.) while using the aspirin. You will need to take this medication for a total of 6 weeks after your surgery.   You should not use over-the-counter  NSAIDs (ibuprofen, Aleve, Celebrex, naproxen, meloxicam, etc.) for pain relief because aspirin is a similar medication. There can be side effects including but not limited to kidney injury and  ulcers if you take these type of medications with the aspirin.  In order to set expectations for opioid prescriptions, you will only be prescribed opioids for a total of six weeks after surgery and, at two-weeks after surgery, your opioid prescription will start to tapered (decreased dosage and number of pills). If you have ongoing need for opioid medication six weeks after surgery, you will be referred to pain management. If you are already established with a provider that is giving you opioid medications, you should schedule an appointment with them for six weeks after surgery if you feel you are going to need another prescription. State law only allows for opioid prescriptions one week at a time. If you are running out of opioid medication near the end of the week, please call the office during business hours before running out so I can send you another prescription.   Driving: You should not drive while taking narcotic pain medications. You should start getting back to driving slowly and you may want to try driving in a parking lot before doing anything more.   Diet: You are safe to resume your regular diet after surgery.   Reasons to Call the Office After Surgery: You should feel free to call the office with any concerns or questions you have in the post-operative period, but you should definitely notify the office if you develop: -shortness of breath, chest pain, or trouble breathing -excessive bleeding, drainage, redness, or swelling around the surgical site -fevers, chills, or pain that is getting worse with each passing day -persistent nausea or vomiting -new weakness in the right leg, new or worsening numbness or tingling in the right leg -other concerns about your surgery  Follow Up Appointments: You have a follow up appointment scheduled with Dr. Christell Constant on 09/25/2023 at 9am. Please arrive on time to that appointment. At that visit, Dr. Christell Constant will remove your staples, look at your wounds,  and get new x-rays of your fracture.   Office Information:  -Willia Craze, MD -Phone number: [phone] -Address: [address]       [place], [place] [address]

## 2023-09-09 NOTE — H&P (Signed)
Orthopedic Surgery H&P Note  Assessment: Patient is a 27 y.o. female with right tibial shaft fracture   Plan: -Planning for operative fixation tonight -Diet: NPO for procedure -DVT ppx: aspirin 81mg  BID post-operatively  -Antibiotics: ancef on call to OR -TXA on call to OR -Weight bearing status: NWB RLE -PT evaluate and treat post-operatively -Pain control -Dispo: pending completion of operative plans   Discussed recommendation for operative intervention in the form of right tibia fracture open reduction internal fixation with intramedullary rod. Explained the risks of this procedure included, but were not limited to: nonunion, malunion, hardware failure, infection, bleeding, stiffness, knee pain, need for additional procedures, deep vein thrombosis, pulmonary embolism, and death. The benefits of this procedure would be to promote fracture healing by providing stability and to allow for early mobilization. The alternatives of this surgery would be to treat the fracture with immobilization in a splint/brace/cast or to do no intervention. The patient's questions were answered to her satisfaction. After this discussion, patient elected to proceed with surgery. Informed consent was obtained.   ___________________________________________________________________________   Chief complaint: right leg pain  History:  Patient is a 27 y.o. female who had a fall and noted acute onset of right leg pain. She initially presented to [facility] and was found to have a right tibial shaft fracture. She was placed in a long leg cast. She came to [facility] ER last night for leg pain. The cast was removed and she was converted to a short leg splint. I called the patient and advised her to come back. She presented again today with right leg pain. No other extremity pain.   Review of systems: General: denies fevers and chills, myalgias Neurologic: denies recent changes in vision, slurred speech Abdomen: denies  nausea, vomiting, hematemesis Respiratory: denies cough, shortness of breath  Past medical history:  Depression/anxiety History of STDs  Allergies: iodine   Past surgical history:  Left arm fracture ORIF   Social history: Denies use of nicotine-containing products (cigarettes, vaping, smokeless, etc.) Alcohol use: yes, social Denies use of recreational drugs  Family history: -reviewed and not pertinent to tibia fracture   Physical Exam:  BMI of 33.8  General: no acute distress, appears stated age Neurologic: alert, answering questions appropriately, following commands Cardiovascular: regular rate, no cyanosis Respiratory: unlabored breathing on room air, symmetric chest rise Psychiatric: appropriate affect, normal cadence to speech  MSK:   -Right lower extremity  No tenderness to palpation over extremity, except the leg. Swelling over the anterior leg. No open wounds.  EHL/TA/GSC intact Plantarflexes and dorsiflexes toes Sensation intact to light touch in sural, saphenous, tibial, deep peroneal, and superficial peroneal nerve distributions Foot warm and well perfused  Imaging: XR of the right tibia from 09/08/2023 was independently reviewed and interpreted, showing a distal 1/3 tibial shaft fracture that is minimally displaced. A nondisplaced distal fibula fracture is also seen. No other fractures seen. No dislocation seen.    Patient name: Pamela Jefferson Patient MRN: [id number] Date: 09/09/23

## 2023-09-09 NOTE — ED Triage Notes (Signed)
Pt recently seen for tib/fib fx and referred back to ED by Dr. Christell Constant for admission and more urgent surgery. Pt denies new injury. Pt wearing short leg splint at this time.

## 2023-09-10 LAB — CBC
HCT: 33.1 % — ABNORMAL LOW (ref 36.0–46.0)
Hemoglobin: 10.6 g/dL — ABNORMAL LOW (ref 12.0–15.0)
MCH: 28.6 pg (ref 26.0–34.0)
MCHC: 32 g/dL (ref 30.0–36.0)
MCV: 89.5 fL (ref 80.0–100.0)
Platelets: 448 10*3/uL — ABNORMAL HIGH (ref 150–400)
RBC: 3.7 MIL/uL — ABNORMAL LOW (ref 3.87–5.11)
RDW: 14.5 % (ref 11.5–15.5)
WBC: 16.8 10*3/uL — ABNORMAL HIGH (ref 4.0–10.5)
nRBC: 0 % (ref 0.0–0.2)

## 2023-09-10 LAB — BASIC METABOLIC PANEL
Anion gap: 10 (ref 5–15)
BUN: 8 mg/dL (ref 6–20)
CO2: 23 mmol/L (ref 22–32)
Calcium: 8.8 mg/dL — ABNORMAL LOW (ref 8.9–10.3)
Chloride: 104 mmol/L (ref 98–111)
Creatinine, Ser: 0.82 mg/dL (ref 0.44–1.00)
GFR, Estimated: >60 mL/min (ref >60)
Glucose, Bld: 122 mg/dL — ABNORMAL HIGH (ref 70–99)
Potassium: 3.8 mmol/L (ref 3.5–5.1)
Sodium: 137 mmol/L (ref 135–145)

## 2023-09-10 MED ORDER — ASPIRIN 81 MG PO TBEC: 81.0000 mg | DELAYED_RELEASE_TABLET | Freq: Every day | ORAL | 0 refills | Status: AC

## 2023-09-10 MED ORDER — POLYETHYLENE GLYCOL 3350 17 G PO PACK: 17.0000 g | PACK | Freq: Every day | ORAL | 0 refills | Status: AC

## 2023-09-10 MED ORDER — ACETAMINOPHEN 500 MG PO TABS: 1000.0000 mg | ORAL_TABLET | Freq: Three times a day (TID) | ORAL | 0 refills | Status: AC

## 2023-09-10 MED ORDER — METHOCARBAMOL 500 MG PO TABS: 500.0000 mg | ORAL_TABLET | Freq: Four times a day (QID) | ORAL | 0 refills | Status: AC

## 2023-09-10 MED ORDER — SENNA 8.6 MG PO TABS: 1.0000 | ORAL_TABLET | Freq: Two times a day (BID) | ORAL | 0 refills | Status: AC

## 2023-09-10 MED ORDER — OXYCODONE HCL 5 MG PO TABS: 5.0000 mg | ORAL_TABLET | ORAL | 0 refills | Status: DC | PRN

## 2023-09-10 MED ORDER — ACETAMINOPHEN 500 MG PO TABS: 1000.0000 mg | ORAL_TABLET | Freq: Three times a day (TID) | ORAL | 0 refills | Status: DC

## 2023-09-10 NOTE — Care Management (Cosign Needed)
    Durable Medical Equipment  (From admission, onward)           Start     Ordered   09/10/23 1324  For home use only DME Walker rolling  Once       Question Answer Comment  Walker: With 5 Inch Wheels   Patient needs a walker to treat with the following condition Weakness      09/10/23 1324

## 2023-09-10 NOTE — Evaluation (Signed)
Physical Therapy Evaluation Patient Details Name: Pamela Jefferson MRN: [id number] DOB: 12-17-1995 Today's Date: 09/10/2023  History of Present Illness  Patient is a 27 y.o. female with right tibia fracture status post intramedullary rodding on 12/21. no PMH noted.  Clinical Impression  Pt presents with admitting diagnosis above. Pt today was able to ambulate in hallway with RW supervision and navigate stairs independently. PTA pt was fully independent. Pt mobility status is adequate for safe DC home and is DC from PT. No PT follow up needed however pt would benefit from RW upon DC. Pt anticipates DC home today.        If plan is discharge home, recommend the following: Assist for transportation   Can travel by private vehicle        Equipment Recommendations Rolling walker (2 wheels)  Recommendations for Other Services       Functional Status Assessment Patient has had a recent decline in their functional status and demonstrates the ability to make significant improvements in function in a reasonable and predictable amount of time.     Precautions / Restrictions Precautions Precautions: Fall Restrictions Weight Bearing Restrictions Per Provider Order: Yes RLE Weight Bearing Per Provider Order: Weight bearing as tolerated      Mobility  Bed Mobility Overal bed mobility: Modified Independent                  Transfers Overall transfer level: Needs assistance Equipment used: Rolling walker (2 wheels) Transfers: Sit to/from Stand Sit to Stand: Supervision           General transfer comment: Cues for hand placement.    Ambulation/Gait Ambulation/Gait assistance: Supervision, Contact guard assist Gait Distance (Feet): 250 Feet Assistive device: Rolling walker (2 wheels) Gait Pattern/deviations:  (hop to) Gait velocity: decreased     General Gait Details: no LOB noted. Pt with hop to pattern not wanting to put any weight through RLE despite being WBAT. 1  standing rest break  Stairs Stairs: Yes Stairs assistance: Independent Stair Management: Two rails, Step to pattern, Backwards, Forwards Number of Stairs: 2 General stair comments: Pt able to hop up and down steps using rails. no LOB noted.  Wheelchair Mobility     Tilt Bed    Modified Rankin (Stroke Patients Only)       Balance Overall balance assessment: Mild deficits observed, not formally tested                                           Pertinent Vitals/Pain Pain Assessment Pain Assessment: 0-10 Pain Score: 8  Pain Location: RLE Pain Descriptors / Indicators: Constant, Discomfort, Grimacing, Sharp, Burning Pain Intervention(s): Monitored during session, Limited activity within patient's tolerance    Home Living Family/patient expects to be discharged to:: Private residence Living Arrangements: Spouse/significant other;Children Available Help at Discharge: Family;Available PRN/intermittently Type of Home: House Home Access: Stairs to enter Entrance Stairs-Rails: Can reach both Entrance Stairs-Number of Steps: 7 Alternate Level Stairs-Number of Steps: 15 Home Layout: Two level;Bed/bath upstairs;Able to live on main level with bedroom/bathroom Home Equipment: Crutches      Prior Function Prior Level of Function : Independent/Modified Independent;Working/employed;Driving;History of Falls (last six months)             Mobility Comments: Ind no AD ADLs Comments: Ind     Extremity/Trunk Assessment   Upper Extremity Assessment Upper Extremity Assessment: Overall [facility]  for tasks assessed    Lower Extremity Assessment Lower Extremity Assessment: RLE deficits/detail RLE Deficits / Details: R tibia fx s/p ORIF    Cervical / Trunk Assessment Cervical / Trunk Assessment: Normal  Communication   Communication Communication: No apparent difficulties  Cognition Arousal: Alert Behavior During Therapy: WFL for tasks assessed/performed Overall  Cognitive Status: Within Functional Limits for tasks assessed                                          General Comments General comments (skin integrity, edema, etc.): VSS on RA    Exercises     Assessment/Plan    PT Assessment Patient does not need any further PT services  PT Problem List         PT Treatment Interventions      PT Goals (Current goals can be found in the Care Plan section)  Acute Rehab PT Goals PT Goal Formulation: All assessment and education complete, DC therapy    Frequency       Co-evaluation               AM-PAC PT "6 Clicks" Mobility  Outcome Measure Help needed turning from your back to your side while in a flat bed without using bedrails?: None Help needed moving from lying on your back to sitting on the side of a flat bed without using bedrails?: None Help needed moving to and from a bed to a chair (including a wheelchair)?: None Help needed standing up from a chair using your arms (e.g., wheelchair or bedside chair)?: A Little Help needed to walk in hospital room?: A Little Help needed climbing 3-5 steps with a railing? : None 6 Click Score: 22    End of Session Equipment Utilized During Treatment: Gait belt Activity Tolerance: Patient tolerated treatment well Patient left: in bed;with call bell/phone within reach Nurse Communication: Mobility status PT Visit Diagnosis: Other abnormalities of gait and mobility (R26.89)    Time: 7829-5621 PT Time Calculation (min) (ACUTE ONLY): 36 min   Charges:   PT Evaluation $PT Eval Moderate Complexity: 1 Mod PT Treatments $Gait Training: 8-22 mins PT General Charges $$ ACUTE PT VISIT: 1 Visit         Shela Nevin, PT, DPT Acute Rehab Services [phone]   Gladys Damme 09/10/2023, 1:46 PM

## 2023-09-10 NOTE — Progress Notes (Signed)

## 2023-09-10 NOTE — Progress Notes (Signed)
Orthopedic Surgery Progress Note   Assessment: Patient is a 27 y.o. female with right tibia fracture status post intramedullary rodding   Plan: -Operative plans: complete -Diet: regular  -DVT ppx: aspirin 81mg  BID -Antibiotics: ancef x2 post-op doses -Weight bearing status: as tolerated -PT evaluate and treat -Pain control -Anticipate discharge to home today  ___________________________________________________________________________  Subjective: No acute events overnight. Pain well controlled with oral medications. Wants to go home today.    Physical Exam:  General: no acute distress, appears stated age Neurologic: alert, answering questions appropriately, following commands Respiratory: unlabored breathing on room air, symmetric chest rise Psychiatric: appropriate affect, normal cadence to speech  MSK:   -Right lower extremity  Dressings over leg c/d/i EHL/TA/GSC intact Plantarflexes and dorsiflexes toes Sensation intact to light touch in sural, saphenous, tibial, deep peroneal, and superficial peroneal nerve distributions Foot warm and well perfused   Patient name: Pamela Jefferson Patient MRN: [id number] Date: 09/10/23

## 2023-09-10 NOTE — TOC Transition Note (Signed)
Transition of Care [facility]) - Discharge Note   Patient Details  Name: Sovanna Luquette MRN: [id number] Date of Birth: [birth year]  Transition of Care [facility]) CM/SW Contact:  Ronny Bacon, RN Phone Number: 09/10/2023, 1:04 PM   Clinical Narrative:  Patient is being discharged today. Secure chat received from floor nurse regarding patient needing rolling walker. Rolling walker ordered through [place] with [employer] to be delivered at bedside.     Final next level of care: Home/Self Care Barriers to Discharge: No Barriers Identified   Patient Goals and CMS Choice            Discharge Placement                       Discharge Plan and Services Additional resources added to the After Visit Summary for                  DME Arranged: Walker rolling DME Agency: [employer] Date DME Agency Contacted: 09/10/23 Time DME Agency Contacted: [phone] Representative spoke with at DME Agency: Vaughan Basta            Social Drivers of Health (SDOH) Interventions SDOH Screenings   Food Insecurity: No Food Insecurity (08/13/2022)  Housing: Low Risk  (08/13/2022)  Transportation Needs: No Transportation Needs (08/13/2022)  Utilities: Not At Risk (09/10/2023)  Depression (PHQ2-9): Medium Risk (08/17/2021)  Tobacco Use: Medium Risk (09/09/2023)     Readmission Risk Interventions     No data to display

## 2023-09-11 NOTE — Anesthesia Postprocedure Evaluation (Signed)
Anesthesia Post Note  Patient: Pamela Jefferson  Procedure(s) Performed: INTRAMEDULLARY (IM) NAIL TIBIAL (Right: Leg Lower)     Patient location during evaluation: PACU Anesthesia Type: General Level of consciousness: awake and alert Pain management: pain level controlled Vital Signs Assessment: post-procedure vital signs reviewed and stable Respiratory status: spontaneous breathing, nonlabored ventilation, respiratory function stable and patient connected to nasal cannula oxygen Cardiovascular status: blood pressure returned to baseline and stable Postop Assessment: no apparent nausea or vomiting Anesthetic complications: no   No notable events documented.  Last Vitals:  Vitals:   09/10/23 0502 09/10/23 0851  BP: 125/75 139/77  Pulse: 82 75  Resp: 18 18  Temp: 36.9 C 36.9 C  SpO2: 96% 98%    Last Pain:  Vitals:   09/10/23 0937  TempSrc:   PainSc: 9                  [name] S

## 2023-09-12 ENCOUNTER — Encounter (HOSPITAL_COMMUNITY): Payer: Self-pay | Admitting: Orthopedic Surgery

## 2023-09-14 ENCOUNTER — Telehealth: Payer: Self-pay

## 2023-09-14 MED ORDER — OXYCODONE HCL 5 MG PO TABS: 5.0000 mg | ORAL_TABLET | ORAL | 0 refills | Status: AC | PRN

## 2023-09-14 NOTE — Telephone Encounter (Signed)
Pt called and lm on vm for triage advised that she is having severe pain in her ankle and knee. She is s/p an ORIF right tib fx 09/09/2023. She states that she was up and walking around her home doing light activity as directed and now she is in pain and wants to know if she can have something to help her manage. Call back is [phone]

## 2023-09-14 NOTE — Telephone Encounter (Signed)
I called and advised that her meds were sent in for her.

## 2023-09-14 NOTE — Addendum Note (Signed)
Addended by: Willia Craze on: 09/14/2023 01:39 PM   Modules accepted: Orders

## 2023-09-18 ENCOUNTER — Other Ambulatory Visit: Payer: Self-pay

## 2023-09-18 ENCOUNTER — Emergency Department (HOSPITAL_COMMUNITY)
Admission: EM | Admit: 2023-09-18 | Discharge: 2023-09-19 | Discharge status: Against Medical Advice | Payer: Medicaid Other | Attending: Medical | Admitting: Medical

## 2023-09-18 ENCOUNTER — Emergency Department (HOSPITAL_COMMUNITY): Payer: Medicaid Other

## 2023-09-18 ENCOUNTER — Encounter (HOSPITAL_COMMUNITY): Payer: Self-pay

## 2023-09-18 DIAGNOSIS — M79604 Pain in right leg: Secondary | ICD-10-CM | POA: Insufficient documentation

## 2023-09-18 DIAGNOSIS — M25561 Pain in right knee: Secondary | ICD-10-CM | POA: Diagnosis not present

## 2023-09-18 DIAGNOSIS — W182XXA Fall in (into) shower or empty bathtub, initial encounter: Secondary | ICD-10-CM | POA: Diagnosis not present

## 2023-09-18 DIAGNOSIS — M25461 Effusion, right knee: Secondary | ICD-10-CM | POA: Diagnosis not present

## 2023-09-18 DIAGNOSIS — Z5321 Procedure and treatment not carried out due to patient leaving prior to being seen by health care provider: Secondary | ICD-10-CM | POA: Diagnosis not present

## 2023-09-18 DIAGNOSIS — Y92002 Bathroom of unspecified non-institutional (private) residence single-family (private) house as the place of occurrence of the external cause: Secondary | ICD-10-CM | POA: Diagnosis not present

## 2023-09-18 DIAGNOSIS — S82401A Unspecified fracture of shaft of right fibula, initial encounter for closed fracture: Secondary | ICD-10-CM | POA: Diagnosis not present

## 2023-09-18 DIAGNOSIS — S82301D Unspecified fracture of lower end of right tibia, subsequent encounter for closed fracture with routine healing: Secondary | ICD-10-CM | POA: Diagnosis not present

## 2023-09-18 LAB — HCG, SERUM, QUALITATIVE: Preg, Serum: NEGATIVE

## 2023-09-18 MED ORDER — OXYCODONE-ACETAMINOPHEN 5-325 MG PO TABS
1.0000 | ORAL_TABLET | Freq: Once | ORAL | Status: AC
  Administered 2023-09-18: 1 via ORAL
  Filled 2023-09-18: qty 1

## 2023-09-18 NOTE — ED Triage Notes (Signed)
Pt got up this morning to go to bathroom with walker and the walker slid and made her fall on her right knee that she recently had surgery on. Pt unable to walk on her leg due to the pain

## 2023-09-18 NOTE — ED Notes (Signed)
Pt called several times to go to bed in the back, no response.

## 2023-09-18 NOTE — ED Provider Triage Note (Signed)
Emergency Medicine Provider Triage Evaluation Note  Pamela Jefferson , a 27 y.o. female  was evaluated in triage.  Pt complains of R leg pain after falling today. Surgery by Dr. Christell Constant on 12/21, for intramedullary nail of tibia. Fell directly on surgery site. Mechanical fall.   Review of Systems  Positive: Leg pain Negative: fevers  Physical Exam  BP (!) 149/94 (BP Location: Right Arm)   Pulse (!) 111   Temp 98.7 F (37.1 C)   Resp 14   Ht 5\' 8"  (1.727 m)   Wt 100.7 kg   LMP 08/25/2023   SpO2 100%   BMI 33.76 kg/m  Gen:   Awake, no distress   Resp:  Normal effort  MSK:   Moves extremities without difficulty  Other:  +surgical dressing on R upper leg w/ttp of distal femur and proximal tibia  Medical Decision Making  Medically screening exam initiated at 4:41 PM.  Appropriate orders placed.  Pamela Jefferson was informed that the remainder of the evaluation will be completed by another provider, this initial triage assessment does not replace that evaluation, and the importance of remaining in the ED until their evaluation is complete.    Pamela Jefferson, [place] 09/18/23 0981

## 2023-09-25 ENCOUNTER — Ambulatory Visit (INDEPENDENT_AMBULATORY_CARE_PROVIDER_SITE_OTHER): Payer: Medicaid Other | Admitting: Orthopedic Surgery

## 2023-09-25 ENCOUNTER — Other Ambulatory Visit (INDEPENDENT_AMBULATORY_CARE_PROVIDER_SITE_OTHER): Payer: Medicaid Other

## 2023-09-25 DIAGNOSIS — S82202A Unspecified fracture of shaft of left tibia, initial encounter for closed fracture: Secondary | ICD-10-CM

## 2023-09-25 DIAGNOSIS — S82402A Unspecified fracture of shaft of left fibula, initial encounter for closed fracture: Secondary | ICD-10-CM

## 2023-09-25 MED ORDER — OXYCODONE HCL 5 MG PO TABS: 5.0000 mg | ORAL_TABLET | ORAL | 0 refills | Status: DC | PRN

## 2023-09-25 NOTE — Progress Notes (Signed)
 Orthopedic Surgery Post-operative Office Visit  Procedure: right tibia intramedullary rodding  Date of Surgery: 09/09/2023 (~2 weeks post-op)  Assessment: Patient is a 28 y.o. who is doing as expected after surgery   Plan: -Operative plans complete -Staples removed in office today -WBAT RLE -Okay to let soap/water run over incision but do not submerge -Pain management: weaning oxycodone  -Return to office in 4 weeks, x-rays needed at next visit: right tibia AP and lateral  ___________________________________________________________________________   Subjective: Patient has been doing well since surgery.  She has been at home.  She has been ambulating with a walker.  She says that pain has been getting better with time.  She has not noticed any redness or drainage around her incisions.  Objective:  General: no acute distress, appropriate affect Neurologic: alert, answering questions appropriately, following commands Respiratory: unlabored breathing on room air Skin: incision are well-approximated with no erythema, induration, active/expressible drainage  MSK (RLE): EHL/TA/GSC intact, sensation intact to light touch in sural/saphenous/deep peroneal/superficial peroneal/tibial nerve distributions, foot warm and well-perfused, ambulates with walker  Imaging: X-rays of the tibia taken 09/25/2023 were independently reviewed and interpreted, showing intramedullary rod in the tibia.  There are 3 interlocking screws proximally and 2 distally.  No lucency seen around the screws.  Fracture appears well-approximated at the distal third of the tibia.  No change in alignment since immediate postoperative films.  No new fracture seen.   Patient name: Pamela Jefferson Patient MRN: [id number] Date of visit: 09/25/23

## 2023-09-29 ENCOUNTER — Telehealth: Payer: Self-pay | Admitting: Orthopedic Surgery

## 2023-09-29 NOTE — Telephone Encounter (Signed)
 Pt would like Oxycodone refill sent to [facility] on file please advise

## 2023-09-29 NOTE — Telephone Encounter (Signed)
 I tried to call to advise, "call could not be completed at this time"

## 2023-10-02 ENCOUNTER — Telehealth: Payer: Self-pay | Admitting: Orthopedic Surgery

## 2023-10-02 MED ORDER — OXYCODONE HCL 5 MG PO TABS: 5.0000 mg | ORAL_TABLET | ORAL | 0 refills | Status: AC | PRN

## 2023-10-02 NOTE — Telephone Encounter (Signed)
 Patient called needing Rx refilled Oxycodone. The number to contact patient is [phone]

## 2023-10-02 NOTE — Telephone Encounter (Signed)
 Pt called back in today

## 2023-10-03 ENCOUNTER — Other Ambulatory Visit: Payer: Self-pay | Admitting: Orthopedic Surgery

## 2023-10-13 ENCOUNTER — Telehealth: Payer: Self-pay | Admitting: Orthopedic Surgery

## 2023-10-13 ENCOUNTER — Encounter: Payer: Self-pay | Admitting: Orthopedic Surgery

## 2023-10-13 MED ORDER — HYDROCODONE-ACETAMINOPHEN 5-325 MG PO TABS: 1.0000 | ORAL_TABLET | ORAL | 0 refills | Status: AC | PRN

## 2023-10-13 NOTE — Telephone Encounter (Signed)
Pt called requesting a letter for her employer to return to work Monday 1/27 with light duty Please send to her mychart. Pt also need refill of oxycodone [facility] [place]. Pt phone number is [phone]

## 2023-10-20 ENCOUNTER — Other Ambulatory Visit: Payer: Self-pay | Admitting: Orthopedic Surgery

## 2023-10-23 ENCOUNTER — Ambulatory Visit (INDEPENDENT_AMBULATORY_CARE_PROVIDER_SITE_OTHER): Payer: Medicaid Other | Admitting: Orthopedic Surgery

## 2023-10-23 ENCOUNTER — Other Ambulatory Visit (INDEPENDENT_AMBULATORY_CARE_PROVIDER_SITE_OTHER): Payer: Medicaid Other

## 2023-10-23 DIAGNOSIS — S82402A Unspecified fracture of shaft of left fibula, initial encounter for closed fracture: Secondary | ICD-10-CM | POA: Diagnosis not present

## 2023-10-23 DIAGNOSIS — S82202A Unspecified fracture of shaft of left tibia, initial encounter for closed fracture: Secondary | ICD-10-CM

## 2023-10-23 DIAGNOSIS — S82221D Displaced transverse fracture of shaft of right tibia, subsequent encounter for closed fracture with routine healing: Secondary | ICD-10-CM

## 2023-10-23 MED ORDER — HYDROCODONE-ACETAMINOPHEN 5-325 MG PO TABS: 1.0000 | ORAL_TABLET | ORAL | 0 refills | Status: DC | PRN

## 2023-10-23 NOTE — Progress Notes (Signed)
Orthopedic Surgery Post-operative Office Visit   Procedure: right tibia intramedullary rodding  Date of Surgery: 09/09/2023 (~6 weeks post-op)   Assessment: Patient is a 28 y.o. who is having some anterior knee pain and distal tibia pain. Pain is improving. 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 She is ambulating without assistive devices. 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 She said her distal tibia is often sore at the end of these days. If she is not at work, she is not having any distal tibia pain. She has some anterior knee pain that she notices were deep bending mostly. She also is reporting some weakness with straightening her leg. Overall, she feels that her pain has been improving and she has been able to do a lot more since she was last seen.    Objective:   General: no acute distress, appropriate affect, ambulates without assistive devices Neurologic: alert, answering questions appropriately, following commands Respiratory: unlabored breathing on room air Skin: incision are well healed with no erythema, induration, active/expressible drainage   MSK (RLE): extensor mechanism intact, 4/5 strength with knee extension, EHL/TA/GSC intact, sensation intact to light touch in sural/saphenous/deep peroneal/superficial peroneal/tibial nerve distributions, foot warm and well-perfused   Imaging: X-rays of the tibia taken 10/23/2023 were independently reviewed and interpreted, showing intramedullary rod in the tibia.   There are 3 interlocking screws proximally and 2 distally.  Subtle lucency seen around the middle proximal interlocking screw only seen on the lateral. No other lucency seen around the screws. Fracture appears well-approximated at the distal third of the tibia.  No change in alignment since immediate postoperative films.  No new fracture seen.     Patient name: Pamela Jefferson Patient MRN: [id number] Date of visit: 10/23/23

## 2023-10-30 ENCOUNTER — Telehealth: Payer: Self-pay | Admitting: Orthopedic Surgery

## 2023-10-30 MED ORDER — HYDROCODONE-ACETAMINOPHEN 5-325 MG PO TABS: 1.0000 | ORAL_TABLET | Freq: Four times a day (QID) | ORAL | 0 refills | Status: AC | PRN

## 2023-10-30 NOTE — Telephone Encounter (Signed)
 Rx refill Hydrocodone    Pharmacy [facility]    Pt wants to know when she will start physical therapy

## 2023-10-31 NOTE — Therapy (Deleted)
 OUTPATIENT PHYSICAL THERAPY EVALUATION   Patient Name: Pamela Jefferson MRN: [id number] DOB:1996-05-10, 28 y.o., female Today's Date: 10/31/2023  END OF SESSION:   Past Medical History:  Diagnosis Date   Anemia    Anxiety    Chlamydia    Depression    "about ready to give up:", no help, just tired   Gonorrhea    Hx of gastric ulcer    Past Surgical History:  Procedure Laterality Date   FRACTURE SURGERY     left arm   TIBIA IM NAIL INSERTION Right 09/09/2023   Procedure: INTRAMEDULLARY (IM) NAIL TIBIAL;  Surgeon: London Sheer, MD;  Location: [facility] OR;  Service: Orthopedics;  Laterality: Right;   Patient Active Problem List   Diagnosis Date Noted   Fracture of shaft of left tibia and fibula, closed, initial encounter 09/09/2023   Acute blood loss anemia 08/14/2022   SVD (spontaneous vaginal delivery) 08/13/2022   Normal postpartum course 08/13/2022   Encounter for elective induction of labor 09/02/2021   Depression     PCP: ***  REFERRING PROVIDER: London Sheer, MD  REFERRING DIAG: closed fracture of shaft of left tibia and fibula  THERAPY DIAG:  No diagnosis found.  Rationale for Evaluation and Treatment: Rehabilitation  ONSET DATE: date of injury 09/08/2023, date of surgery 09/09/2023   SUBJECTIVE:   SUBJECTIVE STATEMENT:   From Dr. Kathi Der note on 10/23/2023:  Orthopedic Surgery Post-operative Office Visit   Procedure: right tibia intramedullary rodding  Date of Surgery: 09/09/2023 (~6 weeks post-op)   Assessment: Patient is a 28 y.o. who is having some anterior knee pain and distal tibia pain. Pain is improving. 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 female who presents to outpatient physical therapy with a referral for medical diagnosis closed fracture of shaft of left tibia and fibula. This patient's chief complaints consist of ***, leading to the following functional deficits: ***. Relevant past medical history and comorbidities include ***.  Patient denies hx of {redflags:27294}   PAIN: Are you having pain? Yes NPRS: Current: ***/10,  Best: ***/10, Worst: ***/10. Pain location: *** Pain description: *** Aggravating factors: *** Relieving factors: ***   FUNCTIONAL LIMITATIONS: ***  LEISURE: ***  PRECAUTIONS: {Therapy precautions:24002}  WEIGHT BEARING RESTRICTIONS: {Yes ***/No:24003}  FALLS:  Has patient fallen in last 6 months? {fallsyesno:27318}  LIVING ENVIRONMENT: Lives with: {[facility] lives with:25569::"lives with their family"} Lives in: {Lives in:25570} Stairs: {opstairs:27293} Has following equipment at home: {Assistive devices:23999}  OCCUPATION: ***  PLOF: {PLOF:24004}  PATIENT GOALS: ***  NEXT MD VISIT: ***   OBJECTIVE  DIAGNOSTIC FINDINGS:  Per Dr. Kathi Der note on 10/23/2023:  X-rays of the tibia taken 10/23/2023 were independently reviewed and interpreted, showing intramedullary rod in the tibia.  There are 3 interlocking screws proximally and 2 distally.  Subtle lucency seen around the middle proximal interlocking screw only seen on the lateral. No other lucency seen around the screws. Fracture appears well-approximated at the distal third of the tibia.  No change in alignment since immediate postoperative films.  No new fracture seen.   SELF-REPORTED FUNCTION Lower Extremity Functional Scale (LEFS): *** (range 0-80), ***%  OBSERVATION/INSPECTION Posture Posture (  seated): forward head, rounded shoulders, slumped in sitting.  Posture (standing): *** Posture correction: *** Anthropometrics Tremor: none Body composition: *** Muscle bulk: *** Skin: The incision sites  appear to be healing well with no excessive redness, warmth, drainage or signs of infection present.  *** Edema: *** Functional Mobility Bed mobility: *** Transfers: *** Gait: grossly WFL for household and short community ambulation. More detailed gait analysis deferred to later date as needed. *** Stairs: ***  SPINE MOTION  LUMBAR SPINE AROM *Indicates pain Flexion: *** Extension: *** Side Flexion:   R ***  L *** Rotation:  R *** L *** Side glide:  R *** L ***    NEUROLOGICAL  Upper Motor Neuron Screen Babinski, Hoffman's and Clonus (ankle) negative bilaterally.  Dermatomes C2-T1 appears equal and intact to light touch except the following: *** L2-S2 appears equal and intact to light touch except the following: *** Deep Tendon Reflexes R/L  ***+/***+ Biceps brachii reflex (C5, C6) ***+/***+ Brachioradialis reflex (C6) ***+/***+ Triceps brachii reflex (C7) ***+/***+ Quadriceps reflex (L4) ***+/***+ Achilles reflex (S1)  SPINE MOTION  CERVICAL SPINE AROM *Indicates pain Flexion: *** Extension: *** Side Flexion:   R ***  L *** Rotation:  R *** L *** Protraction: *** Retrusion: ***   PERIPHERAL JOINT MOTION (in degrees)  ACTIVE RANGE OF MOTION (AROM) *Indicates pain Date Date Date  Joint/Motion R/L R/L R/L  Shoulder     Flexion / / /  Extension / / /  Abduction  / / /  External rotation / / /  Internal rotation / / /  Elbow     Flexion  / / /  Extension  / / /  Wrist     Flexion / / /  Extension  / / /  Radial deviation / / /  Ulnar deviation / / /  Pronation / / /  Supination / / /  Hip     Flexion / / /  Extension  / / /  Abduction / / /  Adduction / / /  External rotation / / /  Internal rotation  / / /  Knee     Extension / / /  Flexoin / / /  Ankle/Foot     Dorsiflexion (knee ext) / / /  Dorsiflexion (knee flex) / / /  Plantarflexion / / /  Everison / / /  Inversion / / /  Great toe extension / / /  Great toe flexion / /  /  Comments:   PASSIVE RANGE OF MOTION (PROM) *Indicates pain Date Date Date  Joint/Motion R/L R/L R/L  Shoulder     Flexion / / /  Extension / / /  Abduction  / / /  External rotation / / /  Internal rotation / / /  Elbow     Flexion  / / /  Extension  / / /  Wrist     Flexion / / /  Extension  / / /  Radial deviation / / /  Ulnar deviation / / /  Pronation / / /  Supination / / /  Hip     Flexion  / / /  Extension  / / /  Abduction / / /  Adduction / / /  External rotation / / /  Internal rotation  / / /  Knee     Extension / / /  Flexion / / /  Ankle/Foot     Dorsiflexion (knee ext) / / /  Dorsiflexion (knee flex) / / /  Plantarflexion / / /  Everison / / /  Inversion / / /  Great toe extension / / /  Great toe flexion / / /  Comments:   MUSCLE PERFORMANCE (MMT):  *Indicates pain Date Date Date  Joint/Motion R/L R/L R/L  Shoulder     Flexion / / /  Abduction (C5) / / /  External rotation / / /  Internal rotation / / /  Extension / / /  Elbow     Flexion (C6) / / /  Extension (C7) / / /  Wrist     Flexion (C7) / / /  Extension (C6) / / /  Radial deviation / / /  Ulnar deviation (C8) / / /  Pronation / / /  Supination / / /  Hand     Thumb extension (C8) / / /  Finger abduction (T1) / / /  Grip (C8) / / /  Hip     Flexion (L1, L2) / / /  Extension (knee ext) / / /  Extension (knee flex) / / /  Abduction / / /  Adduction / / /  External rotation / / /  Internal rotation  / / /  Knee     Extension (L3) / / /  Flexion (S2) / / /  Ankle/Foot     Dorsiflexion (L4) / / /  Great toe extension (L5) / / /  Eversion (S1) / / /  Plantarflexion (S1) / / /  Inversion / / /  Pronation / / /  Great toe flexion / / /  Comments:   SPECIAL TESTS:  .Neurodynamictests .NeurodynamicUE .NeurodynamicLE .CspineInstability .CSPINESPECIALTESTS .SHOULDERSPECIALTESTCLUSTERS .HIPSPECIALTESTS .SIJSPECIALTESTS   SHOULDER SPECIAL TESTS RTC,  Impingement, Anterior Instability (macrotrauma), Labral Tear: Painful arc test: R = ***, L = ***. Drop arm test: R = ***, L = ***. Hawkins-Kennedy test: R = ***, L = ***. Infraspinatus test: R = ***, L = ***. Apprehension test: R = ***, L = ***. Relocation test: R = ***, L = ***. Active compression test: R = ***, L = ***.  ACCESSORY MOTION: ***  PALPATION: ***  SUSTAINED POSITIONS TESTING:  ***  REPEATED MOTIONS TESTING: ***  FUNCTIONAL/BALANCE TESTS: Five Time Sit to Stand (5TSTS): *** seconds Functional Gait Assessment (FGA): ***/30 (see details above) Ten meter walking trial ( ): *** m/s Six Minute Walk Test ( ): *** feet Timed Up and Go (TUG): *** seconds   Dynamic Gait Index: ***/24 BERG Balance Scale: ***/56 Tinetti/POMA: ***/28 Timed Up and GO: *** seconds (average of 3 trials) Trial 1: *** Trial 2: *** Trial 3: *** Romberg test: -Narrow stance, eyes open: *** seconds -Narrow stance, eyes closed: *** seconds Sharpened Romberg test: -Tandem stance, eyes open: *** seconds -Tandem stance, eyes closed: *** seconds  Narrow stance, firm surface, eyes open: *** seconds Narrow stance, firm surface, eyes closed: *** seconds Narrow stance, compliant surface, eyes open: *** seconds Narrow stance, compliant surface, eyes closed: *** seconds Single leg stance, firm surface, eyes open: R= *** seconds, L= *** seconds Single leg stance, compliant surface, eyes open: R= *** seconds, L= *** seconds Gait speed: *** m/s Functional reach test: *** inches  TREATMENT:   PATIENT EDUCATION:  Education details: *** Person educated: {Person educated:25204} Education method: {Education Method:25205} Education comprehension: {Education Comprehension:25206}  HOME EXERCISE PROGRAM: ***  ASSESSMENT:  CLINICAL IMPRESSION: Patient is a 28 y.o.  female referred to outpatient physical therapy with a medical diagnosis of closed fracture of shaft of left tibia and fibula who presents with signs and symptoms consistent with ***. Patient presents with significant *** impairments that are limiting ability to complete *** without difficulty. Patient will benefit from skilled physical therapy intervention to address current body structure impairments and activity limitations to improve function and work towards goals set in current POC in order to return to prior level of function or maximal functional improvement.    OBJECTIVE IMPAIRMENTS: {opptimpairments:25111}.   ACTIVITY LIMITATIONS: {activitylimitations:27494}  PARTICIPATION LIMITATIONS: {participationrestrictions:25113}  PERSONAL FACTORS: {Personal factors:25162} are also affecting patient's functional outcome.   REHAB POTENTIAL: {rehabpotential:25112}  CLINICAL DECISION MAKING: {clinical decision making:25114}  EVALUATION COMPLEXITY: {Evaluation complexity:25115}   GOALS: Goals reviewed with patient? {yes/no:20286}  SHORT TERM GOALS: Target date: 11/14/2023  Patient will be independent with initial home exercise program for self-management of symptoms. Baseline: {HEPbaseline4:27310} (10/31/23); Goal status: INITIAL  Patient will demonstrate improvement in Patient Specific Functional Scale (PSFS) of equal or greater than 3 points to reflect clinically significant improvement in patient's most valued functional activities.. Baseline: {Sarasgoalbaseline:32234} (10/31/23); Goal status: INITIAL  2. Patient will demonstrate improvement in Lower Extremity Functional Scale (LEFS) by equal or greater than 9 points (MCID) to reflect clinically significant improvement in overall condition and self-reported functional ability. Baseline: {Sarasgoalbaseline:32234} (10/31/23); Goal status: INITIAL  3. Patient will report improvement in NPRS of equal or greater than 2 points during  functional activities to improve their abilitly to complete community, work and/or recreational activities with less limitation. Baseline: ***/10 (10/31/23); Goal status: INITIAL   LONG TERM GOALS: Target date: 01/23/2024  Patient will be independent with a long-term home exercise program for self-management of symptoms.  Baseline: {HEPbaseline4:27310} (10/31/23); Goal status: INITIAL  2.  Patient will demonstrate improved Lower Extremity Functional Scale (LEFS) to equal or greater than 64/80 to demonstrate improvement in overall condition and self-reported functional ability.  Baseline: {Sarasgoalbaseline:32234} (10/31/23); Goal status: INITIAL  3.  *** Baseline: {Sarasgoalbaseline:32234} (10/31/23); Goal status: INITIAL  4.  *** Baseline: {Sarasgoalbaseline:32234} (10/31/23); Goal status: INITIAL  5.  Patient will demonstrate improvement in Patient Specific Functional Scale (PSFS) of equal or greater than 8/10 points to reflect clinically significant improvement in patient's most valued functional activities. Baseline: {Sarasgoalbaseline:32234} (10/31/23); Goal status: INITIAL  5.  Patient will report NPRS equal or less than 3/10 during functional activities during the last 2 weeks to improve their abilitly to complete community, work and/or recreational activities with less limitation. Baseline: ***/10 (10/31/23); Goal status: INITIAL    PLAN:  PT FREQUENCY: {rehab frequency:25116}  PT DURATION: {rehab duration:25117}  PLANNED INTERVENTIONS: {rehab planned interventions:25118::"97110-Therapeutic exercises","97530- Therapeutic [phone]- Neuromuscular re-education","97535- Self WGNF","62130- Manual therapy"}  PLAN FOR NEXT SESSION: ***   Zyona Pettaway R. Ilsa Iha, PT, DPT 10/31/23, 1:41 PM  [facility] [address] [place], [place] [address] P: [phone] I F: [phone]

## 2023-10-31 NOTE — Therapy (Deleted)
 OUTPATIENT PHYSICAL THERAPY EVALUATION   Patient Name: Pamela Jefferson MRN: [id number] DOB:1996-04-04, 28 y.o., female Today's Date: 10/31/2023  END OF SESSION:   Past Medical History:  Diagnosis Date   Anemia    Anxiety    Chlamydia    Depression    "about ready to give up:", no help, just tired   Gonorrhea    Hx of gastric ulcer    Past Surgical History:  Procedure Laterality Date   FRACTURE SURGERY     left arm   TIBIA IM NAIL INSERTION Right 09/09/2023   Procedure: INTRAMEDULLARY (IM) NAIL TIBIAL;  Surgeon: London Sheer, MD;  Location: [facility] OR;  Service: Orthopedics;  Laterality: Right;   Patient Active Problem List   Diagnosis Date Noted   Fracture of shaft of left tibia and fibula, closed, initial encounter 09/09/2023   Acute blood loss anemia 08/14/2022   SVD (spontaneous vaginal delivery) 08/13/2022   Normal postpartum course 08/13/2022   Encounter for elective induction of labor 09/02/2021   Depression     PCP: No PCP  REFERRING PROVIDER: London Sheer, MD  REFERRING DIAG: Closed displaced transverse fracture of shaft of right tibia with routine healing, subsequent encounter   Rationale for Evaluation and Treatment: Rehabilitation  THERAPY DIAG:  No diagnosis found.  ONSET DATE: ***  SUBJECTIVE:                                                                                                                                                                                           SUBJECTIVE STATEMENT: ***  From Dr. Kathi Der Note on 10/23/23: Orthopedic Surgery Post-operative Office Visit   Procedure: right tibia intramedullary rodding  Date of Surgery: 09/09/2023 (~6 weeks post-op)   Assessment: Patient is a 28 y.o. who is having some anterior knee pain and distal tibia pain. Pain is improving. 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   PERTINENT HISTORY:  Patient is a 28 y.o. female who presents to outpatient physical therapy with a referral for medical diagnosis closed displaced transverse fracture of shaft of right tibia with routine healing. This patient's chief complaints consist of ***, leading to the following functional deficits: ***. Relevant past medical history and comorbidities  include ***.     PAIN:  Are you having pain? Yes NPRS: Current: ***/10,  Best: ***/10, Worst: ***/10. Pain location: *** Pain description: *** Aggravating factors: *** Relieving factors: ***  Irritability: ***   FUNCTIONAL LIMITATIONS: ***  LEISURE: ***   PRECAUTIONS: {Therapy precautions:24002}  RED FLAGS: Patient denies hx of {redflags:27294}   WEIGHT BEARING RESTRICTIONS:  WBAT RLE (per progress note by Dr. Christell Constant on 10/23/23)  FALLS:  Has patient fallen in last 6 months? {fallsyesno:27318}  LIVING ENVIRONMENT: Lives with: {[facility] lives with:25569::"lives with their family"} Lives in: {Lives in:25570} Stairs: {opstairs:27293} Has following equipment at home: {Assistive devices:23999}  OCCUPATION: Works at [employer]  PLOF: {PLOF:24004}  PATIENT GOALS: ***  NEXT MD VISIT: ***  OBJECTIVE:  OBJECTIVE  DIAGNOSTIC FINDINGS:  Per Dr. Kathi Der progress note on 10/23/2023:  X-rays of the tibia taken 10/23/2023 were independently reviewed and interpreted, showing intramedullary rod in the tibia.  There are 3 interlocking screws proximally and 2 distally.  Subtle lucency seen around the middle proximal interlocking screw only seen on the lateral. No other lucency seen around the screws. Fracture appears well-approximated at the distal third of the tibia.  No  change in alignment since immediate postoperative films.  No new fracture seen.   SELF-REPORTED FUNCTION Lower Extremity Functional Scale (LEFS): *** (range 0-80), ***%  OBSERVATION/INSPECTION Posture Posture (seated): forward head, rounded shoulders, slumped in sitting.  Posture (standing): *** Posture correction: *** Anthropometrics Tremor: none Body composition: *** Muscle bulk: *** Skin: The incision sites appear to be healing well with no excessive redness, warmth, drainage or signs of infection present.  *** Edema: *** Functional Mobility Bed mobility: *** Transfers: *** Gait: grossly WFL for household and short community ambulation. More detailed gait analysis deferred to later date as needed. *** Stairs: ***  SPINE MOTION  LUMBAR SPINE AROM *Indicates pain Flexion: *** Extension: *** Side Flexion:   R ***  L *** Rotation:  R *** L *** Side glide:  R *** L ***   NEUROLOGICAL  Upper Motor Neuron Screen Babinski, Hoffman's and Clonus (ankle) negative bilaterally.  Dermatomes L2-S2 appears equal and intact to light touch except the following: *** Deep Tendon Reflexes R/L  ***+/***+ Quadriceps reflex (L4) ***+/***+ Achilles reflex (S1)   PERIPHERAL JOINT MOTION (in degrees)  ACTIVE RANGE OF MOTION (AROM) *Indicates pain 11/01/23 Date Date  Joint/Motion R/L R/L R/L  Hip     Flexion / / /  Extension  / / /  Abduction / / /  Adduction / / /  External rotation / / /  Internal rotation  / / /  Knee     Extension / / /  Flexoin / / /  Ankle/Foot     Dorsiflexion (knee ext) / / /  Dorsiflexion (knee flex) / / /  Plantarflexion / / /  Everison / / /  Inversion / / /  Great toe extension / / /  Great toe flexion / / /  Comments:   PASSIVE RANGE OF MOTION (PROM) *Indicates pain 11/01/23 Date Date  Joint/Motion R/L R/L R/L  Hip     Flexion  / / /  Extension  / / /  Abduction / / /  Adduction / / /  External rotation / / /  Internal rotation   / / /  Knee     Extension / / /  Flexion / / /  Ankle/Foot     Dorsiflexion (knee ext) / / /  Dorsiflexion (  knee flex) / / /  Plantarflexion / / /  Everison / / /  Inversion / / /  Great toe extension / / /  Great toe flexion / / /  Comments:   MUSCLE PERFORMANCE (MMT):  *Indicates pain 11/01/23 Date Date  Joint/Motion R/L R/L R/L  Hip     Flexion (L1, L2) / / /  Extension (knee ext) / / /  Extension (knee flex) / / /  Abduction / / /  Adduction / / /  External rotation / / /  Internal rotation  / / /  Knee     Extension (L3) / / /  Flexion (S2) / / /  Ankle/Foot     Dorsiflexion (L4) / / /  Great toe extension (L5) / / /  Eversion (S1) / / /  Plantarflexion (S1) / / /  Inversion / / /  Pronation / / /  Great toe flexion / / /  Comments:   SPECIAL TESTS:  .Neurodynamictests .NeurodynamicUE .NeurodynamicLE .CspineInstability .CSPINESPECIALTESTS .SHOULDERSPECIALTESTCLUSTERS .HIPSPECIALTESTS .SIJSPECIALTESTS   ACCESSORY MOTION: ***  PALPATION: ***  SUSTAINED POSITIONS TESTING:  ***  REPEATED MOTIONS TESTING: ***  FUNCTIONAL/BALANCE TESTS: Five Time Sit to Stand (5TSTS): *** seconds Functional Gait Assessment (FGA): ***/30 (see details above) Ten meter walking trial ( ): *** m/s Six Minute Walk Test ( ): *** feet Timed Up and Go (TUG): *** seconds   Dynamic Gait Index: ***/24 BERG Balance Scale: ***/56 Tinetti/POMA: ***/28 Timed Up and GO: *** seconds (average of 3 trials) Trial 1: *** Trial 2: *** Trial 3: *** Romberg test: -Narrow stance, eyes open: *** seconds -Narrow stance, eyes closed: *** seconds Sharpened Romberg test: -Tandem stance, eyes open: *** seconds -Tandem stance, eyes closed: *** seconds       TREATMENT:                                                                                                                             PATIENT EDUCATION:  Education details: *** Person educated: {Person  educated:25204} Education method: {Education Method:25205} Education comprehension: {Education Comprehension:25206}  HOME EXERCISE PROGRAM: ***  ASSESSMENT:  CLINICAL IMPRESSION: Patient is a *** y.o. *** who was seen today for physical therapy evaluation and treatment for ***.   OBJECTIVE IMPAIRMENTS: {opptimpairments:25111}.   ACTIVITY LIMITATIONS: {activitylimitations:27494}  PARTICIPATION LIMITATIONS: {participationrestrictions:25113}  PERSONAL FACTORS: {Personal factors:25162} are also affecting patient's functional outcome.   REHAB POTENTIAL: {rehabpotential:25112}  CLINICAL DECISION MAKING: {clinical decision making:25114}  EVALUATION COMPLEXITY: {Evaluation complexity:25115}   GOALS: Goals reviewed with patient? {yes/no:20286}  SHORT TERM GOALS: Target date: 11/14/2023  Patient will be independent with initial home exercise program for self-management of symptoms. Baseline: {HEPbaseline4:27310} (10/31/23); Goal status: INITIAL  Patient will demonstrate improvement in Patient Specific Functional Scale (PSFS) of equal or greater than 3 points to reflect clinically significant improvement in patient's most valued functional activities.. Baseline: {Sarasgoalbaseline:32234} (10/31/23); Goal status: INITIAL  2. Patient will demonstrate improvement in {SarasSTGPRO:32232} to reflect  clinically significant improvement in overall condition and self-reported functional ability. Baseline: {Sarasgoalbaseline:32234} (10/31/23); Goal status: INITIAL  3. Patient will report improvement in NPRS of equal or greater than 2 points during functional activities to improve their abilitly to complete community, work and/or recreational activities with less limitation. Baseline: ***/10 (10/31/23); Goal status: INITIAL   LONG TERM GOALS: Target date: 01/23/2024  Patient will be independent with a long-term home exercise program for self-management of symptoms.  Baseline:  {HEPbaseline4:27310} (10/31/23); Goal status: INITIAL  2.  Patient will demonstrate improved {SarasLTGPRO:32233} to demonstrate improvement in overall condition and self-reported functional ability.  Baseline: {Sarasgoalbaseline:32234} (10/31/23); Goal status: INITIAL  3.  *** Baseline: {Sarasgoalbaseline:32234} (10/31/23); Goal status: INITIAL  4.  *** Baseline: {Sarasgoalbaseline:32234} (10/31/23); Goal status: INITIAL  5.  Patient will demonstrate improvement in Patient Specific Functional Scale (PSFS) of equal or greater than 8/10 points to reflect clinically significant improvement in patient's most valued functional activities. Baseline: {Sarasgoalbaseline:32234} (10/31/23); Goal status: INITIAL  5.  Patient will report NPRS equal or less than 3/10 during functional activities during the last 2 weeks to improve their abilitly to complete community, work and/or recreational activities with less limitation. Baseline: ***/10 (10/31/23); Goal status: INITIAL   PLAN:  PT FREQUENCY: {rehab frequency:25116}  PT DURATION: {rehab duration:25117}  PLANNED INTERVENTIONS: {rehab planned interventions:25118::"97110-Therapeutic exercises","97530- Therapeutic [phone]- Neuromuscular re-education","97535- Self JXBJ","47829- Manual therapy"}.  PLAN FOR NEXT SESSION: ***   Colene Mines [place], SPT [employer] [facility]

## 2023-11-01 ENCOUNTER — Ambulatory Visit: Payer: Medicaid Other | Attending: Orthopedic Surgery | Admitting: Physical Therapy

## 2023-11-01 ENCOUNTER — Telehealth: Payer: Self-pay | Admitting: Physical Therapy

## 2023-11-01 NOTE — Telephone Encounter (Signed)
Left message notifying patient of missed PT visit scheduled at 9 am today. Requested she call back to reschedule, confirm next appointment, or let us know of any changes in PT plans.   Let her know there are two open appointment times this afternoon at 4:45pm and 6:15pm if either of those work for her schedule.   Let patient know that with any no-show I am required to review our cancellation policy that after 2 no-shows we cannot schedule more than 1 week at a time and/or we may remove a patient from the schedule.   Alyona Romack [place], SPT [facility]   [facility] [address] [place], [place] [address] P: [phone] I F: [phone]

## 2023-11-02 ENCOUNTER — Telehealth: Payer: Self-pay | Admitting: Orthopedic Surgery

## 2023-11-02 ENCOUNTER — Other Ambulatory Visit: Payer: Self-pay | Admitting: Radiology

## 2023-11-02 DIAGNOSIS — S82221D Displaced transverse fracture of shaft of right tibia, subsequent encounter for closed fracture with routine healing: Secondary | ICD-10-CM

## 2023-11-02 DIAGNOSIS — S82402A Unspecified fracture of shaft of left fibula, initial encounter for closed fracture: Secondary | ICD-10-CM

## 2023-11-02 NOTE — Telephone Encounter (Signed)
New order for [facility] was placed

## 2023-11-02 NOTE — Telephone Encounter (Signed)
Patient states PT in [place] cancelled all her appointments due to a NS for her Eval first appt pt would like to get e new PT referral to another location.

## 2023-11-09 ENCOUNTER — Encounter: Payer: Medicaid Other | Admitting: Physical Therapy

## 2023-11-15 ENCOUNTER — Encounter: Payer: Medicaid Other | Admitting: Physical Therapy

## 2023-12-04 ENCOUNTER — Ambulatory Visit (INDEPENDENT_AMBULATORY_CARE_PROVIDER_SITE_OTHER): Payer: Medicaid Other | Admitting: Orthopedic Surgery

## 2023-12-04 ENCOUNTER — Other Ambulatory Visit (INDEPENDENT_AMBULATORY_CARE_PROVIDER_SITE_OTHER): Payer: Self-pay

## 2023-12-04 ENCOUNTER — Encounter: Payer: Self-pay | Admitting: Orthopedic Surgery

## 2023-12-04 DIAGNOSIS — S82402A Unspecified fracture of shaft of left fibula, initial encounter for closed fracture: Secondary | ICD-10-CM | POA: Diagnosis not present

## 2023-12-04 DIAGNOSIS — S82202A Unspecified fracture of shaft of left tibia, initial encounter for closed fracture: Secondary | ICD-10-CM

## 2023-12-04 DIAGNOSIS — S82221D Displaced transverse fracture of shaft of right tibia, subsequent encounter for closed fracture with routine healing: Secondary | ICD-10-CM

## 2023-12-04 NOTE — Progress Notes (Signed)
 Orthopedic Surgery Post-operative Office Visit   Procedure: right tibia intramedullary rodding  Date of Surgery: 09/09/2023 (~3 months post-op)   Assessment: Patient is a 28 y.o. who still has anterior knee pain particularly with extension but otherwise is doing well     Plan: -Operative plans complete -Encouraged her to continue to work on knee extension.  Told her to use Voltaren gel and ibuprofen while working on extension exercises.  Explained that her final surgical outcome will be at 1 year.  She has noticed significant improvement over time, so we will continue to monitor. I told her that the nail is just outside of the bone near the anterior knee so if she is still having pain once the fracture has healed, I could remove the nail -Return to office in 3 months, x-rays needed at next visit: right tibia AP and lateral   ___________________________________________________________________________     Subjective: Patient has continued to noticed improvement overall since she was last seen. She has some pain around the distal 1/3 of the tibia particularly after being on her feet all day at work, but that has gotten better with time. She is no longer taking any medication to control the pain. She has been able to go up and down stairs normally now which is a change. She still has anterior knee pain particularly when extending her knee all the way. That is the only consistent pain that she is noting at this time. She has not noticed any redness or drainage around her incisions.    Objective:   General: no acute distress, appropriate affect, ambulates without assistive devices Neurologic: alert, answering questions appropriately, following commands Respiratory: unlabored breathing on room air Skin: incision are well healed with no erythema, induration, active/expressible drainage   MSK (RLE): extensor mechanism intact, 4/5 strength with knee extension, able to active get her knee to 10  degrees shy of full extension before pain starts, full passive range of motion at the knee without pain, EHL/TA/GSC intact, sensation intact to light touch in sural/saphenous/deep peroneal/superficial peroneal/tibial nerve distributions, foot warm and well-perfused   Imaging: X-rays of the tibia taken 12/04/2023 were independently reviewed and interpreted, showing intramedullary rod in place. Fracture alignment maintained. Small amount of fracture callus seen. No lucency seen around any of the interlocking screws. No new fractures seen. Proximal aspect of the nail is no contained within bone.     Patient name: Pamela Jefferson Patient MRN: [id number] Date of visit: 12/04/23

## 2023-12-06 ENCOUNTER — Encounter: Payer: Medicaid Other | Admitting: Physical Therapy

## 2023-12-12 ENCOUNTER — Encounter: Payer: Medicaid Other | Admitting: Physical Therapy

## 2023-12-19 ENCOUNTER — Encounter: Payer: Medicaid Other | Admitting: Physical Therapy

## 2023-12-21 ENCOUNTER — Encounter: Payer: Medicaid Other | Admitting: Physical Therapy

## 2023-12-26 ENCOUNTER — Encounter: Payer: Medicaid Other | Admitting: Physical Therapy

## 2023-12-28 ENCOUNTER — Encounter: Payer: Medicaid Other | Admitting: Physical Therapy

## 2024-01-02 ENCOUNTER — Encounter: Payer: Medicaid Other | Admitting: Physical Therapy

## 2024-01-04 ENCOUNTER — Encounter: Payer: Medicaid Other | Admitting: Physical Therapy

## 2024-01-09 ENCOUNTER — Encounter: Payer: Medicaid Other | Admitting: Physical Therapy

## 2024-01-11 ENCOUNTER — Encounter: Payer: Medicaid Other | Admitting: Physical Therapy

## 2024-01-16 ENCOUNTER — Encounter: Payer: Medicaid Other | Admitting: Physical Therapy

## 2024-01-18 ENCOUNTER — Encounter: Payer: Medicaid Other | Admitting: Physical Therapy

## 2024-01-23 ENCOUNTER — Encounter: Payer: Medicaid Other | Admitting: Physical Therapy

## 2024-01-25 ENCOUNTER — Encounter: Payer: Medicaid Other | Admitting: Physical Therapy

## 2024-01-30 ENCOUNTER — Encounter: Payer: Medicaid Other | Admitting: Physical Therapy

## 2024-02-01 ENCOUNTER — Encounter: Payer: Medicaid Other | Admitting: Physical Therapy

## 2024-03-06 ENCOUNTER — Ambulatory Visit: Admitting: Orthopedic Surgery

## 2024-04-11 ENCOUNTER — Ambulatory Visit: Payer: Self-pay | Admitting: Orthopedic Surgery

## 2024-05-09 ENCOUNTER — Encounter: Payer: Self-pay | Admitting: Emergency Medicine

## 2024-05-09 ENCOUNTER — Other Ambulatory Visit: Payer: Self-pay

## 2024-05-09 ENCOUNTER — Emergency Department
Admission: EM | Admit: 2024-05-09 | Discharge: 2024-05-09 | Disposition: A | Discharge status: Home / Self Care | Payer: Self-pay | Attending: Emergency Medicine | Admitting: Emergency Medicine

## 2024-05-09 DIAGNOSIS — K0889 Other specified disorders of teeth and supporting structures: Secondary | ICD-10-CM

## 2024-05-09 DIAGNOSIS — K029 Dental caries, unspecified: Secondary | ICD-10-CM | POA: Insufficient documentation

## 2024-05-09 MED ORDER — HYDROCODONE-ACETAMINOPHEN 5-325 MG PO TABS: 1.0000 | ORAL_TABLET | Freq: Four times a day (QID) | ORAL | 0 refills | Status: DC | PRN

## 2024-05-09 MED ORDER — AMOXICILLIN-POT CLAVULANATE 875-125 MG PO TABS: 1.0000 | ORAL_TABLET | Freq: Two times a day (BID) | ORAL | 0 refills | Status: AC

## 2024-05-09 MED ORDER — HYDROCODONE-ACETAMINOPHEN 5-325 MG PO TABS
2.0000 | ORAL_TABLET | Freq: Once | ORAL | Status: AC
  Administered 2024-05-09: 2 via ORAL
  Filled 2024-05-09: qty 2

## 2024-05-09 MED ORDER — AMOXICILLIN-POT CLAVULANATE 875-125 MG PO TABS
1.0000 | ORAL_TABLET | Freq: Once | ORAL | Status: AC
  Administered 2024-05-09: 1 via ORAL
  Filled 2024-05-09: qty 1

## 2024-05-09 NOTE — ED Provider Notes (Signed)
 [facility] Provider Note    Event Date/Time   First MD Initiated Contact with Patient 05/09/24 1849     (approximate)   History   Dental Pain   HPI  Pamela Jefferson is a 28 y.o. female with history of previous tibial and fibular fracture, spontaneous vaginal delivery, and acute blood loss anemia [appears to be a distant diagnosis]  For 2 days patient has been having pain in the back of her right lower jaw.  She reports its from one of her teeth.  She has previously talked with her dentist about having a potential root canal or dental extractions with her dentist in [place].  Patient reports that she has been having a lot of pain to the point it has been hard for her to sleep last night due to pain in her right lower jaw.  She feels like her tooth is causing a lot of pain.  No fevers.  Maybe a tiny amount of swelling over the right lower face.  No difficulty breathing no neck pain.  No headache.  Denies pain in the face or sinuses.   Patient reports no major medical history aside from leg fracture.  Reports previous pregnancies.  Currently reports not pregnant.   Has previously used hydrocodone  with good success for orthopedic pain without complication.  Physical Exam   Triage Vital Signs: ED Triage Vitals  Encounter Vitals Group     BP 05/09/24 1702 130/76     Girls Systolic BP Percentile --      Girls Diastolic BP Percentile --      Boys Systolic BP Percentile --      Boys Diastolic BP Percentile --      Pulse Rate 05/09/24 1702 93     Resp 05/09/24 1702 18     Temp 05/09/24 1702 98.7 F (37.1 C)     Temp Source 05/09/24 1702 Oral     SpO2 05/09/24 1702 100 %     Weight 05/09/24 1700 205 lb (93 kg)     Height 05/09/24 1700 5' 8 (1.727 m)     Head Circumference --      Peak Flow --      Pain Score 05/09/24 1700 10     Pain Loc --      Pain Education --      Exclude from Growth Chart --     Most recent vital signs: Vitals:    05/09/24 1702 05/09/24 1946  BP: 130/76 130/76  Pulse: 93 92  Resp: 18 18  Temp: 98.7 F (37.1 C) 99.3 F (37.4 C)  SpO2: 100% 100%     General: Awake, no distress.  Normocephalic atraumatic anterior neck and face appear normal to inspection.  Oral pharynx is clear and patent posteriorly.  There is no lingular elevation there is no anterior neck discomfort or pain.  Normal respirations and no stridor  She does have somewhat poor dentition with occasional fracture teeth, but no tenderness or gum swelling or erythema with exception to the right posterior premolar where she has what appears to be fairly chronic fracture with approximately one fourth of the tooth remaining.  This tooth is tender to palpation.  There is no fistula or significant edema or swelling of the gingiva.  No necrosis.  She identifies as the area of pain.    CV:  Good peripheral perfusion.  Resp:  Normal effort.  Abd:  No distention.  Other:     ED Results /  Procedures / Treatments   Labs (all labs ordered are listed, but only abnormal results are displayed) Labs Reviewed - No data to display   EKG     RADIOLOGY     PROCEDURES:  Critical Care performed: No  Procedures   MEDICATIONS ORDERED IN ED: Medications  amoxicillin -clavulanate (AUGMENTIN ) 875-125 MG per tablet 1 tablet (1 tablet Oral Given 05/09/24 1947)  HYDROcodone -acetaminophen  (NORCO/VICODIN) 5-325 MG per tablet 2 tablet (2 tablets Oral Given 05/09/24 1947)     IMPRESSION / MDM / ASSESSMENT AND PLAN / ED COURSE  I reviewed the triage vital signs and the nursing notes.                              Differential diagnosis includes, but is not limited to, dental pain, possible dental carry, developing dental abscess, pain from chronic dental fracture etc.  Discussed with the patient she does have a dentist and [place] whom she can follow-up with, but just moved to this area interested in following with a provider here as well.   She is previously used hydrocodone  and it appears quite painful as dental pain can be.  She is agreeable to trial a short course of pain control with hydrocodone  and initiate Augmentin  with plan to follow-up without local dentist or her own in [place].  No evidence of [place] angina or significant deep space infection by clinical exam or history.  No trismus.  No fever.  Alert oriented.  Patient's presentation is most consistent with acute, uncomplicated illness.   I will prescribe the patient a narcotic pain medicine due to their condition which I anticipate will cause at least moderate pain short term. I discussed with the patient safe use of narcotic pain medicines, and that they are not to drive,  or ever take more than prescribed (no more than 1 to 2 pill every 6 hours). We discussed that this is the type of medication that can be  overdosed on and the risks of this type of medicine. Patient is very agreeable to only use as prescribed and to never use more than prescribed, has previously used hydrocodone  as well with good effect.   Return precautions and treatment recommendations and follow-up discussed with the patient who is agreeable with the plan.  Discussed customary return precautions around dental pain and concern for potential dental abscess.  Patient very agreeable.  She did not drive herself here, advised that she has someone driving her home.           FINAL CLINICAL IMPRESSION(S) / ED DIAGNOSES   Final diagnoses:  Pain, dental  Dental caries     Rx / DC Orders   ED Discharge Orders          Ordered    HYDROcodone -acetaminophen  (NORCO/VICODIN) 5-325 MG tablet  Every 6 hours PRN        05/09/24 1942    amoxicillin -clavulanate (AUGMENTIN ) 875-125 MG tablet  2 times daily        05/09/24 1942             Note:  This document was prepared using Dragon voice recognition software and may include unintentional dictation errors.   Dicky Anes,  MD 05/09/24 2012

## 2024-05-09 NOTE — ED Triage Notes (Signed)
 Pt presents to the ED via POV with complaints of R sided dental pain x 1 day. She notes breaking the tooth and has had some significant pain in that area since. She has tried OTC pain meds without relief. Rates the pain 10/10. A&Ox4 at this time. Denies fevers, chills, CP or SOB.

## 2024-05-09 NOTE — Discharge Instructions (Signed)
 No driving this evening or within 8 hours of use of hydrocodone .   OPTIONS FOR DENTAL FOLLOW UP CARE  [place] Department of Health and Human Services - Local Safety Net Dental Clinics [email or web address]   [facility] [phone])  [facility] [phone])  [place] [phone])  [facility] [phone])  [facility] [phone]) This clinic caters to the indigent population and is on a lottery system. Location: [employer] of Dentistry, [employer], [address], [place] Clinic Hours: Wednesdays from 6pm - 9pm, patients seen by a lottery system. For dates, call or go to [email or web address] Services: Cleanings, fillings and simple extractions. Payment Options: DENTAL WORK IS FREE OF CHARGE. Bring proof of income or support. Best way to get seen: Arrive at 5:15 pm - this is a lottery, NOT first come/first serve, so arriving earlier will not increase your chances of being seen.     [facility] [phone] Select option 1 for emergencies   Location: [facility], [place], [address] [place] Clinic Hours: No walk-ins accepted - call the day before to schedule an appointment. Check in times are 9:30 am and 1:30 pm. Services: Simple extractions, temporary fillings, pulpectomy/pulp debridement, uncomplicated abscess drainage. Payment Options: PAYMENT IS DUE AT THE TIME OF SERVICE.  Fee is usually $100-200, additional surgical procedures (e.g. abscess drainage) may be extra. Cash, checks, Visa/MasterCard accepted.  Can file Medicaid if patient is covered for dental - patient should call case worker to check. No discount for [facility] patients. Best way to get seen: MUST call the day before and get onto the schedule. Can usually be seen the next 1-2 days. No walk-ins  accepted.     [facility] [phone]   Location: [facility], [address], [place] Clinic Hours: M, W, Th, F 8am or 1:30pm, Tues 9a or 1:30 - first come/first served. Services: Simple extractions, temporary fillings, uncomplicated abscess drainage.  You do not need to be an [facility] resident. Payment Options: PAYMENT IS DUE AT THE TIME OF SERVICE. Dental insurance, otherwise sliding scale - bring proof of income or support. Depending on income and treatment needed, cost is usually $50-200. Best way to get seen: Arrive early as it is first come/first served.     [facility] [phone]   Location: [address] Clinic Hours: Mon-Thu 8a-5p Services: Most basic dental services including extractions and fillings. Payment Options: PAYMENT IS DUE AT THE TIME OF SERVICE. Sliding scale, up to 50% off - bring proof if income or support. Medicaid with dental option accepted. Best way to get seen: Call to schedule an appointment, can usually be seen within 2 weeks OR they will try to see walk-ins - show up at 8a or 2p (you may have to wait).     [facility] [phone] [place] RESIDENTS ONLY   Location: [facility], [address] [place], [place] [address] Clinic Hours: By appointment only. Monday - Thursday 8am-5pm, Friday 8am-12pm Services: Cleanings, fillings, extractions. Payment Options: PAYMENT IS DUE AT THE TIME OF SERVICE. Cash, Visa or MasterCard. Sliding scale - $30 minimum per service. Best way to get seen: Come in to office, complete packet and make an appointment - need proof of income or support monies for each household member and proof of [facility] residence. Usually takes about a month to get in.  [facility] [phone]   Location: [address] [facility] Clinic Hours: Walk-in  Urgent Care Dental Services are offered Monday-Friday mornings only. The numbers of emergencies accepted daily is limited to the number of providers available. Maximum 15 - Mondays, Wednesdays & Thursdays Maximum 10 - Tuesdays & Fridays Services: You do not need to be a [facility] resident to be seen for a dental emergency. Emergencies are defined as pain, swelling, abnormal bleeding, or dental trauma. Walkins will receive x-rays if needed. NOTE: Dental cleaning is not an emergency. Payment Options: PAYMENT IS DUE AT THE TIME OF SERVICE. Minimum co-pay is $40.00 for uninsured patients. Minimum co-pay is $3.00 for Medicaid with dental coverage. Dental Insurance is accepted and must be presented at time of visit. Medicare does not cover dental. Forms of payment: Cash, credit card, checks. Best way to get seen: If not previously registered with the clinic, walk-in dental registration begins at 7:15 am and is on a first come/first serve basis. If previously registered with the clinic, call to make an appointment.     [facility] [phone] [place] RESIDENTS ONLY   Location: [address], [place], [place] Clinic Hours: Mon-Thu 10a-2p Services: Extractions only! Payment Options: FREE (donations accepted) - bring proof of income or support Best way to get seen: Call and schedule an appointment OR come at 8am on the 1st Monday of every month (except for holidays) when it is first come/first served.     [facility] [phone]   Location: [address] [place], [place] Clinic Hours: Friday mornings Services, Payment Options, Best way to get seen: Call for info

## 2024-05-15 ENCOUNTER — Other Ambulatory Visit (INDEPENDENT_AMBULATORY_CARE_PROVIDER_SITE_OTHER): Payer: Self-pay

## 2024-05-15 ENCOUNTER — Ambulatory Visit (INDEPENDENT_AMBULATORY_CARE_PROVIDER_SITE_OTHER): Payer: Self-pay | Admitting: Orthopedic Surgery

## 2024-05-15 DIAGNOSIS — S82402A Unspecified fracture of shaft of left fibula, initial encounter for closed fracture: Secondary | ICD-10-CM

## 2024-05-15 DIAGNOSIS — S82202A Unspecified fracture of shaft of left tibia, initial encounter for closed fracture: Secondary | ICD-10-CM

## 2024-05-15 NOTE — Progress Notes (Signed)
 Orthopedic Surgery Post-operative Office Visit   Procedure: right tibia intramedullary rodding  Date of Surgery: 09/09/2023 (~8 months post-op)   Assessment: Patient is a 28 y.o. who is doing well after surgery     Plan: -Operative plans complete -Weightbearing as tolerated, no activity restrictions -If she still would like the rod out at 1 year from surgery, would consider doing it but it may not help with the specific pain that she is having when running after her kids.  There also would be risk for complication which I went over with her today -Return to office in 4 months, x-rays needed at next visit: right tibia AP and lateral   ___________________________________________________________________________     Subjective: Patient is doing well at this point.  She said she is back to full duty at work.  She does not have any consistent pain in her leg.  She sometimes has pain when she is running or chasing after her kids.  She feels it in her knee when she is doing that.  She can speed walk behind them without any issue.  She is pleased with how she is doing at this point.  However, she is considering getting the nail out because she does want to be ale run after her kids without pain.   Objective:   General: no acute distress, appropriate affect Neurologic: alert, answering questions appropriately, following commands Respiratory: unlabored breathing on room air Skin: incision are well healed   MSK (RLE): nonantalgic gait, ambulates without assistive devices,knee ROM from 5-100, EHL/TA/GSC intact, sensation intact to light touch in sural/saphenous/deep peroneal/superficial peroneal/tibial nerve distributions, foot warm and well-perfused   Imaging: X-rays of the tibia taken 05/15/2024 were independently reviewed and interpreted, showing intramedullary rod within the tibia.  There is callus formation noted in all 4 cortices of the distal one third of the tibia.  No lucency seen around the  rod or any of the interlocking screws. No new fractures seen.      Patient name: Pamela Jefferson Patient MRN: [id number] Date of visit: 05/15/24

## 2024-07-22 ENCOUNTER — Encounter: Payer: Self-pay | Admitting: Radiology

## 2024-08-22 ENCOUNTER — Ambulatory Visit: Admitting: Orthopedic Surgery

## 2024-09-16 ENCOUNTER — Other Ambulatory Visit: Payer: Self-pay

## 2024-09-16 ENCOUNTER — Ambulatory Visit (INDEPENDENT_AMBULATORY_CARE_PROVIDER_SITE_OTHER): Payer: Self-pay | Admitting: Orthopedic Surgery

## 2024-09-16 DIAGNOSIS — S82402A Unspecified fracture of shaft of left fibula, initial encounter for closed fracture: Secondary | ICD-10-CM

## 2024-09-16 DIAGNOSIS — S82202A Unspecified fracture of shaft of left tibia, initial encounter for closed fracture: Secondary | ICD-10-CM

## 2024-09-16 NOTE — Progress Notes (Signed)
 Orthopedic Surgery Post-operative Office Visit   Procedure: right tibia intramedullary rodding  Date of Surgery: 09/09/2023 (~1 year post-op)   Assessment: Patient is a 28 y.o. who has noticed pain around the midshaft tibia with the change in the weather     Plan: -Operative plans complete -Weightbearing as tolerated, no activity restrictions -Patient has noticed pain around the midshaft of her tibia.  She is no longer having the knee pain.  She feels that the pain started when the weather starting colder.  She is interested in getting the rod out.  She feels that this is contributing to her pain.  I explained to her that even with rod removal there is a chance that she still has pain.  She has significant callus formation so I think it would be okay to remove the rod with limited risk for recurrent fracture -Patient will next be seen a date of surgery  I covered the risks, benefits, alternatives of right tibia intramedullary rod removal.  The risks of the surgery included but were not limited to: intraoperative fracture or postoperative fracture, neurovascular injury, need for additional procedures, infection, bleeding, stiffness, patellar tendon or quadriceps tendon injury, persistent pain, DVT/PE, death.  I told her she would likely need to use crutches or a walker after the surgery to offload the area.  I also told her that I would plan to do this via an infrapatellar incision to avoid backing the rod out underneath her patella.  If I cannot get it out with the infrapatellar incision, I will plan to remove it via her prior suprapatellar incision. The alternative would be to use over-the-counter medications and topical medications to help with the pain.  She has tried these and they have not helped.  The other alternative would be to wait longer or continue to tolerate her symptoms.  The benefit of the surgery would be possible improvement in her pain.  Her pain is not around the knee anymore and it  is near the midshaft of the tibia so even with rod removal she still may have pain. I do not think this is a nonunion given the callus formation seen around all 4 cortices.    ___________________________________________________________________________     Subjective: Patient has noticed pain around the midshaft of her tibia since the weather has gotten colder.  She is no longer having any knee pain.  She has had to decrease the amount of work that she is doing and has called out because of the pain.  She has been using Tylenol  and ibuprofen  but those are no longer helping.  She notes the pain is worse when she is weightbearing and improves if she rests.  She is interested in having this rod removed.  She said she does not wanted in her body.   Objective:   General: no acute distress, appropriate affect Neurologic: alert, answering questions appropriately, following commands Respiratory: unlabored breathing on room air Skin: incisions are well healed   MSK (RLE): nonantalgic gait, ambulates without assistive devices,knee ROM from 0-105, EHL/TA/GSC intact, sensation intact to light touch in sural/saphenous/deep peroneal/superficial peroneal/tibial nerve distributions, foot warm and well-perfused   Imaging: XRs of the right tibia from 09/16/2024 were independently reviewed and interpreted, showing intramedullary rod in place in the tibia.  No lucency seen around the rod.  No lucency seen around the proximal or distal interlocking screws.  There is callus formation seen around all 4 cortices at the prior fracture site.  No new fracture seen.  No dislocation seen.     Patient name: Pamela Jefferson Patient MRN: [id number] Date of visit: 09/16/2024

## 2024-10-12 ENCOUNTER — Emergency Department
Admission: EM | Admit: 2024-10-12 | Discharge: 2024-10-12 | Disposition: A | Discharge status: Home / Self Care | Payer: Self-pay | Attending: Emergency Medicine | Admitting: Emergency Medicine

## 2024-10-12 ENCOUNTER — Other Ambulatory Visit: Payer: Self-pay

## 2024-10-12 DIAGNOSIS — K047 Periapical abscess without sinus: Secondary | ICD-10-CM | POA: Insufficient documentation

## 2024-10-12 DIAGNOSIS — X58XXXA Exposure to other specified factors, initial encounter: Secondary | ICD-10-CM | POA: Insufficient documentation

## 2024-10-12 DIAGNOSIS — S025XXA Fracture of tooth (traumatic), initial encounter for closed fracture: Secondary | ICD-10-CM | POA: Insufficient documentation

## 2024-10-12 MED ORDER — OXYCODONE-ACETAMINOPHEN 5-325 MG PO TABS: 1.0000 | ORAL_TABLET | ORAL | 0 refills | Status: AC | PRN

## 2024-10-12 MED ORDER — OXYCODONE-ACETAMINOPHEN 5-325 MG PO TABS
1.0000 | ORAL_TABLET | Freq: Once | ORAL | Status: AC
  Administered 2024-10-12: 1 via ORAL
  Filled 2024-10-12: qty 1

## 2024-10-12 MED ORDER — LIDOCAINE-EPINEPHRINE 2 %-1:100000 IJ SOLN
1.7000 mL | Freq: Once | INTRAMUSCULAR | Status: AC
  Administered 2024-10-12: 1.7 mL via INTRADERMAL
  Filled 2024-10-12: qty 1

## 2024-10-12 MED ORDER — AMOXICILLIN-POT CLAVULANATE 875-125 MG PO TABS
1.0000 | ORAL_TABLET | Freq: Once | ORAL | Status: AC
  Administered 2024-10-12: 1 via ORAL
  Filled 2024-10-12: qty 1

## 2024-10-12 MED ORDER — AMOXICILLIN-POT CLAVULANATE 875-125 MG PO TABS: 1.0000 | ORAL_TABLET | Freq: Two times a day (BID) | ORAL | 0 refills | Status: AC

## 2024-10-12 MED ORDER — LIDOCAINE-EPINEPHRINE 1 %-1:100000 IJ SOLN
1.7000 mL | INTRAMUSCULAR | Status: DC
  Administered 2024-10-12: 1.7 mL

## 2024-10-12 NOTE — ED Triage Notes (Addendum)
 Pt c/o dental pain and swelling since yesterday. Lower right cheek/jaw appears visibly swollen, chipped molar noted on right lower side. Pt is CAOX4, c/o tenderness. Airway is patent.

## 2024-10-12 NOTE — ED Provider Notes (Signed)
 "  [facility] Provider Note   Event Date/Time   First MD Initiated Contact with Patient 10/12/24 1823     (approximate) History  Oral Swelling  HPI Pamela Jefferson is a 29 y.o. female with no stated past medical history who presents complaining of right mandibular pain with associated swelling after cracking a molar over the last few days.  Patient states that she only noticed the swelling yesterday into today and has had difficulty eating because of it.  Patient denies any subjective fevers. ROS: Patient currently denies any vision changes, tinnitus, difficulty speaking, facial droop, sore throat, chest pain, shortness of breath, abdominal pain, nausea/vomiting/diarrhea, dysuria, or weakness/numbness/paresthesias in any extremity   Physical Exam  Triage Vital Signs: ED Triage Vitals  Encounter Vitals Group     BP 10/12/24 1801 (!) 134/92     Girls Systolic BP Percentile --      Girls Diastolic BP Percentile --      Boys Systolic BP Percentile --      Boys Diastolic BP Percentile --      Pulse Rate 10/12/24 1801 98     Resp 10/12/24 1801 20     Temp 10/12/24 1801 98.8 F (37.1 C)     Temp Source 10/12/24 1801 Oral     SpO2 10/12/24 1801 99 %     Weight 10/12/24 1801 238 lb (108 kg)     Height 10/12/24 1806 5' 8 (1.727 m)     Head Circumference --      Peak Flow --      Pain Score 10/12/24 1806 8     Pain Loc --      Pain Education --      Exclude from Growth Chart --    Most recent vital signs: Vitals:   10/12/24 1801  BP: (!) 134/92  Pulse: 98  Resp: 20  Temp: 98.8 F (37.1 C)  SpO2: 99%   General: Awake, oriented x4. CV:  Good peripheral perfusion. Resp:  Normal effort. Abd:  No distention. Other:  Patient is a young adult who obese African-American female resting comfortably in no acute distress.  Swelling noted to midline right mandible.  Patient's left second mandibular molar with significant fracture and tenderness to palpation with  surrounding erythema and edema ED Results / Procedures / Treatments  Labs (all labs ordered are listed, but only abnormal results are displayed) Labs Reviewed - No data to display PROCEDURES: Critical Care performed: No Dental Block  Date/Time: 10/12/2024 6:52 PM  Performed by: Jossie Artist POUR, MD Authorized by: Jossie Artist POUR, MD   Consent:    Consent obtained:  Verbal   Consent given by:  Patient   Risks, benefits, and alternatives were discussed: yes     Risks discussed:  Allergic reaction, infection, swelling, nerve damage, hematoma, intravascular injection, unsuccessful block and pain   Alternatives discussed:  No treatment, delayed treatment, alternative treatment and referral Universal protocol:    Immediately prior to procedure, a time out was called: yes     Patient identity confirmed:  Verbally with patient Indications:    Indications: dentoalveolar trauma   Location:    Block type:  Supraperiosteal   Supraperiosteal location:  Lower teeth   Lower teeth location:  18/LL 2nd molar Procedure details:    Needle gauge:  27 G   Anesthetic injected:  1.7 mL lidocaine -EPINEPHrine  1 %-1:100000   Injection procedure:  Anatomic landmarks identified, negative aspiration for blood, introduced needle, incremental injection and  anatomic landmarks palpated Post-procedure details:    Outcome:  Pain relieved   Procedure completion:  Tolerated well, no immediate complications  MEDICATIONS ORDERED IN ED: Medications  oxyCODONE -acetaminophen  (PERCOCET/ROXICET) 5-325 MG per tablet 1 tablet (has no administration in time range)  lidocaine -EPINEPHrine  (XYLOCAINE  W/EPI) 2 %-1:100000 (with pres) injection 1.7 mL (has no administration in time range)  amoxicillin -clavulanate (AUGMENTIN ) 875-125 MG per tablet 1 tablet (has no administration in time range)   IMPRESSION / MDM / ASSESSMENT AND PLAN / ED COURSE  I reviewed the triage vital signs and the nursing notes.                              The patient is on the cardiac monitor to evaluate for evidence of arrhythmia and/or significant heart rate changes. Patient's presentation is most consistent with acute presentation with potential threat to life or bodily function. Patient is a 29 year old female with the above-stated past medical history who presents complaining of of right mandibular tooth pain after a crack in this tooth recently Patient not immunosuppressed. No e/o tooth fracture, avulsion, or bleeding socket. No e/o RPA, PTA, Ludwig's angina, periapical abscess. No e/o gingival hyperplasia or concern for drug reaction. Placed dental cement over cracked tooth at bedside Rx Ibuprofen , hydrocodone , Augmentin  Disposition: Discharge home. Discussed return precautions for odontogenic infections and other dental pain emergencies. Will provide dental clinic list.   FINAL CLINICAL IMPRESSION(S) / ED DIAGNOSES   Final diagnoses:  Closed fracture of tooth, initial encounter  Dental infection   Rx / DC Orders   ED Discharge Orders          Ordered    oxyCODONE -acetaminophen  (PERCOCET) 5-325 MG tablet  Every 4 hours PRN        10/12/24 1852    amoxicillin -clavulanate (AUGMENTIN ) 875-125 MG tablet  2 times daily        10/12/24 1852           Note:  This document was prepared using Dragon voice recognition software and may include unintentional dictation errors.   Jossie Artist POUR, MD 10/12/24 [phone]  "

## 2024-10-12 NOTE — ED Notes (Signed)
Patient verbalizes understanding of discharge instructions. Opportunity for questioning and answers were provided. Armband removed by staff, pt discharged from ED. Ambulated out to lobby  

## 2024-10-15 ENCOUNTER — Emergency Department
Admission: EM | Admit: 2024-10-15 | Discharge: 2024-10-15 | Disposition: A | Discharge status: Home / Self Care | Payer: Self-pay | Attending: Emergency Medicine | Admitting: Emergency Medicine

## 2024-10-15 ENCOUNTER — Other Ambulatory Visit: Payer: Self-pay

## 2024-10-15 ENCOUNTER — Encounter: Payer: Self-pay | Admitting: Emergency Medicine

## 2024-10-15 DIAGNOSIS — K047 Periapical abscess without sinus: Secondary | ICD-10-CM | POA: Insufficient documentation

## 2024-10-15 MED ORDER — LIDOCAINE-EPINEPHRINE (PF) 2 %-1:200000 IJ SOLN
1.0000 mL | INTRAMUSCULAR | Status: DC
  Administered 2024-10-15: 1 mL

## 2024-10-15 MED ORDER — LIDOCAINE-EPINEPHRINE 2 %-1:100000 IJ SOLN
1.7000 mL | Freq: Once | INTRAMUSCULAR | Status: DC
  Filled 2024-10-15: qty 1.7

## 2024-10-15 MED ORDER — HYDROCODONE-ACETAMINOPHEN 5-325 MG PO TABS: 1.0000 | ORAL_TABLET | Freq: Three times a day (TID) | ORAL | 0 refills | Status: AC | PRN

## 2024-10-15 NOTE — ED Provider Notes (Signed)
 "   [facility] Emergency Department Provider Note     Event Date/Time   First MD Initiated Contact with Patient 10/15/24 1232     (approximate)   History   Dental Problem   HPI  Pamela Jefferson is a 29 y.o. female with a history of anxiety, depression, and gastric ulcer, presents to the ED endorsing focal swelling to the right lower jaw.  Patient was seen here 3 days ago, received a local injection of lidocaine  for pain relief secondary to dental caries and an acute dental fracture.  She has been on the Augmentin  as prescribed.  She denies any interim fever, chills, sweats.  She reports acute, spontaneous swelling to the right lower jaw.  No difficulty breathing, swallowing, or controlling oral secretions.     Physical Exam   Triage Vital Signs: ED Triage Vitals  Encounter Vitals Group     BP 10/15/24 1222 (!) 135/93     Girls Systolic BP Percentile --      Girls Diastolic BP Percentile --      Boys Systolic BP Percentile --      Boys Diastolic BP Percentile --      Pulse Rate 10/15/24 1221 86     Resp 10/15/24 1221 17     Temp 10/15/24 1221 98.8 F (37.1 C)     Temp Source 10/15/24 1221 Oral     SpO2 10/15/24 1221 98 %     Weight 10/15/24 1221 238 lb 1.6 oz (108 kg)     Height 10/15/24 1221 5' 8 (1.727 m)     Head Circumference --      Peak Flow --      Pain Score 10/15/24 1221 9     Pain Loc --      Pain Education --      Exclude from Growth Chart --     Most recent vital signs: Vitals:   10/15/24 1221 10/15/24 1222  BP:  (!) 135/93  Pulse: 86   Resp: 17   Temp: 98.8 F (37.1 C)   SpO2: 98%     General Awake, no distress. NAD HEENT NCAT. PERRL. EOMI. No rhinorrhea. Mucous membranes are moist.  Patient with a area of focal fluctuant pus collection near the primary Premolar of the right lower jaw.  No brawny sublingual edema is noted.  No focal gum erythema is appreciated. CV:  Good peripheral perfusion.  RESP:  Normal effort.     ED Results / Procedures / Treatments   Labs (all labs ordered are listed, but only abnormal results are displayed) Labs Reviewed - No data to display   EKG   RADIOLOGY  No results found.   PROCEDURES:  Critical Care performed: No  .Incision and Drainage  Date/Time: 10/15/2024 1:11 PM  Performed by: Loyd Candida LULLA Aldona, PA-C Authorized by: Loyd Candida LULLA Aldona, PA-C   Consent:    Consent obtained:  Verbal   Consent given by:  Patient   Risks, benefits, and alternatives were discussed: yes     Risks discussed:  Incomplete drainage, pain and bleeding   Alternatives discussed:  Delayed treatment and alternative treatment Universal protocol:    Site/side marked: yes     Patient identity confirmed:  Verbally with patient Location:    Type:  Abscess   Size:  2   Location:  Mouth   Mouth location:  Alveolar process Sedation:    Sedation type:  None Anesthesia:    Anesthesia method:  Local infiltration   Local anesthetic:  1 mL lidocaine -EPINEPHrine  2 %-1:200000 Procedure type:    Complexity:  Simple Procedure details:    Ultrasound guidance: no     Needle aspiration: no     Incision types:  Stab incision   Incision depth:  Subcutaneous   Drainage:  Purulent   Drainage amount:  Moderate   Wound treatment:  Wound left open   Packing materials:  None Post-procedure details:    Procedure completion:  Tolerated well, no immediate complications    MEDICATIONS ORDERED IN ED: Medications  lidocaine -EPINEPHrine  (XYLOCAINE  W/EPI) 2 %-1:100000 (with pres) injection 1.7 mL (has no administration in time range)     IMPRESSION / MDM / ASSESSMENT AND PLAN / ED COURSE  I reviewed the triage vital signs and the nursing notes.                              Differential diagnosis includes, but is not limited to, dental infection, dental caries, dental fracture  Patient's presentation is most consistent with acute, uncomplicated illness.  Patient's diagnosis  is consistent with focal dental abscess secondary to dental caries. Patient will be discharged home with prescriptions for hydrocodone  (#9) in addition to the previously prescribed Augmentin . Patient is to follow up with a local dental provider as suggested, as needed or otherwise directed. Patient is given ED precautions to return to the ED for any worsening or new symptoms.     FINAL CLINICAL IMPRESSION(S) / ED DIAGNOSES   Final diagnoses:  Dental abscess     Rx / DC Orders   ED Discharge Orders          Ordered    HYDROcodone -acetaminophen  (NORCO/VICODIN) 5-325 MG tablet  3 times daily PRN        10/15/24 1309             Note:  This document was prepared using Dragon voice recognition software and may include unintentional dictation errors.    Loyd Candida LULLA Aldona, PA-C 10/15/24 1342    Dorothyann Drivers, MD 10/15/24 1914  "

## 2024-10-15 NOTE — Discharge Instructions (Addendum)
 Take the antibiotic as prescribed.  Take the pain medicine as needed.  Rinse your mouth 3-4 times daily with warm salt water to promote healing.  You must follow-up with your dental provider for definitive management of your dental fracture and dental caries.

## 2024-10-15 NOTE — ED Triage Notes (Signed)
 Pt c/o abscess to right lower gum. Pt seen here 3 days ago for same. Pt said she has been taking abx and pain medication without improvement.

## 2024-10-15 NOTE — ED Notes (Signed)
 See triage note  Presents with cont'd pain and swelling to right lower jaw lin  States she cracked a tooth  States pain and swelling getting wose  Was seen on Saturday for same
